# Patient Record
Sex: Male | Born: 1950 | Race: White | Hispanic: No | Smoking: Former smoker
Health system: Southern US, Community
[De-identification: ages and names within clinical notes are randomized; demographics above are authoritative.]

## PROBLEM LIST (undated history)

## (undated) DIAGNOSIS — E119 Type 2 diabetes mellitus without complications: Secondary | ICD-10-CM

## (undated) DIAGNOSIS — M109 Gout, unspecified: Secondary | ICD-10-CM

## (undated) DIAGNOSIS — M1990 Unspecified osteoarthritis, unspecified site: Secondary | ICD-10-CM

## (undated) DIAGNOSIS — K219 Gastro-esophageal reflux disease without esophagitis: Secondary | ICD-10-CM

## (undated) DIAGNOSIS — J45909 Unspecified asthma, uncomplicated: Secondary | ICD-10-CM

## (undated) DIAGNOSIS — I2510 Atherosclerotic heart disease of native coronary artery without angina pectoris: Secondary | ICD-10-CM

## (undated) DIAGNOSIS — M25511 Pain in right shoulder: Secondary | ICD-10-CM

## (undated) DIAGNOSIS — I219 Acute myocardial infarction, unspecified: Secondary | ICD-10-CM

## (undated) DIAGNOSIS — I10 Essential (primary) hypertension: Secondary | ICD-10-CM

## (undated) DIAGNOSIS — E785 Hyperlipidemia, unspecified: Secondary | ICD-10-CM

## (undated) DIAGNOSIS — I519 Heart disease, unspecified: Secondary | ICD-10-CM

## (undated) DIAGNOSIS — I255 Ischemic cardiomyopathy: Secondary | ICD-10-CM

## (undated) HISTORY — DX: Type 2 diabetes mellitus without complications: E11.9

## (undated) HISTORY — DX: Ischemic cardiomyopathy: I25.5

## (undated) HISTORY — PX: OTHER SURGICAL HISTORY: SHX169

## (undated) HISTORY — DX: Heart disease, unspecified: I51.9

## (undated) HISTORY — DX: Essential (primary) hypertension: I10

## (undated) HISTORY — DX: Pain in right shoulder: M25.511

## (undated) HISTORY — PX: CORONARY STENT PLACEMENT: SHX1402

---

## 1998-12-03 ENCOUNTER — Emergency Department (HOSPITAL_COMMUNITY): Admission: EM | Admit: 1998-12-03 | Discharge: 1998-12-03 | Payer: Self-pay | Admitting: Emergency Medicine

## 1999-07-02 ENCOUNTER — Encounter: Payer: Self-pay | Admitting: *Deleted

## 1999-07-03 ENCOUNTER — Inpatient Hospital Stay (HOSPITAL_COMMUNITY): Admission: EM | Admit: 1999-07-03 | Discharge: 1999-07-06 | Payer: Self-pay | Admitting: *Deleted

## 2000-04-07 ENCOUNTER — Emergency Department (HOSPITAL_COMMUNITY): Admission: EM | Admit: 2000-04-07 | Discharge: 2000-04-07 | Payer: Self-pay | Admitting: Emergency Medicine

## 2000-04-07 ENCOUNTER — Encounter: Payer: Self-pay | Admitting: Emergency Medicine

## 2000-05-23 ENCOUNTER — Inpatient Hospital Stay (HOSPITAL_COMMUNITY): Admission: EM | Admit: 2000-05-23 | Discharge: 2000-05-24 | Payer: Self-pay | Admitting: Emergency Medicine

## 2000-05-23 ENCOUNTER — Encounter: Payer: Self-pay | Admitting: Emergency Medicine

## 2000-05-24 ENCOUNTER — Encounter: Payer: Self-pay | Admitting: Cardiology

## 2001-03-16 ENCOUNTER — Emergency Department (HOSPITAL_COMMUNITY): Admission: EM | Admit: 2001-03-16 | Discharge: 2001-03-16 | Payer: Self-pay | Admitting: Emergency Medicine

## 2001-08-23 ENCOUNTER — Ambulatory Visit (HOSPITAL_COMMUNITY): Admission: RE | Admit: 2001-08-23 | Discharge: 2001-08-23 | Payer: Self-pay | Admitting: Cardiology

## 2001-08-23 ENCOUNTER — Encounter: Payer: Self-pay | Admitting: Cardiology

## 2003-09-11 ENCOUNTER — Emergency Department (HOSPITAL_COMMUNITY): Admission: EM | Admit: 2003-09-11 | Discharge: 2003-09-11 | Payer: Self-pay | Admitting: Emergency Medicine

## 2003-11-06 ENCOUNTER — Observation Stay (HOSPITAL_COMMUNITY): Admission: EM | Admit: 2003-11-06 | Discharge: 2003-11-07 | Payer: Self-pay | Admitting: Emergency Medicine

## 2003-12-31 ENCOUNTER — Ambulatory Visit (HOSPITAL_COMMUNITY): Admission: RE | Admit: 2003-12-31 | Discharge: 2003-12-31 | Payer: Self-pay | Admitting: Cardiology

## 2004-04-01 ENCOUNTER — Emergency Department (HOSPITAL_COMMUNITY): Admission: EM | Admit: 2004-04-01 | Discharge: 2004-04-02 | Payer: Self-pay | Admitting: Emergency Medicine

## 2006-06-24 ENCOUNTER — Emergency Department (HOSPITAL_COMMUNITY): Admission: EM | Admit: 2006-06-24 | Discharge: 2006-06-24 | Payer: Self-pay | Admitting: Emergency Medicine

## 2008-03-20 ENCOUNTER — Emergency Department (HOSPITAL_COMMUNITY): Admission: EM | Admit: 2008-03-20 | Discharge: 2008-03-20 | Payer: Self-pay | Admitting: Emergency Medicine

## 2008-03-28 ENCOUNTER — Ambulatory Visit (HOSPITAL_COMMUNITY): Admission: RE | Admit: 2008-03-28 | Discharge: 2008-03-28 | Payer: Self-pay | Admitting: Chiropractic Medicine

## 2008-07-21 ENCOUNTER — Ambulatory Visit (HOSPITAL_COMMUNITY): Admission: RE | Admit: 2008-07-21 | Discharge: 2008-07-21 | Payer: Self-pay | Admitting: Cardiology

## 2010-04-27 ENCOUNTER — Encounter (INDEPENDENT_AMBULATORY_CARE_PROVIDER_SITE_OTHER): Payer: Self-pay | Admitting: *Deleted

## 2010-05-26 ENCOUNTER — Encounter (INDEPENDENT_AMBULATORY_CARE_PROVIDER_SITE_OTHER): Payer: Self-pay | Admitting: *Deleted

## 2010-11-16 NOTE — Letter (Signed)
Summary: Previsit letter  [facility]  [address] [place], [place] [address]   Phone: [phone]  Fax: [phone]       04/27/2010 MRN: [id number]  [facility] [address] [place], [place]  [address]  Dear Mr. Peter Booker,  Welcome to the Gastroenterology Division at [facility].    You are scheduled to see a nurse for your pre-procedure visit on 05-26-10 at 3:30P.M. on the 3rd floor at [facility], [address]  We ask that you try to arrive at our office 15 minutes prior to your appointment time to allow for check-in.  Your nurse visit will consist of discussing your medical and surgical history, your immediate family medical history, and your medications.    Please bring a complete list of all your medications or, if you prefer, bring the medication bottles and we will list them.  We will need to be aware of both prescribed and over the counter drugs.  We will need to know exact dosage information as well.  If you are on blood thinners (Coumadin, Plavix, Aggrenox, Ticlid, etc.) please call our office today/prior to your appointment, as we need to consult with your physician about holding your medication.   Please be prepared to read and sign documents such as consent forms, a financial agreement, and acknowledgement forms.  If necessary, and with your consent, a friend or relative is welcome to sit-in on the nurse visit with you.  Please bring your insurance card so that we may make a copy of it.  If your insurance requires a referral to see a specialist, please bring your referral form from your primary care physician.  No co-pay is required for this nurse visit.     If you cannot keep your appointment, please call [phone] to cancel or reschedule prior to your appointment date.  This allows [place] the opportunity to schedule an appointment for another patient in need of care.    Thank you for choosing [facility] for your medical  needs.  We appreciate the opportunity to care for you.  Please visit [place] at our website  to learn more about our practice.                     Sincerely.                                                                                                                   The Gastroenterology Division

## 2010-11-16 NOTE — Letter (Signed)
Summary: Pre Visit No Show Letter  [facility]  [address] [place], [place] [address]   Phone: [phone]  Fax: [phone]        May 26, 2010 MRN: [id number]    [facility] [address] [place], [place]  [address]    Dear Mr. SHULER,   We have been unable to reach you by phone concerning the pre-procedure visit that you missed on 05/26/2010. For this reason,your procedure scheduled on 06/09/2010 has been cancelled. Our scheduling staff will gladly assist you with rescheduling your appointments at a more convenient time. Please call our office at [phone] between the hours of 8:00am and 5:00pm, press option #2 to reach an appointment scheduler. Please consider updating your contact numbers at this time so that we can reach you by phone in the future with schedule changes or results.    Thank you,    Ezra Sites RN [facility]

## 2010-12-29 ENCOUNTER — Emergency Department (HOSPITAL_BASED_OUTPATIENT_CLINIC_OR_DEPARTMENT_OTHER)
Admission: EM | Admit: 2010-12-29 | Discharge: 2010-12-30 | Disposition: A | Discharge status: Other Acute Inpatient Hospital | Payer: Medicaid Other | Attending: Emergency Medicine | Admitting: Emergency Medicine

## 2010-12-29 ENCOUNTER — Emergency Department (HOSPITAL_COMMUNITY)
Admission: EM | Admit: 2010-12-29 | Discharge: 2010-12-29 | Disposition: A | Discharge status: Home / Self Care | Payer: Medicaid Other | Attending: Emergency Medicine | Admitting: Emergency Medicine

## 2010-12-29 ENCOUNTER — Emergency Department (HOSPITAL_COMMUNITY): Payer: Medicaid Other

## 2010-12-29 ENCOUNTER — Emergency Department (INDEPENDENT_AMBULATORY_CARE_PROVIDER_SITE_OTHER): Payer: Medicaid Other

## 2010-12-29 DIAGNOSIS — R209 Unspecified disturbances of skin sensation: Secondary | ICD-10-CM | POA: Insufficient documentation

## 2010-12-29 DIAGNOSIS — R55 Syncope and collapse: Secondary | ICD-10-CM | POA: Insufficient documentation

## 2010-12-29 DIAGNOSIS — Z79899 Other long term (current) drug therapy: Secondary | ICD-10-CM | POA: Insufficient documentation

## 2010-12-29 DIAGNOSIS — M545 Low back pain, unspecified: Secondary | ICD-10-CM | POA: Insufficient documentation

## 2010-12-29 DIAGNOSIS — E7800 Pure hypercholesterolemia, unspecified: Secondary | ICD-10-CM | POA: Insufficient documentation

## 2010-12-29 DIAGNOSIS — F17200 Nicotine dependence, unspecified, uncomplicated: Secondary | ICD-10-CM

## 2010-12-29 DIAGNOSIS — Z9861 Coronary angioplasty status: Secondary | ICD-10-CM

## 2010-12-29 DIAGNOSIS — I2510 Atherosclerotic heart disease of native coronary artery without angina pectoris: Secondary | ICD-10-CM | POA: Insufficient documentation

## 2010-12-29 DIAGNOSIS — M542 Cervicalgia: Secondary | ICD-10-CM | POA: Insufficient documentation

## 2010-12-29 DIAGNOSIS — R112 Nausea with vomiting, unspecified: Secondary | ICD-10-CM | POA: Insufficient documentation

## 2010-12-29 LAB — CBC
Hemoglobin: 15 g/dL (ref 13.0–17.0)
MCH: 33.3 pg (ref 26.0–34.0)
MCHC: 36.5 g/dL — ABNORMAL HIGH (ref 30.0–36.0)
Platelets: 271 10*3/uL (ref 150–400)

## 2010-12-29 LAB — BASIC METABOLIC PANEL
CO2: 22 mEq/L (ref 19–32)
Calcium: 9.3 mg/dL (ref 8.4–10.5)
Creatinine, Ser: 0.8 mg/dL (ref 0.4–1.5)
GFR calc Af Amer: >60 mL/min (ref >60)

## 2010-12-29 LAB — PROTIME-INR: INR: 0.98 (ref 0.00–1.49)

## 2010-12-29 LAB — POCT CARDIAC MARKERS: Myoglobin, poc: 50.6 ng/mL (ref 12–200)

## 2010-12-30 ENCOUNTER — Inpatient Hospital Stay (HOSPITAL_COMMUNITY): Payer: Medicaid Other

## 2010-12-30 ENCOUNTER — Inpatient Hospital Stay (HOSPITAL_COMMUNITY)
Admission: AD | Admit: 2010-12-30 | Discharge: 2010-12-31 | DRG: 303 | Disposition: A | Discharge status: Home / Self Care | Payer: Medicaid Other | Source: Other Acute Inpatient Hospital | Attending: Cardiology | Admitting: Cardiology

## 2010-12-30 DIAGNOSIS — I2510 Atherosclerotic heart disease of native coronary artery without angina pectoris: Principal | ICD-10-CM | POA: Diagnosis present

## 2010-12-30 DIAGNOSIS — E7800 Pure hypercholesterolemia, unspecified: Secondary | ICD-10-CM | POA: Diagnosis present

## 2010-12-30 DIAGNOSIS — R7309 Other abnormal glucose: Secondary | ICD-10-CM | POA: Diagnosis present

## 2010-12-30 DIAGNOSIS — Z7982 Long term (current) use of aspirin: Secondary | ICD-10-CM

## 2010-12-30 DIAGNOSIS — Z982 Presence of cerebrospinal fluid drainage device: Secondary | ICD-10-CM

## 2010-12-30 DIAGNOSIS — Z8249 Family history of ischemic heart disease and other diseases of the circulatory system: Secondary | ICD-10-CM

## 2010-12-30 DIAGNOSIS — F17200 Nicotine dependence, unspecified, uncomplicated: Secondary | ICD-10-CM | POA: Diagnosis present

## 2010-12-30 LAB — CARDIAC PANEL(CRET KIN+CKTOT+MB+TROPI)
CK, MB: 2.9 ng/mL (ref 0.3–4.0)
Relative Index: INVALID (ref 0.0–2.5)
Relative Index: 2.9 — ABNORMAL HIGH (ref 0.0–2.5)
Total CK: 112 U/L (ref 7–232)
Troponin I: 0.02 ng/mL (ref 0.00–0.06)
Troponin I: 0.01 ng/mL (ref 0.00–0.06)

## 2010-12-30 LAB — APTT: aPTT: 50 seconds — ABNORMAL HIGH (ref 24–37)

## 2010-12-30 LAB — CBC
HCT: 41.8 % (ref 39.0–52.0)
Hemoglobin: 14.3 g/dL (ref 13.0–17.0)
MCHC: 34.2 g/dL (ref 30.0–36.0)
MCV: 96.1 fL (ref 78.0–100.0)
Platelets: 234 10*3/uL (ref 150–400)
RBC: 3.89 MIL/uL — ABNORMAL LOW (ref 4.22–5.81)
RBC: 4.33 MIL/uL (ref 4.22–5.81)
WBC: 10.1 10*3/uL (ref 4.0–10.5)
WBC: 10.1 10*3/uL (ref 4.0–10.5)

## 2010-12-30 LAB — GLUCOSE, CAPILLARY
Glucose-Capillary: 132 mg/dL — ABNORMAL HIGH (ref 70–99)
Glucose-Capillary: 117 mg/dL — ABNORMAL HIGH (ref 70–99)

## 2010-12-30 LAB — PROTIME-INR
INR: 1.09 (ref 0.00–1.49)
Prothrombin Time: 14.3 seconds (ref 11.6–15.2)

## 2010-12-30 LAB — COMPREHENSIVE METABOLIC PANEL
BUN: 15 mg/dL (ref 6–23)
CO2: 24 mEq/L (ref 19–32)
Calcium: 8.2 mg/dL — ABNORMAL LOW (ref 8.4–10.5)
Creatinine, Ser: 0.74 mg/dL (ref 0.4–1.5)
GFR calc non Af Amer: >60 mL/min (ref >60)
Glucose, Bld: 117 mg/dL — ABNORMAL HIGH (ref 70–99)
Total Bilirubin: 0.4 mg/dL (ref 0.3–1.2)

## 2010-12-30 LAB — LIPID PANEL
Cholesterol: 148 mg/dL (ref 0–200)
HDL: 48 mg/dL (ref >39)
Triglycerides: 115 mg/dL (ref <150)

## 2010-12-31 ENCOUNTER — Inpatient Hospital Stay (HOSPITAL_COMMUNITY): Payer: Medicaid Other

## 2010-12-31 LAB — GLUCOSE, CAPILLARY
Glucose-Capillary: 104 mg/dL — ABNORMAL HIGH (ref 70–99)
Glucose-Capillary: 122 mg/dL — ABNORMAL HIGH (ref 70–99)

## 2010-12-31 LAB — HEPARIN LEVEL (UNFRACTIONATED): Heparin Unfractionated: 0.46 IU/mL (ref 0.30–0.70)

## 2010-12-31 LAB — BASIC METABOLIC PANEL
Chloride: 109 mEq/L (ref 96–112)
Creatinine, Ser: 0.81 mg/dL (ref 0.4–1.5)
GFR calc Af Amer: >60 mL/min (ref >60)
GFR calc non Af Amer: >60 mL/min (ref >60)
Potassium: 3.7 mEq/L (ref 3.5–5.1)

## 2010-12-31 LAB — CBC
Hemoglobin: 14.1 g/dL (ref 13.0–17.0)
Platelets: 232 10*3/uL (ref 150–400)
RBC: 4.28 MIL/uL (ref 4.22–5.81)

## 2010-12-31 LAB — HEMOGLOBIN A1C: Mean Plasma Glucose: 128 mg/dL — ABNORMAL HIGH (ref <117)

## 2010-12-31 MED ORDER — TECHNETIUM TC 99M TETROFOSMIN IV KIT
10.0000 | PACK | Freq: Once | INTRAVENOUS | Status: AC | PRN
  Administered 2010-12-31: 10 via INTRAVENOUS

## 2010-12-31 MED ORDER — TECHNETIUM TC 99M TETROFOSMIN IV KIT
30.0000 | PACK | Freq: Once | INTRAVENOUS | Status: AC | PRN
  Administered 2010-12-31: 30 via INTRAVENOUS

## 2011-01-01 ENCOUNTER — Other Ambulatory Visit (HOSPITAL_COMMUNITY): Payer: Self-pay

## 2011-01-20 NOTE — Discharge Summary (Signed)
Peter Booker, Peter Booker            ACCOUNT NO.:  [id number]  MEDICAL RECORD NO.:  [id number]           PATIENT TYPE:  I  LOCATION:  4703                         FACILITY:  [facility]  PHYSICIAN:  Omari Mcmanaway N. Sharyn Booker, M.D. DATE OF BIRTH:  1950/12/24  DATE OF ADMISSION:  12/30/2010 DATE OF DISCHARGE:  12/31/2010                              DISCHARGE SUMMARY   ADMITTING DIAGNOSES: 1. Status post weakness. 2. Diaphoresis. 3. Arm pain. 4. Rule out coronary insufficiency. 5. Coronary artery disease. 6. History of percutaneous coronary intervention in the past. 7. Glucose intolerance. 8. Tobacco abuse. 9. Positive family history of coronary artery disease. 10.Hypercholesteremia.  DISCHARGE DIAGNOSES: 1. Coronary artery disease, stable, negative Persantine Myoview. 2. History of percutaneous coronary intervention to left anterior     descending and diagonal 1 in the past. 3. Glucose intolerance. 4. Tobacco abuse. 5. Positive family history of coronary artery disease. 6. Hypercholesteremia.  DISCHARGE HOME MEDICATIONS: 1. Enteric-coated aspirin 81 mg 1 tablet daily. 2. Toprol-XL 25 mg 1 tablet daily. 3. Crestor 20 mg 1 tablet daily. 4. Nitrostat 0.4 mg sublingual use as directed.  DIET:  Low salt, low cholesterol.  The patient has been advised to avoid sweets.  ACTIVITY:  As tolerated.  FOLLOWUP:  With me in 1 week.  CONDITION AT DISCHARGE:  Stable.  The patient has been advised to refrain from smoking.  BRIEF HISTORY AND HOSPITAL COURSE:  Peter Booker is a 60 year old white male with past medical history significant for coronary artery disease, history of PCI approximately 10 years ago to LAD and diagonal 1, hypercholesteremia, tobacco abuse, and strong family history of coronary artery disease who was transferred from [facility].  The patient states after washing car he felt very weak and broke into sweats associated with both arm tingling and near syncopal  episode.  Denies any chest pain, nausea, or vomiting.  Denies shortness of breath.  Denies palpitation prior to near syncopal episode.  Also complains of neck and lower back pain.  Denies any exertional chest pain.  The patient is noncompliant to meds and followup, not seen in my office in the last few years.  Presently, denies any complaints.  Denies any fever, chills, or cough.  PAST MEDICAL HISTORY:  As above.  PAST SURGICAL HISTORY:  He had shoulder surgery in the past.  ALLERGIES:  No known drug allergies.  MEDICATION:  He takes aspirin on a p.r.n. basis, Nexium and Xanax occasionally.  SOCIAL HISTORY:  He is single, 3 children.  Smoked 1 pack per day for 40+ years and now few cigarettes per day.  No history of alcohol abuse. Worked as a Music therapist in the past.  FAMILY HISTORY:  Positive for coronary artery disease.  PHYSICAL EXAMINATION:  GENERAL:  He is alert, awake, and oriented x3, in no acute distress. VITAL SIGNS:  Blood pressure was 117/70.  Pulse was 66 and regular. EYES:  Conjunctivae were pink. NECK:  Supple.  No JVD.  No bruit. LUNGS:  Clear to auscultation without rhonchi or rales. CEREBROVASCULAR:  S1 and S2 was normal.  There was no S3 or S4 gallop. ABDOMEN:  Soft.  Bowel sounds were present.  Nontender. EXTREMITIES:  There was no clubbing, cyanosis, or edema.  LABORATORY DATA:  Hemoglobin was 15, hematocrit 41.1, and white count of 16.3.  Potassium was 4.0, BUN 22, creatinine 0.8, and glucose was 105. Three sets of cardiac enzymes were normal.  Hemoglobin A1c was 6.1. Cholesterol was 148, LDL 77, HDL 48, and triglycerides 161.  EKG showed normal sinus rhythm with no acute ischemic changes.  BRIEF HOSPITAL COURSE:  The patient was admitted to Telemetry Unit.  MI was ruled out by serial enzymes and EKG.  The patient did not have any episodes of chest pain during the hospital stay.  The patient subsequently underwent Persantine Myoview which showed no  evidence of ischemia with EF of 53%.  The patient has been ambulating in the hallway without any problems and eager to go home and wanted to sign out AMA. The patient will be discharged home on above medications and will be followed up in my office in 1 week.     Peter Booker, M.D.     M[facility]/MEDQ  D:  12/31/2010  T:  01/01/2011  Job:  [id number]  Electronically Signed by Rinaldo Cloud M.D. on 01/20/2011 10:32:39 PM

## 2011-03-04 NOTE — Op Note (Signed)
Peter Booker, Peter Booker                        ACCOUNT NO.:  [id number]   MEDICAL RECORD NO.:  [id number]                   PATIENT TYPE:  INP   LOCATION:  6731                                 FACILITY:  [facility]   PHYSICIAN:  Dionne Ano. Everlene Other, M.D.         DATE OF BIRTH:  23-Nov-1950   DATE OF PROCEDURE:  11/06/2003  DATE OF DISCHARGE:                                 OPERATIVE REPORT   PREOPERATIVE DIAGNOSES:  1. Status post mutilating table saw injury to the left hand including open     fracture and tendon injury to the left index finger with large soft     tissue avulsion.  2. 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  3. Laceration to the tip of the ring finger with avulsed tissue and no bony     fracture.   POSTOPERATIVE DIAGNOSES:  1. Status post mutilating table saw injury to the left hand including open     fracture and tendon injury to the left index finger with large soft     tissue avulsion.  2. 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  3. Laceration to the tip of the ring finger with avulsed tissue and no bony     fracture.   OPERATION PERFORMED:  1. Irrigation and debridement skin and subcutaneous tissue left ring finger     secondary to table saw injury with repair.  2. Irrigation and debridement skin and subcutaneous tissue bone tendon     tissue secondary to open fracture left middle finger.  3. Revision amputation left middle finger with bilateral neurectomies.  4. Irrigation and debridement skin and subcutaneous tissue bone and tendon     left index finger.  5. Repair flexor digitorum profundus on left index finger.  6. Repair soft tissue left index finger complex in nature.  7. Open treatment distal phalanx fracture, left index finger.   SURGEON:  Dionne Ano. Amanda Pea, M.D.   ASSISTANT:  None.   ANESTHESIA:  General.   COMPLICATIONS:  None.   TOURNIQUET TIME:  Less than an hour.   INDICATIONS FOR PROCEDURE:  The patient is a 60 year old male who sustained  a table injury earlier today.  He was seen in the emergency room.  I was  asked to see him emergently as a hand activated trauma by the emergency  department staff.  Once seen and evaluated, he was prepped emergently for  surgery and was taken to the operative suite after thorough risks and  benefits were discussed.  He had a distal tip which was in fairly poor  repair.  We did bring this up for a look to see if the patient was a  candidate for any replantation.  It was in poor condition and he was not.  I  discussed the risks  and benefits of surgery including risks of infection,  bleeding, anesthesia, damage to normal structures and failure of surgery to  accomplish its intended goals of relieving symptoms and restoring function.  With all issues in mind, he desired to proceed.   DESCRIPTION OF PROCEDURE:  The patient seen by myself and anesthesia, taken  to the operative suite, underwent a smooth induction of general anesthesia.  Prior to this, he was given preoperative antibiotics.  Once in the operative  suite, we placed a tourniquet around the left upper extremity.  Following  this, the patient was prepped and draped in the usual sterile fashion about  the left upper extremity with the tourniquet up as there was heavy bleeding.  Once this was done, the patient underwent incision and drainage of skin and  subcutaneous tissue left ring finger secondary to table saw injury.  I left  part of the area open and I&D'd this copiously.  There was no bony fragments  or other problems.  Following this, I&D of skin, subcutaneous tissue and  bone was accomplished about the left middle finger where a mutilating injury  had occurred.  This was an amputated area.  The distal tip was inspected and  noted to be very poorly fit for any replantation.  Thus we  performed  excisional debridement of prenecrotic tissue and copious irrigation.  Following this, I performed I&D of skin and subcutaneous tissue, bone and  tendon of the left index finger. The patient had a FDP laceration.  He also  had a bony fracture which was not completely destabilizing about the distal  phalanx.  All of these areas were I&D'd without difficulty.  Following this,  we performed a revision amputation of the left middle finger.   The left middle finger underwent revision amputation including bilaterally  neurectomies.  The neurovascular bundles were identified.  Crushing and  cauterization technique was performed about the bilateral proper digital  nerves radially and ulnarly and these were allowed to retract proximally.  I  removed an nonviable tissue cauterized the ends of the vessels both radially  and ulnarly and following this, repaired the soft tissue without difficulty.  The soft tissue was repaired to my satisfaction without difficulty and once  this was done the patient then had closure of the wound.  I did scope this  and revise the amputation without difficulty performing primary closure with  4-0 Prolene and 5-0 chromic suture.  Once this was done, attention was  turned toward the index finger where FDP zone 1 repair was accomplished with  4-0 FiberWire and a 4.7 magnification.  Following this, open fracture  treatment was performed about the distal phalanx and complex soft tissue  repair was performed with a combination of chromic and Prolene suture.  I  did leave the area loose and checked the area for refill.  The tourniquet  was deflated for revision neurectomy and revision amputation as well as the  repairs of the tendon and bone and soft tissue to the index finger.  The  patient did have good refill about these areas and I was pleased with this. Following this, a sterile dressing was applied.  15 mL of Marcaine 0.25%  without epinephrine was placed in  the hand for postoperative analgesia and  sterile dressing was applied.  The patient tolerated the procedure well, was  then extubated and transferred to recovery room.  He will be elevated  monitored closely, will be placed on antibiotics and we will monitor  his  progress carefully.  I have discussed with he and his family all issues and  do's and don't's, etc.  All questions have  been encouraged and answered.                                               Dionne Ano. Everlene Other, M.D.    Nash Mantis  D:  11/06/2003  T:  11/07/2003  Job:  [id number]

## 2011-03-04 NOTE — Discharge Summary (Signed)
[facility]  Patient:    Peter Booker, Peter Booker                     MRN: [id number] Adm. Date:  [id number] Disc. Date: [id number] Attending:  Rinaldo Cloud N                           Discharge Summary  ADMISSION DIAGNOSES: 1. Recurrent chest pain.  Rule out myocardial infarction, rule out restenosis. 2. Coronary artery disease status post percutaneous transluminal coronary    angioplasty and stenting to left anterior descending artery in September    2000. 3. History of silent myocardial infarction in the past. 4. Hypercholesterolemia. 5. Tobacco abuse. 6. Positive family history of coronary artery disease.  FINAL DIAGNOSES: 1. Recurrent chest pain.  Myocardial infarction ruled out.  Ruled out    gastroesophageal reflux disease.  Negative stress Cardiolite. 2. Coronary artery disease status post percutaneous transluminal coronary    angioplasty and stenting to left anterior descending artery in September    2000. 3. History of silent myocardial infarction. 4. Hypercholesterolemia. 5. Tobacco abuse. 6. Positive family history of coronary artery disease.  DISCHARGE HOME MEDICATIONS: 1. Toprol XL 50 mg 1/2 tablet daily. 2. Enteric-coated aspirin 325 mg 1 tablet daily. 3. Zocor 40 mg 1 tablet daily at night. 4. Xanax 0.25 mg 1 tablet twice daily. 5. Nitrostat 0.4 mg sublingual, use as directed. 6. Protonix 40 mg 1 daily.  ACTIVITY:  As tolerated.  DIET:  Low salt, low cholesterol.  SPECIAL INSTRUCTIONS:  The patient has been advised to stop smoking.  FOLLOWUP: With me in 7 to 10 days.  CONDITION ON DISCHARGE:  Stable.  BRIEF HISTORY AND HOSPITAL COURSE:  The patient is a 60 year old white male with past medical history significant for coronary artery disease status post PTCA and stenting to LAD in September 2000, history of silent MI, hypercholesterolemia, tobacco abuse, strong family history of coronary artery disease.  He came to the ER  complaining of retrosternal and precordial throbbing chest pain rated at 7/10, radiating to right arm off and on.  It started yesterday afternoon.  He initially took 1 sublingual nitroglycerin with relief and then again had similar chest pain requiring 2 sublingual nitroglycerin with relief. Again last night, he had similar pain requiring 2 sublingual nitroglycerin with partial relief, so he decided to come to the ER.  EKG done in the ER showed no acute ischemic changes.  The patient denies any nausea, vomiting, diaphoresis, palpitations, lightheadedness, PND, orthopnea, leg swelling.  No relation of chest pain to breathing or movement.  No history of fever, chills, or cough.  PAST MEDICAL HISTORY:  As above.  PAST SURGICAL HISTORY:  He had bilateral shoulder surgery 20+ years ago.  ALLERGIES:  No known drug allergies.  MEDICATIONS: 1. Toprol XL 25 mg p.o. q.d. 2. Zocor 40 mg p.o. q.h.s. 3. Enteric-coated aspirin 325 mg p.o. q.d. 4. Xanax 0.25 mg p.o. b.i.d.  SOCIAL HISTORY:  He is single, works as Corporate investment banker, lives in [place].  He smoked one pack per day for 30+ years, now smokes two to three cigarettes per day and drinks socially.  No history of drug abuse.  FAMILY HISTORY:  His father died of MI at the age of 63.  His mother died at the age of 55 due to CVA, and she was hypertensive.  He has six brother.  Four brothers had PTCA and stenting  to coronary arteries.  Two brothers have had open heart surgery.  One sister died of lymphatic cancer.  One sister died of hepatic failure secondary to pseudocyst of liver.  PHYSICAL EXAMINATION:  GENERAL:  Alert, awake, oriented x 3, in no acute distress.  VITAL SIGNS:  Blood pressure 114/66, pulse 66.  HEENT:  Conjunctivae pink.  NECK:  Supple.  No JVD, no bruit.  LUNGS:  Clear to auscultation without rhonchi.  CARDIOVASCULAR:  S1, S2 normal.  No S3 gallop.  ABDOMEN:  Soft bowel sounds present,  nontender.  EXTREMITIES:  No clubbing, cyanosis, or edema.  LABORATORY DATA:  EKG showed normal sinus rhythm.  There were no acute ischemic changes.  Other labs showed hemoglobin 14.2, hematocrit 41.3, white count 3.6.  Two sets of CPKs were negative, and troponin I was negative.  BRIEF HOSPITAL COURSE:  The patient was admitted to telemetry unit.  MI was ruled out by serial enzymes and EKG.  The patient underwent Stress Cardiolite today, exercised for 9.40 minutes on Bruce protocol.  He complained of fatigue and shortness of breath.  No chest pain was reported.  Peak heart rate achieved was 137 which was 39% of maximum predicted heart rate.  Peak blood pressure was 160/90.  EKG showed normal sinus rhythm with minimal ST depression, less than 0.5 mm located in lateral leads.  No ______ were noted. Cardiolite scan showed no evidence of reversible ischemia, ejection fraction of 53%.  The patient did not have any episodes of chest pain during the hospital stay. The patient will be discharged home on the above medications, then will be followed up in my office within 7 to 10 days.  If the patient continues to have chest pain, we will get GI workup as outpatient. DD:  05/24/00 TD:  05/25/00 Job: [id number]

## 2011-07-04 ENCOUNTER — Emergency Department (HOSPITAL_COMMUNITY)
Admission: EM | Admit: 2011-07-04 | Discharge: 2011-07-04 | Disposition: A | Discharge status: Home / Self Care | Payer: Medicaid Other | Attending: Emergency Medicine | Admitting: Emergency Medicine

## 2011-07-04 ENCOUNTER — Emergency Department (HOSPITAL_COMMUNITY): Payer: Medicaid Other

## 2011-07-04 DIAGNOSIS — I10 Essential (primary) hypertension: Secondary | ICD-10-CM | POA: Insufficient documentation

## 2011-07-04 DIAGNOSIS — I2510 Atherosclerotic heart disease of native coronary artery without angina pectoris: Secondary | ICD-10-CM | POA: Insufficient documentation

## 2011-07-04 DIAGNOSIS — R42 Dizziness and giddiness: Secondary | ICD-10-CM | POA: Insufficient documentation

## 2011-07-04 DIAGNOSIS — R112 Nausea with vomiting, unspecified: Secondary | ICD-10-CM | POA: Insufficient documentation

## 2011-07-04 DIAGNOSIS — M79609 Pain in unspecified limb: Secondary | ICD-10-CM | POA: Insufficient documentation

## 2011-07-04 DIAGNOSIS — R0789 Other chest pain: Secondary | ICD-10-CM | POA: Insufficient documentation

## 2011-07-04 DIAGNOSIS — E7800 Pure hypercholesterolemia, unspecified: Secondary | ICD-10-CM | POA: Insufficient documentation

## 2011-07-04 LAB — DIFFERENTIAL
Eosinophils Absolute: 0.3 10*3/uL (ref 0.0–0.7)
Eosinophils Relative: 3 % (ref 0–5)
Lymphocytes Relative: 16 % (ref 12–46)
Lymphs Abs: 2 10*3/uL (ref 0.7–4.0)
Monocytes Absolute: 0.8 10*3/uL (ref 0.1–1.0)

## 2011-07-04 LAB — COMPREHENSIVE METABOLIC PANEL
BUN: 19 mg/dL (ref 6–23)
CO2: 24 mEq/L (ref 19–32)
Calcium: 9.2 mg/dL (ref 8.4–10.5)
Creatinine, Ser: 0.8 mg/dL (ref 0.50–1.35)
GFR calc Af Amer: >60 mL/min (ref >60)
GFR calc non Af Amer: >60 mL/min (ref >60)
Glucose, Bld: 124 mg/dL — ABNORMAL HIGH (ref 70–99)
Sodium: 139 mEq/L (ref 135–145)
Total Protein: 6.8 g/dL (ref 6.0–8.3)

## 2011-07-04 LAB — CBC
HCT: 46.8 % (ref 39.0–52.0)
MCHC: 35.3 g/dL (ref 30.0–36.0)
MCV: 96.1 fL (ref 78.0–100.0)
Platelets: 226 10*3/uL (ref 150–400)
RDW: 13.9 % (ref 11.5–15.5)
WBC: 12.5 10*3/uL — ABNORMAL HIGH (ref 4.0–10.5)

## 2011-07-04 LAB — CK TOTAL AND CKMB (NOT AT ARMC)
CK, MB: 3.6 ng/mL (ref 0.3–4.0)
Relative Index: 3.2 — ABNORMAL HIGH (ref 0.0–2.5)
Total CK: 111 U/L (ref 7–232)

## 2011-11-19 ENCOUNTER — Other Ambulatory Visit: Payer: Self-pay | Admitting: Cardiology

## 2012-10-15 ENCOUNTER — Emergency Department (HOSPITAL_COMMUNITY)
Admission: EM | Admit: 2012-10-15 | Discharge: 2012-10-15 | Disposition: A | Discharge status: Home / Self Care | Payer: Medicaid Other | Attending: Emergency Medicine | Admitting: Emergency Medicine

## 2012-10-15 ENCOUNTER — Encounter (HOSPITAL_COMMUNITY): Payer: Self-pay | Admitting: Emergency Medicine

## 2012-10-15 DIAGNOSIS — R209 Unspecified disturbances of skin sensation: Secondary | ICD-10-CM | POA: Insufficient documentation

## 2012-10-15 DIAGNOSIS — M47812 Spondylosis without myelopathy or radiculopathy, cervical region: Secondary | ICD-10-CM | POA: Insufficient documentation

## 2012-10-15 DIAGNOSIS — M25519 Pain in unspecified shoulder: Secondary | ICD-10-CM | POA: Insufficient documentation

## 2012-10-15 DIAGNOSIS — Z7982 Long term (current) use of aspirin: Secondary | ICD-10-CM | POA: Insufficient documentation

## 2012-10-15 DIAGNOSIS — K219 Gastro-esophageal reflux disease without esophagitis: Secondary | ICD-10-CM | POA: Insufficient documentation

## 2012-10-15 DIAGNOSIS — Z79899 Other long term (current) drug therapy: Secondary | ICD-10-CM | POA: Insufficient documentation

## 2012-10-15 DIAGNOSIS — F17200 Nicotine dependence, unspecified, uncomplicated: Secondary | ICD-10-CM | POA: Insufficient documentation

## 2012-10-15 DIAGNOSIS — I2510 Atherosclerotic heart disease of native coronary artery without angina pectoris: Secondary | ICD-10-CM | POA: Insufficient documentation

## 2012-10-15 HISTORY — DX: Gastro-esophageal reflux disease without esophagitis: K21.9

## 2012-10-15 HISTORY — DX: Atherosclerotic heart disease of native coronary artery without angina pectoris: I25.10

## 2012-10-15 MED ORDER — CYCLOBENZAPRINE HCL 10 MG PO TABS: 10.0000 mg | ORAL_TABLET | Freq: Two times a day (BID) | ORAL | Status: DC | PRN

## 2012-10-15 MED ORDER — IBUPROFEN 600 MG PO TABS: 600.0000 mg | ORAL_TABLET | Freq: Four times a day (QID) | ORAL | Status: DC | PRN

## 2012-10-15 MED ORDER — OXYCODONE-ACETAMINOPHEN 7.5-500 MG PO TABS: 1.0000 | ORAL_TABLET | ORAL | Status: DC | PRN

## 2012-10-15 NOTE — ED Provider Notes (Signed)
History   This chart was scribed for Nelia Shi, MD, by Frederik Pear, ER scribe. The patient was seen in room TR05C/TR05C and the patient's care was started at 0835.    CSN: [id number]  Arrival date & time 10/15/12  4098   None     No chief complaint on file.    HPI Peter MIERZEJEWSKI Sr. is a 61 y.o. male who presents to the Emergency Department complaining of constant, gradually, 4/10 bilateral neck and shoulder pain with associated parethesia down his back and down his arms to both hands that is aggravated by movement and improved by some positions and began last night. He states that he had a similar episode several months ago that resolved on its own. He states that he took 1 Tylenol prior to arrival with no relief.   Past Medical History  Diagnosis Date  . Coronary artery disease   . GERD (gastroesophageal reflux disease)     Past Surgical History  Procedure Date  . Coronary stent placement   . Bilateral shoulder arthroscopy     No family history on file.  History  Substance Use Topics  . Smoking status: Current Every Day Smoker  . Smokeless tobacco: Not on file  . Alcohol Use: No      Review of Systems A complete 10 system review of systems was obtained and all systems are negative except as noted in the HPI and PMH.  Allergies  Review of patient's allergies indicates no known allergies.  Home Medications   Current Outpatient Rx  Name  Route  Sig  Dispense  Refill  . ALPRAZOLAM 0.25 MG PO TABS   Oral   Take 0.25 mg by mouth at bedtime as needed. To help sleep         . ASPIRIN EC 325 MG PO TBEC   Oral   Take 325 mg by mouth daily.         Marland Kitchen ESOMEPRAZOLE MAGNESIUM 40 MG PO CPDR   Oral   Take 40 mg by mouth daily as needed. For GERD         . CYCLOBENZAPRINE HCL 10 MG PO TABS   Oral   Take 1 tablet (10 mg total) by mouth 2 (two) times daily as needed for muscle spasms.   20 tablet   0   . IBUPROFEN 600 MG PO TABS   Oral   Take 1  tablet (600 mg total) by mouth every 6 (six) hours as needed for pain.   30 tablet   0   . OXYCODONE-ACETAMINOPHEN 7.5-500 MG PO TABS   Oral   Take 1 tablet by mouth every 4 (four) hours as needed for pain.   30 tablet   0     BP 130/83  Pulse 73  Temp 97.9 F (36.6 C) (Oral)  Resp 20  SpO2 96%  Physical Exam  Nursing note and vitals reviewed. Constitutional: He is oriented to person, place, and time. He appears well-developed and well-nourished. No distress.  HENT:  Head: Normocephalic and atraumatic.  Eyes: Pupils are equal, round, and reactive to light.  Neck: Normal range of motion. Muscular tenderness present.         Pain to palpation and movement as indicated  Cardiovascular: Normal rate and intact distal pulses.   Pulmonary/Chest: No respiratory distress.  Abdominal: Normal appearance. He exhibits no distension.  Musculoskeletal: Normal range of motion.  Neurological: He is alert and oriented to person, place, and time. No  cranial nerve deficit.  Skin: Skin is warm and dry. No rash noted.  Psychiatric: He has a normal mood and affect. His behavior is normal.    ED Course  Procedures (including critical care time) Review of CT scan cervical spine showed degenerative changes are present several years prior. DIAGNOSTIC STUDIES:   COORDINATION OF CARE:  08:50- Discussed planned course of treatment with the patient, including pain medication and muscle relaxers, who is agreeable at this time.   Labs Reviewed - No data to display No results found.   1. Cervical spine arthritis       MDM  I personally performed the services described in this documentation, which was scribed in my presence. The recorded information has been reviewed and considered.      Nelia Shi, MD 10/15/12 [phone]

## 2012-10-15 NOTE — ED Notes (Signed)
C/O neck and bilateral shoulder and arm pain. Onset last pm. States has tingling in both hands. Had similar episode 2 months ago that resolved spontaneously. No known injury.

## 2012-11-21 ENCOUNTER — Other Ambulatory Visit (HOSPITAL_COMMUNITY): Payer: Self-pay | Admitting: Cardiology

## 2012-11-21 DIAGNOSIS — R079 Chest pain, unspecified: Secondary | ICD-10-CM

## 2012-11-28 ENCOUNTER — Encounter (HOSPITAL_COMMUNITY): Payer: Medicaid Other

## 2012-11-28 ENCOUNTER — Encounter (HOSPITAL_COMMUNITY)
Admission: RE | Admit: 2012-11-28 | Discharge: 2012-11-28 | Disposition: A | Discharge status: Home / Self Care | Payer: Medicaid Other | Source: Ambulatory Visit | Attending: Cardiology | Admitting: Cardiology

## 2012-11-28 DIAGNOSIS — R079 Chest pain, unspecified: Secondary | ICD-10-CM | POA: Insufficient documentation

## 2012-11-28 DIAGNOSIS — I2510 Atherosclerotic heart disease of native coronary artery without angina pectoris: Secondary | ICD-10-CM | POA: Insufficient documentation

## 2012-12-03 ENCOUNTER — Encounter (HOSPITAL_COMMUNITY)
Admission: RE | Admit: 2012-12-03 | Discharge: 2012-12-03 | Disposition: A | Discharge status: Home / Self Care | Payer: Medicaid Other | Source: Ambulatory Visit | Attending: Cardiology | Admitting: Cardiology

## 2012-12-03 ENCOUNTER — Other Ambulatory Visit: Payer: Self-pay

## 2012-12-03 VITALS — BP 149/90 | HR 62

## 2012-12-03 DIAGNOSIS — R079 Chest pain, unspecified: Secondary | ICD-10-CM

## 2012-12-03 MED ORDER — REGADENOSON 0.4 MG/5ML IV SOLN
0.4000 mg | Freq: Once | INTRAVENOUS | Status: AC
  Administered 2012-12-03: 0.4 mg via INTRAVENOUS

## 2012-12-03 MED ORDER — TECHNETIUM TC 99M SESTAMIBI GENERIC - CARDIOLITE
10.0000 | Freq: Once | INTRAVENOUS | Status: AC | PRN
  Administered 2012-12-03: 10 via INTRAVENOUS

## 2012-12-03 MED ORDER — REGADENOSON 0.4 MG/5ML IV SOLN
INTRAVENOUS | Status: AC
  Filled 2012-12-03: qty 5

## 2012-12-03 MED ORDER — TECHNETIUM TC 99M SESTAMIBI GENERIC - CARDIOLITE
30.0000 | Freq: Once | INTRAVENOUS | Status: AC | PRN
  Administered 2012-12-03: 30 via INTRAVENOUS

## 2013-01-09 ENCOUNTER — Encounter (HOSPITAL_COMMUNITY): Payer: Self-pay | Admitting: *Deleted

## 2013-01-09 ENCOUNTER — Emergency Department (HOSPITAL_COMMUNITY): Payer: Medicaid Other

## 2013-01-09 ENCOUNTER — Emergency Department (HOSPITAL_COMMUNITY)
Admission: EM | Admit: 2013-01-09 | Discharge: 2013-01-09 | Disposition: A | Discharge status: Home / Self Care | Payer: Medicaid Other | Attending: Emergency Medicine | Admitting: Emergency Medicine

## 2013-01-09 DIAGNOSIS — I2510 Atherosclerotic heart disease of native coronary artery without angina pectoris: Secondary | ICD-10-CM | POA: Insufficient documentation

## 2013-01-09 DIAGNOSIS — J3489 Other specified disorders of nose and nasal sinuses: Secondary | ICD-10-CM | POA: Insufficient documentation

## 2013-01-09 DIAGNOSIS — Z9861 Coronary angioplasty status: Secondary | ICD-10-CM | POA: Insufficient documentation

## 2013-01-09 DIAGNOSIS — Z7982 Long term (current) use of aspirin: Secondary | ICD-10-CM | POA: Insufficient documentation

## 2013-01-09 DIAGNOSIS — R059 Cough, unspecified: Secondary | ICD-10-CM | POA: Insufficient documentation

## 2013-01-09 DIAGNOSIS — R0789 Other chest pain: Secondary | ICD-10-CM | POA: Insufficient documentation

## 2013-01-09 DIAGNOSIS — J40 Bronchitis, not specified as acute or chronic: Secondary | ICD-10-CM

## 2013-01-09 DIAGNOSIS — R05 Cough: Secondary | ICD-10-CM | POA: Insufficient documentation

## 2013-01-09 DIAGNOSIS — IMO0001 Reserved for inherently not codable concepts without codable children: Secondary | ICD-10-CM | POA: Insufficient documentation

## 2013-01-09 DIAGNOSIS — R52 Pain, unspecified: Secondary | ICD-10-CM | POA: Insufficient documentation

## 2013-01-09 DIAGNOSIS — R5381 Other malaise: Secondary | ICD-10-CM | POA: Insufficient documentation

## 2013-01-09 DIAGNOSIS — F17200 Nicotine dependence, unspecified, uncomplicated: Secondary | ICD-10-CM | POA: Insufficient documentation

## 2013-01-09 DIAGNOSIS — J209 Acute bronchitis, unspecified: Secondary | ICD-10-CM | POA: Insufficient documentation

## 2013-01-09 DIAGNOSIS — K219 Gastro-esophageal reflux disease without esophagitis: Secondary | ICD-10-CM | POA: Insufficient documentation

## 2013-01-09 DIAGNOSIS — R0982 Postnasal drip: Secondary | ICD-10-CM | POA: Insufficient documentation

## 2013-01-09 DIAGNOSIS — Z79899 Other long term (current) drug therapy: Secondary | ICD-10-CM | POA: Insufficient documentation

## 2013-01-09 LAB — POCT I-STAT, CHEM 8
BUN: 13 mg/dL (ref 6–23)
Calcium, Ion: 1.15 mmol/L (ref 1.13–1.30)
Chloride: 109 mEq/L (ref 96–112)
Creatinine, Ser: 0.9 mg/dL (ref 0.50–1.35)
TCO2: 25 mmol/L (ref 0–100)

## 2013-01-09 MED ORDER — GUAIFENESIN ER 600 MG PO TB12: 1200.0000 mg | ORAL_TABLET | Freq: Two times a day (BID) | ORAL | Status: DC

## 2013-01-09 MED ORDER — IBUPROFEN 800 MG PO TABS
800.0000 mg | ORAL_TABLET | Freq: Once | ORAL | Status: AC
  Administered 2013-01-09: 800 mg via ORAL
  Filled 2013-01-09: qty 1

## 2013-01-09 MED ORDER — AZITHROMYCIN 250 MG PO TABS: ORAL_TABLET | ORAL | Status: DC

## 2013-01-09 MED ORDER — HYDROCOD POLST-CHLORPHEN POLST 10-8 MG/5ML PO LQCR: 5.0000 mL | Freq: Two times a day (BID) | ORAL | Status: DC

## 2013-01-09 NOTE — ED Provider Notes (Signed)
Medical screening examination/treatment/procedure(s) were performed by non-physician practitioner and as supervising physician I was immediately available for consultation/collaboration.  Juliet Rude. Rubin Payor, MD 01/09/13 2355

## 2013-01-09 NOTE — ED Provider Notes (Signed)
History     CSN: [id number]  Arrival date & time 01/09/13  Barry Brunner   First MD Initiated Contact with Patient 01/09/13 2123      Chief Complaint  Patient presents with  . Sore Throat  . Generalized Body Aches   HPI  History provided by the patient. Patient is a 62 year old male with history of hypertension, CAD status post stents and GERD who presents with complaints of persistent cough, sore throat, body aches and headache. Patient states symptoms really began about 5 days ago but have been significantly worsening over the past few days. Coughing is primarily dry with occasional white productive phlegm. Patient does feel slightly congested with some postnasal drip but denies significant rhinorrhea. Today symptoms were associated with body aches and headache from significant coughing. He denies any neck pain or stiffness. There is also some chest discomfort diffusely through the front and back. He denies having significant chest pain. No heart palpitations. He has had normal by mouth intake. No nausea, vomiting or diarrhea symptoms. He does report a family member who did have similar cough and cold symptoms. Patient has used some NyQuil which has helped him sleep but no other medications for treatment. There are no other aggravating or alleviating factors. No other associated symptoms.    Past Medical History  Diagnosis Date  . Coronary artery disease   . GERD (gastroesophageal reflux disease)     Past Surgical History  Procedure Laterality Date  . Coronary stent placement    . Bilateral shoulder arthroscopy      History reviewed. No pertinent family history.  History  Substance Use Topics  . Smoking status: Current Every Day Smoker  . Smokeless tobacco: Not on file  . Alcohol Use: No      Review of Systems  Constitutional: Positive for fatigue. Negative for fever and chills.  HENT: Positive for congestion, sore throat and postnasal drip.   Respiratory: Positive for cough.  Negative for shortness of breath.   Gastrointestinal: Negative for nausea, vomiting, abdominal pain and diarrhea.  Musculoskeletal: Positive for myalgias.  All other systems reviewed and are negative.    Allergies  Review of patient's allergies indicates no known allergies.  Home Medications   Current Outpatient Rx  Name  Route  Sig  Dispense  Refill  . ALPRAZolam (XANAX) 0.25 MG tablet   Oral   Take 0.25 mg by mouth at bedtime as needed. To help sleep         . aspirin EC 325 MG tablet   Oral   Take 325 mg by mouth daily.         Marland Kitchen atorvastatin (LIPITOR) 10 MG tablet   Oral   Take 10 mg by mouth daily.         . cyclobenzaprine (FLEXERIL) 10 MG tablet   Oral   Take 1 tablet (10 mg total) by mouth 2 (two) times daily as needed for muscle spasms.   20 tablet   0   . esomeprazole (NEXIUM) 40 MG capsule   Oral   Take 40 mg by mouth daily as needed. For GERD         . oxyCODONE-acetaminophen (PERCOCET) 7.5-500 MG per tablet   Oral   Take 1 tablet by mouth every 4 (four) hours as needed for pain.   30 tablet   0     BP 138/94  Pulse 96  Temp(Src) 98.7 F (37.1 C)  SpO2 97%  Physical Exam  Nursing note and vitals  reviewed. Constitutional: He is oriented to person, place, and time. He appears well-developed and well-nourished. No distress.  HENT:  Head: Normocephalic.  Right Ear: Tympanic membrane normal.  Left Ear: Tympanic membrane normal.  Mouth/Throat: Oropharynx is clear and moist.  Thanks is erythematous without significant edema or exudate. Uvula midline. Tonsils normal.  Eyes: Conjunctivae are normal.  Neck: Normal range of motion. Neck supple.  Cardiovascular: Normal rate and regular rhythm.   Pulmonary/Chest: Effort normal and breath sounds normal. No stridor. No respiratory distress. He has no wheezes. He has no rales.  Abdominal: Soft.  Musculoskeletal: Normal range of motion. He exhibits no edema and no tenderness.  Lymphadenopathy:     He has no cervical adenopathy.  Neurological: He is alert and oriented to person, place, and time.  Skin: Skin is warm.  Psychiatric: He has a normal mood and affect. His behavior is normal.    ED Course  Procedures   Results for orders placed during the hospital encounter of 01/09/13  RAPID STREP SCREEN      Result Value Range   Streptococcus, Group A Screen (Direct) NEGATIVE  NEGATIVE  POCT I-STAT, CHEM 8      Result Value Range   Sodium 141  135 - 145 mEq/L   Potassium 4.2  3.5 - 5.1 mEq/L   Chloride 109  96 - 112 mEq/L   BUN 13  6 - 23 mg/dL   Creatinine, Ser 1.61  0.50 - 1.35 mg/dL   Glucose, Bld 096 (*) 70 - 99 mg/dL   Calcium, Ion 0.45  4.09 - 1.30 mmol/L   TCO2 25  0 - 100 mmol/L   Hemoglobin 15.3  13.0 - 17.0 g/dL   HCT 81.1  91.4 - 78.2 %      Dg Chest 2 View  01/09/2013  *RADIOLOGY REPORT*  Clinical Data: Cough and shortness of breath.  CHEST - 2 VIEW  Comparison: 07/04/2011.  Findings: The cardiac silhouette, mediastinal and hilar contours are normal and stable.  The lungs are clear.  No pleural effusion. The bony thorax is intact.  There are surgical changes involving both shoulders and a remote healed right rib fracture.  IMPRESSION: No acute cardiopulmonary findings.   Original Report Authenticated By: Rudie Meyer, M.D.      1. Bronchitis       MDM  9:20PM patient seen and evaluated. Patient laying in bed appears comfortable in no acute distress. Normal respirations and O2 sats. Normal heart rate. Patient afebrile. He does not appear severely ill or toxic.  Lab tests, strep throat and chest x-ray unremarkable. Lungs are clear on exam. Symptoms seem consistent with bronchitis. Patient is a continued smoker with known CAD. Will give prescriptions for Tussionex for cough as well as Z-Pak.  Patient seen and discussed with attending physician and agrees with plan.        Angus Seller, PA-C 01/09/13 2151

## 2013-01-09 NOTE — ED Notes (Signed)
Pt c/o sore throat, body aches, cough, and HA x 3 days.

## 2013-01-09 NOTE — ED Notes (Signed)
Pt sucked out a glass of water. Pt offered more water, but states he is fine. Pt able to take 800mg  of Ibuprofen as well.

## 2013-05-27 ENCOUNTER — Emergency Department (HOSPITAL_COMMUNITY)
Admission: EM | Admit: 2013-05-27 | Discharge: 2013-05-27 | Disposition: A | Discharge status: Home / Self Care | Payer: Medicaid Other | Attending: Emergency Medicine | Admitting: Emergency Medicine

## 2013-05-27 ENCOUNTER — Encounter (HOSPITAL_COMMUNITY): Payer: Self-pay | Admitting: Emergency Medicine

## 2013-05-27 DIAGNOSIS — F17200 Nicotine dependence, unspecified, uncomplicated: Secondary | ICD-10-CM | POA: Insufficient documentation

## 2013-05-27 DIAGNOSIS — M62838 Other muscle spasm: Secondary | ICD-10-CM | POA: Insufficient documentation

## 2013-05-27 DIAGNOSIS — K219 Gastro-esophageal reflux disease without esophagitis: Secondary | ICD-10-CM | POA: Insufficient documentation

## 2013-05-27 DIAGNOSIS — I2510 Atherosclerotic heart disease of native coronary artery without angina pectoris: Secondary | ICD-10-CM | POA: Insufficient documentation

## 2013-05-27 DIAGNOSIS — Z7982 Long term (current) use of aspirin: Secondary | ICD-10-CM | POA: Insufficient documentation

## 2013-05-27 DIAGNOSIS — M542 Cervicalgia: Secondary | ICD-10-CM | POA: Insufficient documentation

## 2013-05-27 DIAGNOSIS — R209 Unspecified disturbances of skin sensation: Secondary | ICD-10-CM | POA: Insufficient documentation

## 2013-05-27 DIAGNOSIS — Z79899 Other long term (current) drug therapy: Secondary | ICD-10-CM | POA: Insufficient documentation

## 2013-05-27 DIAGNOSIS — M129 Arthropathy, unspecified: Secondary | ICD-10-CM | POA: Insufficient documentation

## 2013-05-27 MED ORDER — OXYCODONE-ACETAMINOPHEN 5-325 MG PO TABS: 1.0000 | ORAL_TABLET | ORAL | Status: DC | PRN

## 2013-05-27 MED ORDER — OXYCODONE-ACETAMINOPHEN 5-325 MG PO TABS
1.0000 | ORAL_TABLET | Freq: Once | ORAL | Status: AC
  Administered 2013-05-27: 1 via ORAL
  Filled 2013-05-27: qty 1

## 2013-05-27 MED ORDER — METHOCARBAMOL 500 MG PO TABS: 500.0000 mg | ORAL_TABLET | Freq: Two times a day (BID) | ORAL | Status: DC | PRN

## 2013-05-27 MED ORDER — METHOCARBAMOL 500 MG PO TABS
500.0000 mg | ORAL_TABLET | Freq: Once | ORAL | Status: AC
  Administered 2013-05-27: 500 mg via ORAL
  Filled 2013-05-27: qty 1

## 2013-05-27 NOTE — ED Provider Notes (Signed)
CSN: [id number]     Arrival date & time 05/27/13  1251 History     First MD Initiated Contact with Patient 05/27/13 1316     Chief Complaint  Patient presents with  . Neck Pain   (Consider location/radiation/quality/duration/timing/severity/associated sxs/prior Treatment) The history is provided by the patient and medical records.   Patient presents to the ED for neck pain. No recent injury, trauma, or falls.  Pt states pain started yesterday and was much worse today upon waking.  Has some paresthesias of left arm, not of new onset but worse today than normal.  Patient has history of cervical spine arthritis, states it flares up on him from time to time. Has been seen in the ED for this previously and given pain medicine with significant relief. Today he states "I have an appt with my doctor on Friday, i just need some pain medicine to get me through until then."  Pt can turn his neck in all directions but it is painful.  No fevers, sweats, or chills.  No headaches, tinnitus, or visual disturbance.  Med records reviewed-- pt given percocet and muscle relaxer at previous visits.  Prior imaging with spondylosis at C4, C5.    Past Medical History  Diagnosis Date  . Coronary artery disease   . GERD (gastroesophageal reflux disease)    Past Surgical History  Procedure Laterality Date  . Coronary stent placement    . Bilateral shoulder arthroscopy     No family history on file. History  Substance Use Topics  . Smoking status: Current Every Day Smoker  . Smokeless tobacco: Not on file  . Alcohol Use: No    Review of Systems  HENT: Positive for neck pain.   All other systems reviewed and are negative.    Allergies  Review of patient's allergies indicates no known allergies.  Home Medications   Current Outpatient Rx  Name  Route  Sig  Dispense  Refill  . ALPRAZolam (XANAX) 0.25 MG tablet   Oral   Take 0.25 mg by mouth at bedtime as needed. To help sleep         . aspirin  EC 325 MG tablet   Oral   Take 325 mg by mouth daily.         Marland Kitchen atorvastatin (LIPITOR) 10 MG tablet   Oral   Take 10 mg by mouth daily.         Marland Kitchen azithromycin (ZITHROMAX Z-PAK) 250 MG tablet      2 po day one, then 1 daily x 4 days   6 tablet   0   . chlorpheniramine-HYDROcodone (TUSSIONEX PENNKINETIC ER) 10-8 MG/5ML LQCR   Oral   Take 5 mLs by mouth every 12 (twelve) hours. Take 5 mLs by mouth every 12 (twelve) hours.   140 mL   0   . cyclobenzaprine (FLEXERIL) 10 MG tablet   Oral   Take 1 tablet (10 mg total) by mouth 2 (two) times daily as needed for muscle spasms.   20 tablet   0   . esomeprazole (NEXIUM) 40 MG capsule   Oral   Take 40 mg by mouth daily as needed. For GERD         . guaiFENesin (MUCINEX) 600 MG 12 hr tablet   Oral   Take 2 tablets (1,200 mg total) by mouth 2 (two) times daily.   30 tablet   0   . oxyCODONE-acetaminophen (PERCOCET) 7.5-500 MG per tablet   Oral  Take 1 tablet by mouth every 4 (four) hours as needed for pain.   30 tablet   0    BP 125/83  Pulse 79  Temp(Src) 97.6 F (36.4 C) (Oral)  SpO2 97%  Physical Exam  Nursing note and vitals reviewed. Constitutional: He is oriented to person, place, and time. He appears well-developed and well-nourished.  HENT:  Head: Normocephalic and atraumatic.  Mouth/Throat: Oropharynx is clear and moist.  Eyes: Conjunctivae and EOM are normal. Pupils are equal, round, and reactive to light.  Neck: Normal range of motion. Neck supple. No rigidity.  Normal chin to chest without pain; No meningeal signs  Cardiovascular: Normal rate, regular rhythm and normal heart sounds.   Pulmonary/Chest: Effort normal and breath sounds normal.  Musculoskeletal: Normal range of motion.       Cervical back: He exhibits tenderness, bony tenderness, pain and spasm. He exhibits normal range of motion, no swelling, no edema, no deformity, no laceration and normal pulse.       Back:  CS with midline and  paraspinal TTP; no step off, bruising, abrasion, laceration, or swelling; full ROM in all directions maintained with some pain; strong radial pulses and cap refill, sensation intact  Neurological: He is alert and oriented to person, place, and time.  Skin: Skin is warm and dry.  Psychiatric: He has a normal mood and affect.    ED Course   Procedures (including critical care time)  Labs Reviewed - No data to display No results found.  1. Neck pain     MDM   Atraumatic neck pain, acute on chronic-- i doubt cervical fx or subluxation, imaging deferred.  No nuchal rigidity, fevers, or headaches to suggest meningitis. Rx Percocet and Robaxin. Patient will followup with his PCP at his previously scheduled appointment for Friday, 05/31/2013. Discussed plan with patient, he agreed. Return precautions advised.  Garlon Hatchet, PA-C 05/27/13 [phone]

## 2013-05-27 NOTE — ED Provider Notes (Signed)
Medical screening examination/treatment/procedure(s) were performed by non-physician practitioner and as supervising physician I was immediately available for consultation/collaboration.  Geoffery Lyons, MD 05/27/13 1447

## 2013-05-27 NOTE — ED Notes (Signed)
Pt. Stated, I've had neck pain and they said its arthritis, Yesterday it started really hurting and I can hardly move.

## 2013-07-10 ENCOUNTER — Other Ambulatory Visit (HOSPITAL_COMMUNITY): Payer: Self-pay | Admitting: Cardiology

## 2013-07-10 DIAGNOSIS — R079 Chest pain, unspecified: Secondary | ICD-10-CM

## 2013-07-11 ENCOUNTER — Encounter (HOSPITAL_COMMUNITY): Payer: Self-pay | Admitting: Physical Medicine and Rehabilitation

## 2013-07-11 ENCOUNTER — Emergency Department (HOSPITAL_COMMUNITY)
Admission: EM | Admit: 2013-07-11 | Discharge: 2013-07-11 | Disposition: A | Discharge status: Home / Self Care | Payer: Medicaid Other | Attending: Emergency Medicine | Admitting: Emergency Medicine

## 2013-07-11 ENCOUNTER — Emergency Department (HOSPITAL_COMMUNITY): Payer: Medicaid Other

## 2013-07-11 DIAGNOSIS — R0789 Other chest pain: Secondary | ICD-10-CM | POA: Insufficient documentation

## 2013-07-11 DIAGNOSIS — R079 Chest pain, unspecified: Secondary | ICD-10-CM

## 2013-07-11 DIAGNOSIS — I2510 Atherosclerotic heart disease of native coronary artery without angina pectoris: Secondary | ICD-10-CM | POA: Insufficient documentation

## 2013-07-11 DIAGNOSIS — K219 Gastro-esophageal reflux disease without esophagitis: Secondary | ICD-10-CM | POA: Insufficient documentation

## 2013-07-11 DIAGNOSIS — M549 Dorsalgia, unspecified: Secondary | ICD-10-CM | POA: Insufficient documentation

## 2013-07-11 DIAGNOSIS — R0602 Shortness of breath: Secondary | ICD-10-CM

## 2013-07-11 DIAGNOSIS — F17200 Nicotine dependence, unspecified, uncomplicated: Secondary | ICD-10-CM | POA: Insufficient documentation

## 2013-07-11 DIAGNOSIS — Z9861 Coronary angioplasty status: Secondary | ICD-10-CM | POA: Insufficient documentation

## 2013-07-11 DIAGNOSIS — Z7982 Long term (current) use of aspirin: Secondary | ICD-10-CM | POA: Insufficient documentation

## 2013-07-11 DIAGNOSIS — G8929 Other chronic pain: Secondary | ICD-10-CM

## 2013-07-11 DIAGNOSIS — Z79899 Other long term (current) drug therapy: Secondary | ICD-10-CM | POA: Insufficient documentation

## 2013-07-11 LAB — CBC
HCT: 43.9 % (ref 39.0–52.0)
Hemoglobin: 15 g/dL (ref 13.0–17.0)
MCHC: 34.2 g/dL (ref 30.0–36.0)
MCV: 96.3 fL (ref 78.0–100.0)
RDW: 13.4 % (ref 11.5–15.5)

## 2013-07-11 LAB — POCT I-STAT, CHEM 8
BUN: 15 mg/dL (ref 6–23)
Calcium, Ion: 1.15 mmol/L (ref 1.13–1.30)
Chloride: 107 mEq/L (ref 96–112)
Glucose, Bld: 98 mg/dL (ref 70–99)
TCO2: 24 mmol/L (ref 0–100)

## 2013-07-11 LAB — POCT I-STAT TROPONIN I

## 2013-07-11 MED ORDER — MORPHINE SULFATE 4 MG/ML IJ SOLN: 4.0000 mg | Freq: Once | INTRAMUSCULAR | Status: DC

## 2013-07-11 MED ORDER — MORPHINE SULFATE 4 MG/ML IJ SOLN
4.0000 mg | Freq: Once | INTRAMUSCULAR | Status: AC
  Administered 2013-07-11: 4 mg via INTRAVENOUS
  Filled 2013-07-11: qty 1

## 2013-07-11 MED ORDER — GI COCKTAIL ~~LOC~~: 30.0000 mL | Freq: Once | ORAL | Status: DC

## 2013-07-11 MED ORDER — OXYCODONE-ACETAMINOPHEN 5-325 MG PO TABS
1.0000 | ORAL_TABLET | Freq: Once | ORAL | Status: AC
  Administered 2013-07-11: 1 via ORAL
  Filled 2013-07-11: qty 1

## 2013-07-11 MED ORDER — OXYCODONE-ACETAMINOPHEN 5-325 MG PO TABS: 1.0000 | ORAL_TABLET | ORAL | Status: DC | PRN

## 2013-07-11 MED ORDER — ONDANSETRON HCL 4 MG/2ML IJ SOLN
4.0000 mg | Freq: Once | INTRAMUSCULAR | Status: AC
  Administered 2013-07-11: 4 mg via INTRAVENOUS
  Filled 2013-07-11: qty 2

## 2013-07-11 NOTE — ED Provider Notes (Signed)
CSN: [id number]     Arrival date & time 07/11/13  4098 History   First MD Initiated Contact with Patient 07/11/13 0622     Chief Complaint  Patient presents with  . Chest Pain  . Shortness of Breath   (Consider location/radiation/quality/duration/timing/severity/associated sxs/prior Treatment) HPI  62 year old male with history of CAD status post coronary stent placement, GERD presents complaining of chest pain shortness of breath. History obtained through patient and medical record. Patient reports for the past 2 weeks he has had intermittent chest discomfort. Describe pain as a sharp and burning sensation to mid chest radiates to left and right arm. Episodes usually lasting for a few minutes follows with nausea, shortness of breath," feeling sick". Symptoms appears both with exertion with rest. Nothing seems to make it better or worse. Last night he could not lay down to sleep symptom was persistent. Sxs has been lasting throughout the night and present currently.  Pain as high as 8/10, now 6/10. Has hx of GERD in which he takes Nexium for,  but sts this is not GERD.  States he usually sleeps with one pillow. No reports of fever, chills, productive cough, hemoptysis, headache, abdominal pain, vomiting, diarrhea, dysuria, or rash. He was seen by his cardiologist yesterday for the same complaint and was scheduled for a cardiac stress test next Tuesday. Patient is here because "I don't think i can make it until then".  Patient did take 2 aspirin this morning, 1 sublingual nitroglycerin, and a nitroglycerin patch but states there has been no improvement. Patient denies DOE.  Patient is a smoker but currently trying to quit.  Past Medical History  Diagnosis Date  . Coronary artery disease   . GERD (gastroesophageal reflux disease)    Past Surgical History  Procedure Laterality Date  . Coronary stent placement    . Bilateral shoulder arthroscopy     History reviewed. No pertinent family  history. History  Substance Use Topics  . Smoking status: Current Every Day Smoker    Types: Cigarettes  . Smokeless tobacco: Not on file  . Alcohol Use: No    Review of Systems  Constitutional: Negative for fever.  Respiratory: Positive for shortness of breath.   Cardiovascular: Positive for chest pain.  Skin: Negative for rash.  All other systems reviewed and are negative.    Allergies  Review of patient's allergies indicates no known allergies.  Home Medications   Current Outpatient Rx  Name  Route  Sig  Dispense  Refill  . ALPRAZolam (XANAX) 0.25 MG tablet   Oral   Take 0.25 mg by mouth at bedtime as needed. To help sleep         . aspirin EC 325 MG tablet   Oral   Take 325 mg by mouth daily.         Marland Kitchen esomeprazole (NEXIUM) 40 MG capsule   Oral   Take 40 mg by mouth daily as needed. For GERD         . lisinopril (PRINIVIL,ZESTRIL) 10 MG tablet   Oral   Take 10 mg by mouth daily.         . methocarbamol (ROBAXIN) 500 MG tablet   Oral   Take 1 tablet (500 mg total) by mouth 2 (two) times daily as needed.   14 tablet   0   . oxyCODONE-acetaminophen (PERCOCET/ROXICET) 5-325 MG per tablet   Oral   Take 1 tablet by mouth every 4 (four) hours as needed for pain.  15 tablet   0    BP 155/90  Pulse 82  Temp(Src) 98.6 F (37 C) (Oral)  Resp 18  Wt 198 lb (89.812 kg)  SpO2 97% Physical Exam  Nursing note and vitals reviewed. Constitutional: He appears well-developed and well-nourished. No distress.  Awake, alert, nontoxic appearance  HENT:  Head: Atraumatic.  Eyes: Conjunctivae are normal. Right eye exhibits no discharge. Left eye exhibits no discharge.  Neck: Normal range of motion. Neck supple. No JVD present.  Cardiovascular: Normal rate, regular rhythm and intact distal pulses.  Exam reveals no gallop and no friction rub.   No murmur heard. Pulmonary/Chest: Effort normal. No respiratory distress. He exhibits no tenderness.  Abdominal:  Soft. There is no tenderness. There is no rebound.  Musculoskeletal: He exhibits tenderness (mild paralumbar tenderness without significant midline tenderness, crepitus, stepoff). He exhibits no edema.  Neurological: He is alert.  Skin: Skin is warm and dry. No rash noted.  Psychiatric: He has a normal mood and affect.    ED Course  Procedures (including critical care time)   Date: 07/11/2013  Rate: 77  Rhythm: normal sinus rhythm  QRS Axis: normal  Intervals: normal  ST/T Wave abnormalities: normal  Conduction Disutrbances:none  Narrative Interpretation:   Old EKG Reviewed: unchanged  6:37 AM Patient with history of cardiac disease, here with worsening chest pain. Initial EKG shows no acute ischemic changes. He is afebrile with stable normal vital sign. No immediate risk factors concerning for PE. Plan to consult with cardiology once labs are resulted. Morphine and Zofran given. Last cardiac stress test in Feb of this year, normal finding without inducible ischemia.  8:10 AM I have consulted with Dr. Sharyn Lull, who recommend delta trop.  If neg, pt can f/u for stress test on Tuesday.Care discussed with attending.  10:58 AM Pt report pain is better.  Negative delta troponin.  Pt does request to have pain medication refilled for his chronic back pain.  Sts he's schedule to be seen by pain management clinic last week, unable to attend due to lack of transportation, and now pain is much worse without pain meds.  Will give short course of pain pills only.  Recommend pt to have his pain manage by pain specialist.  Pt recommend return if sxs worse or if he has any other concerns.  Labs Review Labs Reviewed  PRO B NATRIURETIC PEPTIDE  CBC  POCT I-STAT, CHEM 8  POCT I-STAT TROPONIN I  POCT I-STAT TROPONIN I   Imaging Review Dg Chest 2 View  07/11/2013   CLINICAL DATA:  Chest pain, shortness of breath  EXAM: CHEST  2 VIEW  COMPARISON:  January 09, 2013  FINDINGS: There is no focal  infiltrate, pulmonary edema, or pleural effusion. The mediastinal contour and cardiac silhouette are stable. The soft tissues and osseous structures are stable. There is probable chronic deformity of the left 10th rib and right 2nd rib.  IMPRESSION: No active cardiopulmonary disease.   Electronically Signed   By: Sherian Rein   On: 07/11/2013 07:17    MDM   1. Chest pain at rest   2. Shortness of breath   3. Chronic back pain    BP 130/79  Pulse 70  Temp(Src) 98.6 F (37 C) (Oral)  Resp 20  Wt 198 lb (89.812 kg)  SpO2 96%  I have reviewed nursing notes and vital signs. I personally reviewed the imaging tests through PACS system  I reviewed available ER/hospitalization records thought the EMR  Fayrene Helper, PA-C 07/11/13 1100

## 2013-07-11 NOTE — ED Notes (Signed)
Patient is resting comfortably. 

## 2013-07-11 NOTE — ED Notes (Signed)
PA advised of Blood Pressure being 180/85 and he said he advised he was okay to go home.  The patient has chronic back pain issues and has had multiple stress tests.

## 2013-07-11 NOTE — ED Notes (Signed)
Pt presents to department for evaluation of L sided chest pain and SOB. Ongoing x2 weeks. 8/10 pain at the time, increases with deep breathing and movement. Was seen by cardiologist yesterday, scheduled for stress test Tuesday. States "I don't think I can make it until then." respirations unlabored. Skin warm and dry. Pt is alert and oriented x4.

## 2013-07-12 NOTE — ED Provider Notes (Signed)
Medical screening examination/treatment/procedure(s) were performed by non-physician practitioner and as supervising physician I was immediately available for consultation/collaboration.    Shawnelle Spoerl R Lea Walbert, MD 07/12/13 0339 

## 2013-07-16 ENCOUNTER — Other Ambulatory Visit: Payer: Self-pay

## 2013-07-16 ENCOUNTER — Encounter (HOSPITAL_COMMUNITY)
Admission: RE | Admit: 2013-07-16 | Discharge: 2013-07-16 | Disposition: A | Discharge status: Home / Self Care | Payer: Medicaid Other | Source: Ambulatory Visit | Attending: Cardiology | Admitting: Cardiology

## 2013-07-16 DIAGNOSIS — R079 Chest pain, unspecified: Secondary | ICD-10-CM | POA: Insufficient documentation

## 2013-07-16 MED ORDER — REGADENOSON 0.4 MG/5ML IV SOLN
0.4000 mg | Freq: Once | INTRAVENOUS | Status: AC
  Administered 2013-07-16: 0.4 mg via INTRAVENOUS

## 2013-07-16 MED ORDER — REGADENOSON 0.4 MG/5ML IV SOLN
INTRAVENOUS | Status: AC
  Filled 2013-07-16: qty 5

## 2013-07-16 MED ORDER — TECHNETIUM TC 99M SESTAMIBI GENERIC - CARDIOLITE
10.0000 | Freq: Once | INTRAVENOUS | Status: AC | PRN
  Administered 2013-07-16: 10 via INTRAVENOUS

## 2013-07-16 MED ORDER — TECHNETIUM TC 99M SESTAMIBI GENERIC - CARDIOLITE
30.0000 | Freq: Once | INTRAVENOUS | Status: AC | PRN
  Administered 2013-07-16: 30 via INTRAVENOUS

## 2013-07-17 DIAGNOSIS — I219 Acute myocardial infarction, unspecified: Secondary | ICD-10-CM

## 2013-07-17 HISTORY — DX: Acute myocardial infarction, unspecified: I21.9

## 2013-07-29 ENCOUNTER — Encounter (HOSPITAL_COMMUNITY)
Admission: EM | Disposition: A | Discharge status: Home / Self Care | Payer: Self-pay | Source: Home / Self Care | Attending: Cardiology

## 2013-07-29 ENCOUNTER — Emergency Department (HOSPITAL_COMMUNITY): Payer: Medicaid Other

## 2013-07-29 ENCOUNTER — Inpatient Hospital Stay (HOSPITAL_COMMUNITY)
Admission: EM | Admit: 2013-07-29 | Discharge: 2013-08-02 | DRG: 247 | Disposition: A | Discharge status: Home / Self Care | Payer: Medicaid Other | Attending: Cardiology | Admitting: Cardiology

## 2013-07-29 ENCOUNTER — Encounter (HOSPITAL_COMMUNITY): Payer: Self-pay | Admitting: Emergency Medicine

## 2013-07-29 DIAGNOSIS — M545 Low back pain, unspecified: Secondary | ICD-10-CM | POA: Diagnosis present

## 2013-07-29 DIAGNOSIS — I2582 Chronic total occlusion of coronary artery: Secondary | ICD-10-CM | POA: Diagnosis present

## 2013-07-29 DIAGNOSIS — R7309 Other abnormal glucose: Secondary | ICD-10-CM | POA: Diagnosis present

## 2013-07-29 DIAGNOSIS — Z955 Presence of coronary angioplasty implant and graft: Secondary | ICD-10-CM

## 2013-07-29 DIAGNOSIS — G8929 Other chronic pain: Secondary | ICD-10-CM | POA: Diagnosis present

## 2013-07-29 DIAGNOSIS — I213 ST elevation (STEMI) myocardial infarction of unspecified site: Secondary | ICD-10-CM

## 2013-07-29 DIAGNOSIS — Z8249 Family history of ischemic heart disease and other diseases of the circulatory system: Secondary | ICD-10-CM

## 2013-07-29 DIAGNOSIS — I252 Old myocardial infarction: Secondary | ICD-10-CM

## 2013-07-29 DIAGNOSIS — Z79899 Other long term (current) drug therapy: Secondary | ICD-10-CM

## 2013-07-29 DIAGNOSIS — F17200 Nicotine dependence, unspecified, uncomplicated: Secondary | ICD-10-CM | POA: Diagnosis present

## 2013-07-29 DIAGNOSIS — F411 Generalized anxiety disorder: Secondary | ICD-10-CM | POA: Diagnosis present

## 2013-07-29 DIAGNOSIS — Z9861 Coronary angioplasty status: Secondary | ICD-10-CM

## 2013-07-29 DIAGNOSIS — I2109 ST elevation (STEMI) myocardial infarction involving other coronary artery of anterior wall: Principal | ICD-10-CM | POA: Diagnosis present

## 2013-07-29 DIAGNOSIS — I959 Hypotension, unspecified: Secondary | ICD-10-CM | POA: Diagnosis not present

## 2013-07-29 DIAGNOSIS — Z7982 Long term (current) use of aspirin: Secondary | ICD-10-CM

## 2013-07-29 DIAGNOSIS — I10 Essential (primary) hypertension: Secondary | ICD-10-CM | POA: Diagnosis present

## 2013-07-29 DIAGNOSIS — M1990 Unspecified osteoarthritis, unspecified site: Secondary | ICD-10-CM | POA: Diagnosis present

## 2013-07-29 DIAGNOSIS — Z7902 Long term (current) use of antithrombotics/antiplatelets: Secondary | ICD-10-CM

## 2013-07-29 DIAGNOSIS — R0789 Other chest pain: Secondary | ICD-10-CM

## 2013-07-29 DIAGNOSIS — I2510 Atherosclerotic heart disease of native coronary artery without angina pectoris: Secondary | ICD-10-CM | POA: Diagnosis present

## 2013-07-29 DIAGNOSIS — E7800 Pure hypercholesterolemia, unspecified: Secondary | ICD-10-CM | POA: Diagnosis present

## 2013-07-29 DIAGNOSIS — K219 Gastro-esophageal reflux disease without esophagitis: Secondary | ICD-10-CM | POA: Diagnosis present

## 2013-07-29 HISTORY — PX: LEFT HEART CATHETERIZATION WITH CORONARY ANGIOGRAM: SHX5451

## 2013-07-29 HISTORY — PX: PERCUTANEOUS STENT INTERVENTION: SHX5500

## 2013-07-29 LAB — CBC WITH DIFFERENTIAL/PLATELET
Basophils Absolute: 0 10*3/uL (ref 0.0–0.1)
Basophils Relative: 0 % (ref 0–1)
Basophils Relative: 0 % (ref 0–1)
Eosinophils Absolute: 0.2 10*3/uL (ref 0.0–0.7)
Eosinophils Absolute: 0.1 10*3/uL (ref 0.0–0.7)
Eosinophils Relative: 2 % (ref 0–5)
HCT: 41.1 % (ref 39.0–52.0)
HCT: 42 % (ref 39.0–52.0)
Hemoglobin: 15 g/dL (ref 13.0–17.0)
Hemoglobin: 15.2 g/dL (ref 13.0–17.0)
Lymphocytes Relative: 23 % (ref 12–46)
Lymphocytes Relative: 12 % (ref 12–46)
Lymphs Abs: 2.5 10*3/uL (ref 0.7–4.0)
MCH: 35.1 pg — ABNORMAL HIGH (ref 26.0–34.0)
MCH: 34.9 pg — ABNORMAL HIGH (ref 26.0–34.0)
MCHC: 36.5 g/dL — ABNORMAL HIGH (ref 30.0–36.0)
MCHC: 36.2 g/dL — ABNORMAL HIGH (ref 30.0–36.0)
MCV: 96.3 fL (ref 78.0–100.0)
MCV: 96.3 fL (ref 78.0–100.0)
Monocytes Absolute: 0.8 10*3/uL (ref 0.1–1.0)
Monocytes Absolute: 0.6 10*3/uL (ref 0.1–1.0)
Monocytes Relative: 7 % (ref 3–12)
Monocytes Relative: 6 % (ref 3–12)
Neutro Abs: 8.5 10*3/uL — ABNORMAL HIGH (ref 1.7–7.7)
Neutrophils Relative %: 82 % — ABNORMAL HIGH (ref 43–77)
Platelets: 191 10*3/uL (ref 150–400)
Platelets: 197 10*3/uL (ref 150–400)
RBC: 4.36 MIL/uL (ref 4.22–5.81)
RDW: 13.5 % (ref 11.5–15.5)
WBC: 10.7 10*3/uL — ABNORMAL HIGH (ref 4.0–10.5)

## 2013-07-29 LAB — COMPREHENSIVE METABOLIC PANEL
ALT: 95 U/L — ABNORMAL HIGH (ref 0–53)
AST: 366 U/L — ABNORMAL HIGH (ref 0–37)
Albumin: 3.7 g/dL (ref 3.5–5.2)
Alkaline Phosphatase: 68 U/L (ref 39–117)
Chloride: 103 mEq/L (ref 96–112)
Potassium: 3.5 mEq/L (ref 3.5–5.1)
Sodium: 138 mEq/L (ref 135–145)
Total Bilirubin: 0.4 mg/dL (ref 0.3–1.2)
Total Protein: 7 g/dL (ref 6.0–8.3)

## 2013-07-29 LAB — APTT: aPTT: 52 seconds — ABNORMAL HIGH (ref 24–37)

## 2013-07-29 LAB — BASIC METABOLIC PANEL
CO2: 20 mEq/L (ref 19–32)
Chloride: 103 mEq/L (ref 96–112)
Glucose, Bld: 113 mg/dL — ABNORMAL HIGH (ref 70–99)
Sodium: 137 mEq/L (ref 135–145)

## 2013-07-29 LAB — POCT I-STAT, CHEM 8
BUN: 18 mg/dL (ref 6–23)
Calcium, Ion: 1.1 mmol/L — ABNORMAL LOW (ref 1.13–1.30)
Chloride: 109 mEq/L (ref 96–112)
HCT: 45 % (ref 39.0–52.0)
Hemoglobin: 15.3 g/dL (ref 13.0–17.0)
Potassium: 3.6 mEq/L (ref 3.5–5.1)
Sodium: 143 mEq/L (ref 135–145)
TCO2: 19 mmol/L (ref 0–100)

## 2013-07-29 LAB — TROPONIN I: Troponin I: >20 ng/mL (ref <0.30)

## 2013-07-29 LAB — PROTIME-INR
INR: 0.99 (ref 0.00–1.49)
INR: 1.36 (ref 0.00–1.49)
Prothrombin Time: 16.4 seconds — ABNORMAL HIGH (ref 11.6–15.2)

## 2013-07-29 LAB — PRO B NATRIURETIC PEPTIDE: Pro B Natriuretic peptide (BNP): 61.6 pg/mL (ref 0–125)

## 2013-07-29 LAB — POCT I-STAT TROPONIN I: Troponin i, poc: 0 ng/mL (ref 0.00–0.08)

## 2013-07-29 LAB — MAGNESIUM: Magnesium: 1.5 mg/dL (ref 1.5–2.5)

## 2013-07-29 SURGERY — Surgical Case
Anesthesia: *Unknown

## 2013-07-29 SURGERY — LEFT HEART CATHETERIZATION WITH CORONARY ANGIOGRAM
Anesthesia: LOCAL

## 2013-07-29 SURGERY — LEFT HEART CATH
Anesthesia: LOCAL

## 2013-07-29 MED ORDER — HEPARIN SODIUM (PORCINE) 5000 UNIT/ML IJ SOLN: 60.0000 [IU]/kg | Freq: Once | INTRAMUSCULAR | Status: DC

## 2013-07-29 MED ORDER — ONDANSETRON HCL 4 MG/2ML IJ SOLN
INTRAMUSCULAR | Status: AC
  Administered 2013-07-29: 4 mg
  Filled 2013-07-29: qty 2

## 2013-07-29 MED ORDER — HEPARIN (PORCINE) IN NACL 100-0.45 UNIT/ML-% IJ SOLN
1100.0000 [IU]/h | INTRAMUSCULAR | Status: DC
  Administered 2013-07-29: 1100 [IU]/h via INTRAVENOUS
  Filled 2013-07-29: qty 250

## 2013-07-29 MED ORDER — TICAGRELOR 90 MG PO TABS
90.0000 mg | ORAL_TABLET | Freq: Two times a day (BID) | ORAL | Status: DC
  Administered 2013-07-29 – 2013-08-02 (×8): 90 mg via ORAL
  Filled 2013-07-29 (×10): qty 1

## 2013-07-29 MED ORDER — ACETAMINOPHEN 325 MG PO TABS
650.0000 mg | ORAL_TABLET | ORAL | Status: DC | PRN
  Administered 2013-07-30 – 2013-08-02 (×6): 650 mg via ORAL
  Filled 2013-07-29 (×6): qty 2

## 2013-07-29 MED ORDER — SODIUM CHLORIDE 0.9 % IV SOLN
Freq: Once | INTRAVENOUS | Status: AC
  Administered 2013-07-29: 14:00:00 via INTRAVENOUS

## 2013-07-29 MED ORDER — MORPHINE SULFATE 2 MG/ML IJ SOLN
2.0000 mg | Freq: Once | INTRAMUSCULAR | Status: AC
  Administered 2013-07-29: 2 mg via INTRAVENOUS
  Filled 2013-07-29: qty 1

## 2013-07-29 MED ORDER — SODIUM CHLORIDE 0.9 % IV SOLN
INTRAVENOUS | Status: DC
  Administered 2013-07-29 – 2013-07-30 (×2): via INTRAVENOUS

## 2013-07-29 MED ORDER — NITROGLYCERIN 0.4 MG SL SUBL: 0.4000 mg | SUBLINGUAL_TABLET | SUBLINGUAL | Status: DC | PRN

## 2013-07-29 MED ORDER — ASPIRIN EC 81 MG PO TBEC: 81.0000 mg | DELAYED_RELEASE_TABLET | Freq: Every day | ORAL | Status: DC

## 2013-07-29 MED ORDER — ATROPINE SULFATE 0.1 MG/ML IJ SOLN
INTRAMUSCULAR | Status: AC
  Filled 2013-07-29: qty 10

## 2013-07-29 MED ORDER — ALPRAZOLAM 0.25 MG PO TABS: 0.2500 mg | ORAL_TABLET | Freq: Two times a day (BID) | ORAL | Status: DC | PRN

## 2013-07-29 MED ORDER — NITROGLYCERIN 0.4 MG SL SUBL
0.4000 mg | SUBLINGUAL_TABLET | SUBLINGUAL | Status: DC | PRN
  Administered 2013-07-29: 0.4 mg via SUBLINGUAL
  Filled 2013-07-29: qty 25

## 2013-07-29 MED ORDER — BIVALIRUDIN 250 MG IV SOLR
INTRAVENOUS | Status: AC
  Filled 2013-07-29: qty 250

## 2013-07-29 MED ORDER — ONDANSETRON HCL 4 MG/2ML IJ SOLN
4.0000 mg | Freq: Four times a day (QID) | INTRAMUSCULAR | Status: DC | PRN
  Administered 2013-07-29 – 2013-07-30 (×2): 4 mg via INTRAVENOUS
  Filled 2013-07-29 (×2): qty 2

## 2013-07-29 MED ORDER — ASPIRIN EC 81 MG PO TBEC
81.0000 mg | DELAYED_RELEASE_TABLET | Freq: Every day | ORAL | Status: DC
  Administered 2013-07-30 – 2013-08-02 (×4): 81 mg via ORAL
  Filled 2013-07-29 (×4): qty 1

## 2013-07-29 MED ORDER — DOPAMINE-DEXTROSE 3.2-5 MG/ML-% IV SOLN: 5.0000 ug/kg/min | INTRAVENOUS | Status: DC

## 2013-07-29 MED ORDER — MORPHINE SULFATE 4 MG/ML IJ SOLN: 4.0000 mg | Freq: Once | INTRAMUSCULAR | Status: DC

## 2013-07-29 MED ORDER — ATORVASTATIN CALCIUM 80 MG PO TABS
80.0000 mg | ORAL_TABLET | Freq: Every day | ORAL | Status: DC
  Filled 2013-07-29 (×3): qty 1

## 2013-07-29 MED ORDER — NITROGLYCERIN IN D5W 200-5 MCG/ML-% IV SOLN
10.0000 ug/min | Freq: Once | INTRAVENOUS | Status: AC
  Administered 2013-07-29: 10 ug/min via INTRAVENOUS
  Filled 2013-07-29: qty 250

## 2013-07-29 MED ORDER — ONDANSETRON HCL 4 MG/2ML IJ SOLN: 4.0000 mg | Freq: Four times a day (QID) | INTRAMUSCULAR | Status: DC | PRN

## 2013-07-29 MED ORDER — NITROGLYCERIN 0.2 MG/ML ON CALL CATH LAB
INTRAVENOUS | Status: AC
  Filled 2013-07-29: qty 1

## 2013-07-29 MED ORDER — NITROGLYCERIN IN D5W 200-5 MCG/ML-% IV SOLN: 5.0000 ug/min | INTRAVENOUS | Status: DC

## 2013-07-29 MED ORDER — FUROSEMIDE 10 MG/ML IJ SOLN
INTRAMUSCULAR | Status: AC
  Filled 2013-07-29: qty 4

## 2013-07-29 MED ORDER — ALPRAZOLAM 0.5 MG PO TABS
0.5000 mg | ORAL_TABLET | Freq: Two times a day (BID) | ORAL | Status: DC | PRN
  Administered 2013-07-29 – 2013-08-02 (×7): 0.5 mg via ORAL
  Filled 2013-07-29: qty 2
  Filled 2013-07-29 (×6): qty 1

## 2013-07-29 MED ORDER — HEPARIN (PORCINE) IN NACL 2-0.9 UNIT/ML-% IJ SOLN
INTRAMUSCULAR | Status: AC
  Filled 2013-07-29: qty 1000

## 2013-07-29 MED ORDER — METOPROLOL SUCCINATE ER 25 MG PO TB24
25.0000 mg | ORAL_TABLET | Freq: Every day | ORAL | Status: DC
  Administered 2013-07-29: 25 mg via ORAL
  Filled 2013-07-29 (×2): qty 1

## 2013-07-29 MED ORDER — ACETAMINOPHEN 325 MG PO TABS: 650.0000 mg | ORAL_TABLET | ORAL | Status: DC | PRN

## 2013-07-29 MED ORDER — HEPARIN (PORCINE) IN NACL 100-0.45 UNIT/ML-% IJ SOLN: 12.0000 [IU]/kg/h | INTRAMUSCULAR | Status: DC

## 2013-07-29 MED ORDER — SODIUM CHLORIDE 0.9 % IV SOLN
0.2500 mg/kg/h | INTRAVENOUS | Status: AC
  Filled 2013-07-29: qty 250

## 2013-07-29 MED ORDER — ASPIRIN 81 MG PO CHEW
324.0000 mg | CHEWABLE_TABLET | Freq: Once | ORAL | Status: AC
  Administered 2013-07-29: 324 mg via ORAL
  Filled 2013-07-29: qty 4

## 2013-07-29 MED ORDER — ASPIRIN 300 MG RE SUPP: 300.0000 mg | RECTAL | Status: DC

## 2013-07-29 MED ORDER — IOHEXOL 350 MG/ML SOLN
100.0000 mL | Freq: Once | INTRAVENOUS | Status: AC | PRN
  Administered 2013-07-29: 100 mL via INTRAVENOUS

## 2013-07-29 MED ORDER — OXYCODONE-ACETAMINOPHEN 5-325 MG PO TABS
1.0000 | ORAL_TABLET | Freq: Three times a day (TID) | ORAL | Status: DC | PRN
  Administered 2013-07-29: 1 via ORAL
  Filled 2013-07-29: qty 1

## 2013-07-29 MED ORDER — ASPIRIN 81 MG PO CHEW: 324.0000 mg | CHEWABLE_TABLET | ORAL | Status: DC

## 2013-07-29 MED ORDER — DOPAMINE-DEXTROSE 3.2-5 MG/ML-% IV SOLN
INTRAVENOUS | Status: AC
  Filled 2013-07-29: qty 250

## 2013-07-29 MED ORDER — HEPARIN BOLUS VIA INFUSION
4000.0000 [IU] | Freq: Once | INTRAVENOUS | Status: AC
  Administered 2013-07-29: 4000 [IU] via INTRAVENOUS
  Filled 2013-07-29: qty 4000

## 2013-07-29 MED ORDER — ONDANSETRON HCL 4 MG/2ML IJ SOLN
INTRAMUSCULAR | Status: AC
  Filled 2013-07-29: qty 2

## 2013-07-29 MED ORDER — ACETAMINOPHEN 325 MG PO TABS
650.0000 mg | ORAL_TABLET | Freq: Once | ORAL | Status: AC
  Administered 2013-07-29: 650 mg via ORAL
  Filled 2013-07-29: qty 2

## 2013-07-29 MED ORDER — LIDOCAINE HCL (PF) 1 % IJ SOLN
INTRAMUSCULAR | Status: AC
  Filled 2013-07-29: qty 30

## 2013-07-29 MED ORDER — FENTANYL CITRATE 0.05 MG/ML IJ SOLN
INTRAMUSCULAR | Status: AC
  Filled 2013-07-29: qty 2

## 2013-07-29 MED ORDER — TICAGRELOR 90 MG PO TABS
180.0000 mg | ORAL_TABLET | ORAL | Status: AC
  Administered 2013-07-29: 180 mg via ORAL
  Filled 2013-07-29: qty 2

## 2013-07-29 MED ORDER — MIDAZOLAM HCL 2 MG/2ML IJ SOLN
INTRAMUSCULAR | Status: AC
  Filled 2013-07-29: qty 2

## 2013-07-29 NOTE — Progress Notes (Signed)
ANTICOAGULATION CONSULT NOTE - Initial Consult  Pharmacy Consult for heparin Indication: chest pain/ACS  No Known Allergies  Patient Measurements: weight 92 kg, height 67 inches (per patient)   Heparin Dosing Weight: 85 kg  Vital Signs: Temp: 97.5 F (36.4 C) (10/13 1259) Temp src: Oral (10/13 1259) BP: 124/88 mmHg (10/13 1454) Pulse Rate: 69 (10/13 1454)  Labs:  Recent Labs  07/29/13 1308 07/29/13 1332  HGB 15.0 15.3  HCT 41.1 45.0  PLT 191  --   LABPROT 12.9  --   INR 0.99  --   CREATININE 0.85 1.10    CrCl is unknown because there is no height on file for the current visit.   Medical History: Past Medical History  Diagnosis Date  . Coronary artery disease   . GERD (gastroesophageal reflux disease)     Medications:  See med rec  Assessment: Patient's a 62 y.o M presented to the ED with CP and now with elevated troponin.  To start heparin for r/o ACS.  Goal of Therapy:  Heparin level 0.3-0.7 units/ml Monitor platelets by anticoagulation protocol: Yes   Plan:  1) heparin 4000 units Iv x1 bolus, then heparin drip at 1100 units/hr 2) check 6 hour heparin level  Peter Booker P 07/29/2013,5:03 PM

## 2013-07-29 NOTE — Progress Notes (Signed)
Chaplain responded to code STEMI for pt in Trauma C. Three family members were present, including pt's son, daughter, and daughter-in-law. Visited briefly with family but primarily with pt. Pt states he had a stent put in by Dr. Sharyn Lull 14 years ago and seems confident that all will go well. When pt went to Cath Lab, family and I followed and I led them to waiting area. I reviewed cath lab procedure for family. Pt's son has been a pt here himself; heart problems run in the family. I checked in with family again after about an hour. They hadn't heard from doctor yet so I went to check. Procedure had just been completed. A technician accompanied me back to speak with the family.He told them a stent had been successfully inserted. Pt will be going to room 2H11. I then took family to [facility] waiting area. Dr. Sharyn Lull was to come speak with them there.

## 2013-07-29 NOTE — Progress Notes (Signed)
CRITICAL VALUE ALERT  Critical value received:  Trop >20  Date of notification:  07/29/2013   Time of notification:  10:36 PM  Critical value read back:yes  Nurse who received alert:  Marinus Maw  Post PCI procedure value expected

## 2013-07-29 NOTE — ED Notes (Signed)
CT called and stated to wait until I-stat chem 8 results.

## 2013-07-29 NOTE — Interval H&P Note (Signed)
Cath Lab Visit (complete for each Cath Lab visit)  Clinical Evaluation Leading to the Procedure:   ACS: yes  Non-ACS:    Anginal Classification: CCS IV  Anti-ischemic medical therapy: Maximal Therapy (2 or more classes of medications)  Non-Invasive Test Results: No non-invasive testing performed  Prior CABG: No previous CABG      History and Physical Interval Note:  07/29/2013 7:54 PM  Peter Gulling Sr.  has presented today for surgery, with the diagnosis of stemi  The various methods of treatment have been discussed with the patient and family. After consideration of risks, benefits and other options for treatment, the patient has consented to  Procedure(s): LEFT HEART CATHETERIZATION WITH CORONARY ANGIOGRAM (N/A) PERCUTANEOUS STENT INTERVENTION as a surgical intervention .  The patient's history has been reviewed, patient examined, no change in status, stable for surgery.  I have reviewed the patient's chart and labs.  Questions were answered to the patient's satisfaction.     Robynn Pane

## 2013-07-29 NOTE — H&P (Signed)
Peter Gulling Sr. is an 63 y.o. male.   Chief Complaint: Recurrent chest pain HPI: Patient is 62 year old male with past medical history significant for coronary artery disease status post PTCA stenting to proximal LAD more than 10 years ago, hypertension, tobacco abuse, anxiety disorder, GERD, strong family history of coronary artery disease all his brothers had MI in their 81s, came to the ER complaining of recurrent retrosternal chest pain described as burning grade 10 over 10 radiating to the neck and back without associated symptoms initial EKG done in the ER showed normal sinus rhythm with minor ST-T wave changes in inferolateral leads patient repeat EKG done on the common sinus rhythm with Q waves in lead V1 to V4 with ST elevation which were new as compared to prior EKG"; scald her. Patient has been having recurrent vague left-sided chest pain had nuclear stress test on 07/10/2013 approximately 3 weeks ago which was negative for ischemia with EF of 54%.  Past Medical History  Diagnosis Date  . Coronary artery disease   . GERD (gastroesophageal reflux disease)     Past Surgical History  Procedure Laterality Date  . Coronary stent placement    . Bilateral shoulder arthroscopy      History reviewed. No pertinent family history. Social History:  reports that he has been smoking Cigarettes.  He has been smoking about 0.00 packs per day. He does not have any smokeless tobacco history on file. He reports that he does not drink alcohol or use illicit drugs.  Allergies: No Known Allergies  Medications Prior to Admission  Medication Sig Dispense Refill  . ALPRAZolam (XANAX) 0.5 MG tablet Take 0.5 mg by mouth 2 (two) times daily.       Marland Kitchen aspirin EC 81 MG tablet Take 81 mg by mouth daily.      . clopidogrel (PLAVIX) 75 MG tablet Take 75 mg by mouth daily.      Marland Kitchen esomeprazole (NEXIUM) 40 MG capsule Take 40 mg by mouth daily. For GERD      . lisinopril (PRINIVIL,ZESTRIL) 10 MG tablet Take  10 mg by mouth daily.      . metoprolol succinate (TOPROL-XL) 25 MG 24 hr tablet Take 25 mg by mouth daily.      . nitroGLYCERIN (NITROSTAT) 0.4 MG SL tablet Place 0.4 mg under the tongue every 5 (five) minutes as needed for chest pain.        Results for orders placed during the hospital encounter of 07/29/13 (from the past 48 hour(s))  BASIC METABOLIC PANEL     Status: Abnormal   Collection Time    07/29/13  1:08 PM      Result Value Range   Sodium 137  135 - 145 mEq/L   Potassium 3.6  3.5 - 5.1 mEq/L   Chloride 103  96 - 112 mEq/L   CO2 20  19 - 32 mEq/L   Glucose, Bld 113 (*) 70 - 99 mg/dL   BUN 18  6 - 23 mg/dL   Creatinine, Ser 1.61  0.50 - 1.35 mg/dL   Calcium 8.6  8.4 - 09.6 mg/dL   GFR calc non Af Amer >90  >90 mL/min   GFR calc Af Amer >90  >90 mL/min   Comment: (NOTE)     The eGFR has been calculated using the CKD EPI equation.     This calculation has not been validated in all clinical situations.     eGFR's persistently <90 mL/min signify possible Chronic Kidney  Disease.  PRO B NATRIURETIC PEPTIDE     Status: None   Collection Time    07/29/13  1:08 PM      Result Value Range   Pro B Natriuretic peptide (BNP) 61.6  0 - 125 pg/mL  PROTIME-INR     Status: None   Collection Time    07/29/13  1:08 PM      Result Value Range   Prothrombin Time 12.9  11.6 - 15.2 seconds   INR 0.99  0.00 - 1.49  CBC WITH DIFFERENTIAL     Status: Abnormal   Collection Time    07/29/13  1:08 PM      Result Value Range   WBC 10.7 (*) 4.0 - 10.5 K/uL   RBC 4.27  4.22 - 5.81 MIL/uL   Hemoglobin 15.0  13.0 - 17.0 g/dL   HCT 16.1  09.6 - 04.5 %   MCV 96.3  78.0 - 100.0 fL   MCH 35.1 (*) 26.0 - 34.0 pg   MCHC 36.5 (*) 30.0 - 36.0 g/dL   RDW 40.9  81.1 - 91.4 %   Platelets 191  150 - 400 K/uL   Neutrophils Relative % 68  43 - 77 %   Neutro Abs 7.2  1.7 - 7.7 K/uL   Lymphocytes Relative 23  12 - 46 %   Lymphs Abs 2.5  0.7 - 4.0 K/uL   Monocytes Relative 7  3 - 12 %   Monocytes  Absolute 0.8  0.1 - 1.0 K/uL   Eosinophils Relative 2  0 - 5 %   Eosinophils Absolute 0.2  0.0 - 0.7 K/uL   Basophils Relative 0  0 - 1 %   Basophils Absolute 0.0  0.0 - 0.1 K/uL  POCT I-STAT TROPONIN I     Status: None   Collection Time    07/29/13  1:30 PM      Result Value Range   Troponin i, poc 0.00  0.00 - 0.08 ng/mL   Comment 3            Comment: Due to the release kinetics of cTnI,     a negative result within the first hours     of the onset of symptoms does not rule out     myocardial infarction with certainty.     If myocardial infarction is still suspected,     repeat the test at appropriate intervals.  POCT I-STAT, CHEM 8     Status: Abnormal   Collection Time    07/29/13  1:32 PM      Result Value Range   Sodium 143  135 - 145 mEq/L   Potassium 3.6  3.5 - 5.1 mEq/L   Chloride 109  96 - 112 mEq/L   BUN 18  6 - 23 mg/dL   Creatinine, Ser 7.82  0.50 - 1.35 mg/dL   Glucose, Bld 956 (*) 70 - 99 mg/dL   Calcium, Ion 2.13 (*) 1.13 - 1.30 mmol/L   TCO2 19  0 - 100 mmol/L   Hemoglobin 15.3  13.0 - 17.0 g/dL   HCT 08.6  57.8 - 46.9 %  POCT I-STAT TROPONIN I     Status: Abnormal   Collection Time    07/29/13  4:01 PM      Result Value Range   Troponin i, poc 0.11 (*) 0.00 - 0.08 ng/mL   Comment NOTIFIED PHYSICIAN     Comment 3  Comment: Due to the release kinetics of cTnI,     a negative result within the first hours     of the onset of symptoms does not rule out     myocardial infarction with certainty.     If myocardial infarction is still suspected,     repeat the test at appropriate intervals.  OCCULT BLOOD, POC DEVICE     Status: None   Collection Time    07/29/13  5:08 PM      Result Value Range   Fecal Occult Bld NEGATIVE  NEGATIVE   Ct Angio Chest W/cm &/or Wo Cm  07/29/2013   CLINICAL DATA:  Chest pain, tachypneic, diaphoresis  EXAM: CT ANGIOGRAPHY CHEST WITH CONTRAST  TECHNIQUE: Multidetector CT imaging of the chest was performed using the  standard protocol during bolus administration of intravenous contrast. Multiplanar CT image reconstructions including MIPs were obtained to evaluate the vascular anatomy.  CONTRAST:  OMNIPAQUE IOHEXOL 350 MG/ML SOLN  COMPARISON:  Chest radiograph dated 07/29/2013  FINDINGS: No evidence of pulmonary embolism.  Mild dependent atelectasis in the bilateral lower lobes. No pleural effusion or pneumothorax.  Visualized thyroid is unremarkable.  The heart is normal in size. No pericardial effusion. Coronary atherosclerosis. Mild atherosclerotic calcifications of the aortic arch.  Small mediastinal/right perihilar nodes which do not meet pathologic CT size criteria.  Visualized upper abdomen is notable for a mildly nodular hepatic contour.  Degenerative changes of the visualized thoracolumbar spine.  Review of the MIP images confirms the above findings.  IMPRESSION: No evidence of pulmonary embolism.  No evidence of acute cardiopulmonary disease.   Electronically Signed   By: Charline Bills M.D.   On: 07/29/2013 14:12   Dg Chest Port 1 View  07/29/2013   CLINICAL DATA:  Chest pain.  EXAM: PORTABLE CHEST - 1 VIEW  COMPARISON:  07/11/2013  FINDINGS: The heart size and mediastinal contours are within normal limits. Both lungs are clear. The visualized skeletal structures are unremarkable.  IMPRESSION: No active disease.   Electronically Signed   By: Geanie Cooley M.D.   On: 07/29/2013 13:36    Review of Systems  Constitutional: Negative for fever, chills and weight loss.  Eyes: Negative for blurred vision.  Respiratory: Negative for cough, hemoptysis and sputum production.   Cardiovascular: Positive for chest pain. Negative for palpitations, orthopnea and claudication.  Gastrointestinal: Negative for nausea, vomiting, abdominal pain and diarrhea.  Genitourinary: Negative for dysuria.  Neurological: Negative for dizziness and headaches.    Blood pressure 116/69, pulse 76, temperature 97.5 F (36.4 C),  temperature source Oral, resp. rate 15, SpO2 94.00%. Physical Exam  Constitutional: He is oriented to person, place, and time. He appears well-developed and well-nourished.  HENT:  Head: Normocephalic.  Eyes: Conjunctivae and EOM are normal. Pupils are equal, round, and reactive to light. No scleral icterus.  Neck: Normal range of motion. Neck supple. No JVD present. No tracheal deviation present. No thyromegaly present.  Cardiovascular: Normal rate and regular rhythm.  Exam reveals no friction rub.   Murmur (Soft systolic murmur and S4 gallop noted) heard. Respiratory: Effort normal and breath sounds normal. No respiratory distress. He has no wheezes.  GI: Soft. Bowel sounds are normal. He exhibits no distension. There is no tenderness. There is no rebound.  Musculoskeletal: He exhibits no edema and no tenderness.  Neurological: He is alert and oriented to person, place, and time.     Assessment/Plan Acute anterolateral wall myocardial infarction Coronary artery disease status  post PCI to proximal LAD in remote past Hypertension Tobacco abuse Strong family history of coronary artery disease GERD Anxiety disorder Plan Discussed with patient regarding emergency left cath possible PTCA stenting its risk and benefits i.e. death MI stroke need for emergency CABG local vascular complications etc. and consented for PCI [facility] 07/29/2013, 7:46 PM

## 2013-07-29 NOTE — ED Notes (Signed)
Pharmacy called RE heparin.

## 2013-07-29 NOTE — ED Provider Notes (Signed)
CSN: [id number]     Arrival date & time 07/29/13  1249 History   First MD Initiated Contact with Patient 07/29/13 1311     Chief Complaint  Patient presents with  . Chest Pain   (Consider location/radiation/quality/duration/timing/severity/associated sxs/prior Treatment) HPI Comments: 62 yo male presents with cc of CP.  O: 1 hr ago P: improved with ntg Q: ache R: substernal and L neck and chest S: 10/10 T: steady pain  Pt was seen on 07/01/13 for similar complaint.  Discharged home.  Case was discussed with pt's pcm Dr. Levie Heritage.    Recent MPS Nuc Med study - 07/16/13 - LVEF 54%, No inducible ischemia  Pt is out of percocet.  Risk factors are h/o acs, increased lipids, HTN, anxiety, and chronic LBP.    Patient is a 62 y.o. male presenting with chest pain. The history is provided by the patient.  Chest Pain Pain location:  Substernal area Pain quality: aching   Pain radiates to:  L jaw Pain radiates to the back: yes   Pain severity:  Severe Onset quality:  Gradual Duration:  1 hour Timing:  Constant Progression:  Worsening Chronicity:  Recurrent Context: at rest   Context: not breathing, not eating, no intercourse, not lifting, not raising an arm and no trauma   Relieved by:  Aspirin and nitroglycerin Worsened by:  Nothing tried Associated symptoms: anxiety, back pain, fatigue, lower extremity edema and nausea   Associated symptoms: no abdominal pain, no AICD problem, no altered mental status, no anorexia, no diaphoresis, no dizziness, no fever, no headache, no heartburn, no numbness, no palpitations, not vomiting and no weakness   Associated symptoms comment:  Chronic Back pain   Past Medical History  Diagnosis Date  . Coronary artery disease   . GERD (gastroesophageal reflux disease)    Past Surgical History  Procedure Laterality Date  . Coronary stent placement    . Bilateral shoulder arthroscopy     History reviewed. No pertinent family history. History   Substance Use Topics  . Smoking status: Current Every Day Smoker    Types: Cigarettes  . Smokeless tobacco: Not on file  . Alcohol Use: No    Review of Systems  Constitutional: Positive for activity change and fatigue. Negative for fever, chills, diaphoresis, appetite change and unexpected weight change.  HENT: Negative.        Complains of jaw pain  Eyes: Negative.   Respiratory: Negative.   Cardiovascular: Positive for chest pain. Negative for palpitations and leg swelling.  Gastrointestinal: Positive for nausea. Negative for heartburn, vomiting, abdominal pain, constipation, blood in stool, rectal pain and anorexia.  Endocrine: Negative.   Genitourinary: Negative.   Musculoskeletal: Positive for back pain.  Neurological: Negative.  Negative for dizziness, tremors, seizures, syncope, facial asymmetry, speech difficulty, weakness, light-headedness, numbness and headaches.  Hematological: Negative.     Allergies  Review of patient's allergies indicates no known allergies.  Home Medications   Current Outpatient Rx  Name  Route  Sig  Dispense  Refill  . ALPRAZolam (XANAX) 0.5 MG tablet   Oral   Take 0.5 mg by mouth 2 (two) times daily.          Marland Kitchen aspirin EC 81 MG tablet   Oral   Take 81 mg by mouth daily.         . clopidogrel (PLAVIX) 75 MG tablet   Oral   Take 75 mg by mouth daily.         Marland Kitchen  esomeprazole (NEXIUM) 40 MG capsule   Oral   Take 40 mg by mouth daily. For GERD         . lisinopril (PRINIVIL,ZESTRIL) 10 MG tablet   Oral   Take 10 mg by mouth daily.         . metoprolol succinate (TOPROL-XL) 25 MG 24 hr tablet   Oral   Take 25 mg by mouth daily.         . nitroGLYCERIN (NITROSTAT) 0.4 MG SL tablet   Sublingual   Place 0.4 mg under the tongue every 5 (five) minutes as needed for chest pain.          BP 124/88  Pulse 69  Temp(Src) 97.5 F (36.4 C) (Oral)  Resp 16  SpO2 98% Physical Exam  Constitutional: He is oriented to person,  place, and time. He appears well-developed and well-nourished. He appears distressed.  HENT:  Head: Normocephalic and atraumatic.  Eyes: Conjunctivae are normal. Right eye exhibits no discharge. Left eye exhibits no discharge. No scleral icterus.  Neck: Normal range of motion. Neck supple. No tracheal deviation present. No thyromegaly present.  Cardiovascular: Normal rate, regular rhythm, intact distal pulses and normal pulses.  Exam reveals no gallop and no friction rub.   No murmur heard. Pulses symmetric in bilat UE  Pulmonary/Chest: Effort normal and breath sounds normal. No respiratory distress. He has no wheezes. He has no rales. He exhibits no tenderness.  Abdominal: Soft. Bowel sounds are normal. He exhibits no distension and no mass. There is no tenderness. There is no rebound and no guarding.  Musculoskeletal: Normal range of motion. He exhibits no edema and no tenderness.  Neurological: He is alert and oriented to person, place, and time. He has normal reflexes.  Skin: Skin is warm. He is not diaphoretic.    ED Course  Procedures (including critical care time) Labs Review Results for orders placed during the hospital encounter of 07/29/13  BASIC METABOLIC PANEL      Result Value Range   Sodium 137  135 - 145 mEq/L   Potassium 3.6  3.5 - 5.1 mEq/L   Chloride 103  96 - 112 mEq/L   CO2 20  19 - 32 mEq/L   Glucose, Bld 113 (*) 70 - 99 mg/dL   BUN 18  6 - 23 mg/dL   Creatinine, Ser 6.04  0.50 - 1.35 mg/dL   Calcium 8.6  8.4 - 54.0 mg/dL   GFR calc non Af Amer >90  >90 mL/min   GFR calc Af Amer >90  >90 mL/min  PRO B NATRIURETIC PEPTIDE      Result Value Range   Pro B Natriuretic peptide (BNP) 61.6  0 - 125 pg/mL  PROTIME-INR      Result Value Range   Prothrombin Time 12.9  11.6 - 15.2 seconds   INR 0.99  0.00 - 1.49  CBC WITH DIFFERENTIAL      Result Value Range   WBC 10.7 (*) 4.0 - 10.5 K/uL   RBC 4.27  4.22 - 5.81 MIL/uL   Hemoglobin 15.0  13.0 - 17.0 g/dL   HCT  98.1  19.1 - 47.8 %   MCV 96.3  78.0 - 100.0 fL   MCH 35.1 (*) 26.0 - 34.0 pg   MCHC 36.5 (*) 30.0 - 36.0 g/dL   RDW 29.5  62.1 - 30.8 %   Platelets 191  150 - 400 K/uL   Neutrophils Relative % 68  43 - 77 %  Neutro Abs 7.2  1.7 - 7.7 K/uL   Lymphocytes Relative 23  12 - 46 %   Lymphs Abs 2.5  0.7 - 4.0 K/uL   Monocytes Relative 7  3 - 12 %   Monocytes Absolute 0.8  0.1 - 1.0 K/uL   Eosinophils Relative 2  0 - 5 %   Eosinophils Absolute 0.2  0.0 - 0.7 K/uL   Basophils Relative 0  0 - 1 %   Basophils Absolute 0.0  0.0 - 0.1 K/uL  POCT I-STAT, CHEM 8      Result Value Range   Sodium 143  135 - 145 mEq/L   Potassium 3.6  3.5 - 5.1 mEq/L   Chloride 109  96 - 112 mEq/L   BUN 18  6 - 23 mg/dL   Creatinine, Ser 1.61  0.50 - 1.35 mg/dL   Glucose, Bld 096 (*) 70 - 99 mg/dL   Calcium, Ion 0.45 (*) 1.13 - 1.30 mmol/L   TCO2 19  0 - 100 mmol/L   Hemoglobin 15.3  13.0 - 17.0 g/dL   HCT 40.9  81.1 - 91.4 %  POCT I-STAT TROPONIN I      Result Value Range   Troponin i, poc 0.00  0.00 - 0.08 ng/mL   Comment 3             Second Troponin: 0.11  Imaging Review Ct Angio Chest W/cm &/or Wo Cm  07/29/2013   CLINICAL DATA:  Chest pain, tachypneic, diaphoresis  EXAM: CT ANGIOGRAPHY CHEST WITH CONTRAST  TECHNIQUE: Multidetector CT imaging of the chest was performed using the standard protocol during bolus administration of intravenous contrast. Multiplanar CT image reconstructions including MIPs were obtained to evaluate the vascular anatomy.  CONTRAST:  OMNIPAQUE IOHEXOL 350 MG/ML SOLN  COMPARISON:  Chest radiograph dated 07/29/2013  FINDINGS: No evidence of pulmonary embolism.  Mild dependent atelectasis in the bilateral lower lobes. No pleural effusion or pneumothorax.  Visualized thyroid is unremarkable.  The heart is normal in size. No pericardial effusion. Coronary atherosclerosis. Mild atherosclerotic calcifications of the aortic arch.  Small mediastinal/right perihilar nodes which do not  meet pathologic CT size criteria.  Visualized upper abdomen is notable for a mildly nodular hepatic contour.  Degenerative changes of the visualized thoracolumbar spine.  Review of the MIP images confirms the above findings.  IMPRESSION: No evidence of pulmonary embolism.  No evidence of acute cardiopulmonary disease.   Electronically Signed   By: Charline Bills M.D.   On: 07/29/2013 14:12   Dg Chest Port 1 View  07/29/2013   CLINICAL DATA:  Chest pain.  EXAM: PORTABLE CHEST - 1 VIEW  COMPARISON:  07/11/2013  FINDINGS: The heart size and mediastinal contours are within normal limits. Both lungs are clear. The visualized skeletal structures are unremarkable.  IMPRESSION: No active disease.   Electronically Signed   By: Geanie Cooley M.D.   On: 07/29/2013 13:36     Date: 07/29/2013  Rate: 76  Rhythm: normal sinus rhythm with PACs  QRS Axis: normal  Intervals: normal  ST/T Wave abnormalities: nonspecific ST changes  Conduction Disutrbances:none  Narrative Interpretation:   Old EKG Reviewed: changes noted   Date: 07/29/2013  Rate: 70  Rhythm: normal sinus rhythm  QRS Axis: normal  Intervals: normal  ST/T Wave abnormalities: nonspecific ST changes  Conduction Disutrbances:right bundle branch block  Narrative Interpretation: RBBB, STS changes lat leads  Old EKG Reviewed: changes noted   CRITICAL CARE Performed by: Redgie Grayer, DAVID  Total critical care time: 45  Critical care time was exclusive of separately billable procedures and treating other patients.  Critical care was necessary to treat or prevent imminent or life-threatening deterioration.  Critical care was time spent personally by me on the following activities: development of treatment plan with patient and/or surrogate as well as nursing, discussions with consultants, evaluation of patient's response to treatment, examination of patient, obtaining history from patient or surrogate, ordering and performing treatments and  interventions, ordering and review of laboratory studies, ordering and review of radiographic studies, pulse oximetry and re-evaluation of patient's condition.  MDM   1. Chest pain, atypical    62 year old white male presents emergency department with chief complaint of chest pain. Patient was in distress upon arrival secondary to the pain. He is followed by Dr. Levie Heritage who sees him regularly. Of note he had a negative nuclear stress test on 07/17/2003 clear.  3:43 PM Case discussed with Dr. Levie Heritage to include H & P, EKG findings, lab work and image results.  He recommends obtaining second set EKG and troponin and if negative discharging the patient home. Patient is to followup with him tomorrow afternoon.    Of note the patient is in no acute distress and his vital signs are stable at this time.  I will add a second set trop and EKG and H pylori study.  At this time I do not suspect acute myocardial infarction, aortic dissection, PE, intra-abdominal source, or other emergent etiology. Plan for second set troponin and EKG and discharge him with 5 Percocets.  4:41 PM VSS; Trop 0.11 (trending up) repeat EKG with STS changes Repaged cardiology (Dr. Levie Heritage)  @ 430 to discuss management and admission.  Awaiting call back.   Case d/w Dr. Levie Heritage who recommends admission and Heparin and NTG drips.  He will come to perform a bedside evaluation.    Pt t/o to Dr. Ethelda Chick pending admission and cards eval.  Repeat EKG if pain returns or worsens.     Darlys Gales, MD 07/29/13 [phone]

## 2013-07-29 NOTE — ED Notes (Addendum)
Pt stood up to urinate and began to feel a burning pain again in his sternum.  New EKG shown to Dr Sherri Sear and STEMI called.

## 2013-07-29 NOTE — ED Notes (Signed)
Dr. Harwani at bedside. 

## 2013-07-29 NOTE — Progress Notes (Signed)
Patient requesting his percoset ; refusing morphine.  Paged MD. Orders received.

## 2013-07-29 NOTE — ED Notes (Signed)
Pt arrives Via [facility] for Chest pain that started today as pt was cutting the grass. Upon EMS arrival, pt was pale and diaphoretic. Pt was complaining of tachypnea and was very anxious. Pt given 6 mg morphine en route and 2 nitro with no relief, pt unable to take Asprin due to no teeth reports EMS. Pt also reports low back pain yesterday but does not have any today. Pt has hx of stents placed but no hx of MI. EMS reports 12 lead, posterior, and right EKG unremarkable.

## 2013-07-29 NOTE — ED Provider Notes (Signed)
1736 shown EKG patient's pain slightly worse he rates as a 3 on a scale of 1-10 at present. Repeat EKG shows at 1730 6 PM shows an acute anterior wall STEMI. Code STEMI called  Doug Sou, MD 07/30/13 6962

## 2013-07-30 LAB — LIPID PANEL
HDL: 49 mg/dL (ref >39)
LDL Cholesterol: 107 mg/dL — ABNORMAL HIGH (ref 0–99)
Total CHOL/HDL Ratio: 3.5 RATIO
Triglycerides: 72 mg/dL (ref <150)
VLDL: 14 mg/dL (ref 0–40)

## 2013-07-30 LAB — CBC
HCT: 38.2 % — ABNORMAL LOW (ref 39.0–52.0)
Hemoglobin: 13.6 g/dL (ref 13.0–17.0)
MCH: 34.4 pg — ABNORMAL HIGH (ref 26.0–34.0)
MCHC: 35.6 g/dL (ref 30.0–36.0)
MCV: 96.7 fL (ref 78.0–100.0)
Platelets: 173 10*3/uL (ref 150–400)
RBC: 3.95 MIL/uL — ABNORMAL LOW (ref 4.22–5.81)
RDW: 13.8 % (ref 11.5–15.5)
WBC: 9.4 10*3/uL (ref 4.0–10.5)

## 2013-07-30 LAB — BASIC METABOLIC PANEL
CO2: 20 mEq/L (ref 19–32)
Calcium: 7.9 mg/dL — ABNORMAL LOW (ref 8.4–10.5)
Chloride: 107 mEq/L (ref 96–112)
Creatinine, Ser: 0.74 mg/dL (ref 0.50–1.35)
GFR calc Af Amer: >90 mL/min (ref >90)
GFR calc non Af Amer: >90 mL/min (ref >90)
Glucose, Bld: 122 mg/dL — ABNORMAL HIGH (ref 70–99)

## 2013-07-30 LAB — TROPONIN I: Troponin I: >20 ng/mL (ref <0.30)

## 2013-07-30 MED ORDER — LORAZEPAM 2 MG/ML IJ SOLN
1.0000 mg | Freq: Four times a day (QID) | INTRAMUSCULAR | Status: DC | PRN
  Administered 2013-07-30 – 2013-07-31 (×3): 1 mg via INTRAVENOUS
  Filled 2013-07-30 (×2): qty 1

## 2013-07-30 MED ORDER — OXYCODONE-ACETAMINOPHEN 5-325 MG PO TABS
1.0000 | ORAL_TABLET | Freq: Three times a day (TID) | ORAL | Status: DC | PRN
  Administered 2013-07-30 – 2013-08-02 (×6): 1 via ORAL
  Filled 2013-07-30 (×6): qty 1

## 2013-07-30 MED ORDER — ZOLPIDEM TARTRATE 5 MG PO TABS
5.0000 mg | ORAL_TABLET | Freq: Once | ORAL | Status: AC
  Administered 2013-07-30: 5 mg via ORAL
  Filled 2013-07-30: qty 1

## 2013-07-30 MED ORDER — ALUM & MAG HYDROXIDE-SIMETH 200-200-20 MG/5ML PO SUSP: 30.0000 mL | Freq: Four times a day (QID) | ORAL | Status: DC | PRN

## 2013-07-30 MED ORDER — PANTOPRAZOLE SODIUM 40 MG PO TBEC
40.0000 mg | DELAYED_RELEASE_TABLET | Freq: Every day | ORAL | Status: DC
  Administered 2013-07-30 – 2013-08-02 (×4): 40 mg via ORAL
  Filled 2013-07-30 (×4): qty 1

## 2013-07-30 MED ORDER — RAMIPRIL 1.25 MG PO CAPS
1.2500 mg | ORAL_CAPSULE | Freq: Every day | ORAL | Status: DC
  Administered 2013-07-30 – 2013-08-02 (×4): 1.25 mg via ORAL
  Filled 2013-07-30 (×4): qty 1

## 2013-07-30 MED ORDER — LORAZEPAM 2 MG/ML IJ SOLN
INTRAMUSCULAR | Status: AC
  Filled 2013-07-30: qty 1

## 2013-07-30 MED FILL — Sodium Chloride IV Soln 0.9%: INTRAVENOUS | Qty: 50 | Status: AC

## 2013-07-30 NOTE — Progress Notes (Signed)
C/P chest burning, same as pain before stent, ntg gtts restarted with pain relief, Dr Sharyn Lull notified and orders received.

## 2013-07-30 NOTE — Progress Notes (Signed)
Femoral Sheath Removal Note  Femoral sheath in Rt groin post PCI procedure. Site level 0, all surrounding tissue soft to palpation, pedal pulses 2+.  Sheath removed and manual pressure held x31mins. VSS, pt tolerated well. Post removal pedal pulses 2+, site Level 0. Post instructions given. Family at bedside, will continue to monitor closely.

## 2013-07-30 NOTE — Progress Notes (Signed)
Cardiac Rehab 1515 Started MI education with pt and daughter. Discussed MI, stent and [place]. Pt voices understanding. Pt upset and is frightened that he has heart damage, gave pt emotional support. Beatrix Fetters 3:23 PM 07/30/2013

## 2013-07-30 NOTE — Cardiovascular Report (Signed)
Peter Booker, Peter Booker            ACCOUNT NO.:  [id number]  MEDICAL RECORD NO.:  [id number]  LOCATION:  2H11C                        FACILITY:  [facility]  PHYSICIAN:  Velicia Dejager N. Sharyn Lull, M.D. DATE OF BIRTH:  03-03-1951  DATE OF PROCEDURE:  07/29/2013 DATE OF DISCHARGE:                           CARDIAC CATHETERIZATION   PROCEDURES: 1. Left cardiac cath with selective left and right coronary     angiography, measurement of LVEDP via right groin using Judkins     technique. 2. Successful PTCA to proximal LAD using initially 2.5 x 12-mm long     Sprinter balloon and then 2.5 x 20 mm long Trek balloon. 3. Successful deployment of 3.0 x 33 mm long Xience Xpedition drug-     eluting stent in proximal LAD. 4. Successful postdilatation of this stent using 3.25 x 15 mm long [place]     Quantum Apex balloon.  INDICATION FOR THE PROCEDURE:  Mr. Bail is a 62 year old male with past medical history significant for coronary artery disease status post PTCA stenting to proximal LAD more than 10 years ago, hypertension, tobacco abuse, anxiety disorder, GERD, strong family history of coronary artery disease, all his brothers had MI's in their 24s, he came to the ER complaining of recurrent retrosternal chest pain, described as burning, grade 10/10, radiating to the neck and back without associated symptoms.  Initially, EKG done in the ER showed normal sinus rhythm and minor ST-T wave changes in inferolateral leads.  The patient subsequently had a CT angio of the chest which was negative for PE or dissection.  Repeat EKG done due to recurrent chest pain showed normal sinus rhythm with Q-waves in lead V1 to V4 with marked ST elevation, which were new as compared to prior  EKG.  Code STEMI was called.  The patient states has been having recurrent vague chest pain for a few weeks.  The patient had nuclear stress test on July 10, 2013, approximately 3 weeks ago, which was negative for ischemia with EF  of 54%.  Discussed with the patient and family briefly regarding emergency left cath, possible PTCA stenting, its risks and benefits, i.e., death, MI, stroke, need for emergency CABG, local vascular complications, etc. and consented for PCI.  DESCRIPTION OF PROCEDURE:  After obtaining the informed consent, patient was brought to the cath lab and was placed on fluoroscopy table.  Right groin was prepped and draped in usual fashion.  Xylocaine 1% was used for local anesthesia in the right groin.  With the help of thin wall needle, 6-French arterial sheath was placed.  The sheath was aspirated and flushed.  A 6-French left Judkins catheter was advanced over the wire under fluoroscopic guidance up to the ascending aorta.  Wire was pulled out. The catheter was aspirated and connected to the Manifold.  Catheter was further advanced and engaged into left coronary ostium.  A single view of the left system was taken.  Catheter was disengaged and was pulled out over the wire and was replaced with 6-French right Judkins catheter, which was advanced over the wire under fluoroscopic guidance up to the ascending aorta.  Wire was pulled out, the catheter was aspirated, and connected to the Manifold.  Catheter  was further advanced and engaged into right coronary ostium.  A single view of right coronary artery was obtained.  The catheter was disengaged and was pulled out over the wire and was replaced with 6-French pigtail catheter at the end of the procedure, which was advanced over the wire under fluoroscopic guidance up to the ascending aorta.  Wire was pulled out.  The catheter was aspirated and connected to the Manifold.  Catheter was further advanced and across the aortic valve into the LV.  LV pressures were recorded.  LVEDP was 35 mmHg.  The pigtail catheter was pulled out into the aorta.  Aortic pressures were recorded.  There was no gradient across aortic valve.  Pigtail catheter was  pulled out over the wire.  Sheaths were aspirated and flushed.  FINDINGS:  Left main showed 40%-50% ostial and 50-60% distal left main stenosis.  LAD was 100% occluded beyond the proximal portion.  Left circumflex has 20-25% ostial and proximal stenosis and then tapers down in the AV groove.  OM1 is moderate sized, which has 25% to 30% proximal and mid stenosis.  OM2 has 50%-60% mid stenosis.  RCA was 100% occluded at the ostium, which appears to be chronically occluded.  INTERVENTIONAL PROCEDURE:  Successful PTCA to proximal 100% occluded LAD was done using initially 2.5 x 12-mm long Sprinter balloon and then 2.5 x 20-mm long Trek balloon for predilatation and then 3.0 x 33 mm long Xience Xpedition drug-eluting stent was deployed.  At 11 atmospheric pressure, the stent was post dilated using 3.25 x 15 mm long [place] Quantum Apex balloon going up to 18 atmospheric pressure.  Lesion dilated from 100% to 0% residual with excellent TIMI grade 3 distal flow and also visualization of collaterals to the RCA.  The patient received weight based Angiomax 180 mg of Brilinta prior to the procedure.  The patient also was started on dopamine during the procedure, as the patient had episode of hypotension with blood pressure in the 70s.  Postprocedure blood pressure is above 120s systolic, dopamine is being weaned off. The patient tolerated the procedure well.  There were no complications. The patient was transferred to CCU in stable condition.     Eduardo Osier. Sharyn Lull, M.D.     [facility]  D:  07/29/2013  T:  07/30/2013  Job:  [id number]

## 2013-07-30 NOTE — Care Management Note (Addendum)
    Page 1 of 1   08/02/2013     4:22:25 PM   CARE MANAGEMENT NOTE 08/02/2013  Patient:  Peter Booker, Peter Booker   Account Number:  [id number]  Date Initiated:  07/30/2013  Documentation initiated by:  Junius Creamer  Subjective/Objective Assessment:   adm w ch pain, mi     Action/Plan:   lives w fam, pcp dr Sharyn Lull   Anticipated DC Date:  08/02/2013   Anticipated DC Plan:  HOME/SELF CARE      DC Planning Services  CM consult  Medication Assistance      Choice offered to / List presented to:             Status of service:  Completed, signed off Medicare Important Message given?   (If response is "NO", the following Medicare IM given date fields will be blank) Date Medicare IM given:   Date Additional Medicare IM given:    Discharge Disposition:  HOME/SELF CARE  Per UR Regulation:  Reviewed for med. necessity/level of care/duration of stay  If discussed at Long Length of Stay Meetings, dates discussed:    Comments:  08/02/13-  1200- Donn Pierini RN, BSN [phone] Pt discharged home on Brilinta- needs Medicaid pre-auth- called in pre-auth to Medicaid per Dr. Annitta Jersey request form that he filled out- auth. received from 08/02/13-07/28/14 ref. #[id number]- called [facility]. to inform and per pharmacist pt had already picked up drug without difficulty with $3 copay  10/14 1330 debbie dowell rn,bsn gave pt brilinta 30day free and copay assist card. pt has mediciaid. brilinta on non preferred list so needs prior auth. prior auth form for medicaid placed on shadow chart for md to fill out.

## 2013-07-30 NOTE — Progress Notes (Signed)
Subjective:  Patient denies any chest pains or shortness of breath. BP remained low beta blockers and ACE inhibitors are being held. Patient is off dopamine  Objective:  Vital Signs in the last 24 hours: Temp:  [97 F (36.1 C)-97.5 F (36.4 C)] 97.5 F (36.4 C) (10/14 0400) Pulse Rate:  [66-155] 68 (10/14 0700) Resp:  [12-32] 23 (10/14 0600) BP: (82-145)/(45-102) 83/56 mmHg (10/14 0700) SpO2:  [93 %-100 %] 94 % (10/14 0700) FiO2 (%):  [28 %] 28 % (10/13 2025) Weight:  [86.5 kg (190 lb 11.2 oz)] 86.5 kg (190 lb 11.2 oz) (10/13 2000)  Intake/Output from previous day: 10/13 0701 - 10/14 0700 In: 1994.2 [P.O.:480; I.V.:1514.2] Out: 4350 [Urine:4200; Emesis/NG output:150] Intake/Output from this shift:    Physical Exam: Neck: no adenopathy, no carotid bruit, no JVD and supple, symmetrical, trachea midline Lungs: clear to auscultation bilaterally Heart: regular rate and rhythm, S1, S2 normal and Soft systolic murmur and S3 gallop noted Abdomen: soft, non-tender; bowel sounds normal; no masses,  no organomegaly Extremities: extremities normal, atraumatic, no cyanosis or edema and Right groin stable  Lab Results:  Recent Labs  07/29/13 2055 07/30/13 0225  WBC 10.4 9.4  HGB 15.2 13.6  PLT 197 173    Recent Labs  07/29/13 2055 07/30/13 0225  NA 138 141  K 3.5 3.8  CL 103 107  CO2 18* 20  GLUCOSE 136* 122*  BUN 13 13  CREATININE 0.71 0.74    Recent Labs  07/30/13 0210 07/30/13 0829  TROPONINI >20.00* >20.00*   Hepatic Function Panel  Recent Labs  07/29/13 2055  PROT 7.0  ALBUMIN 3.7  AST 366*  ALT 95*  ALKPHOS 68  BILITOT 0.4    Recent Labs  07/30/13 0225  CHOL 170   No results found for this basename: PROTIME,  in the last 72 hours  Imaging: Imaging results have been reviewed and Ct Angio Chest W/cm &/or Wo Cm  07/29/2013   CLINICAL DATA:  Chest pain, tachypneic, diaphoresis  EXAM: CT ANGIOGRAPHY CHEST WITH CONTRAST  TECHNIQUE: Multidetector CT  imaging of the chest was performed using the standard protocol during bolus administration of intravenous contrast. Multiplanar CT image reconstructions including MIPs were obtained to evaluate the vascular anatomy.  CONTRAST:  OMNIPAQUE IOHEXOL 350 MG/ML SOLN  COMPARISON:  Chest radiograph dated 07/29/2013  FINDINGS: No evidence of pulmonary embolism.  Mild dependent atelectasis in the bilateral lower lobes. No pleural effusion or pneumothorax.  Visualized thyroid is unremarkable.  The heart is normal in size. No pericardial effusion. Coronary atherosclerosis. Mild atherosclerotic calcifications of the aortic arch.  Small mediastinal/right perihilar nodes which do not meet pathologic CT size criteria.  Visualized upper abdomen is notable for a mildly nodular hepatic contour.  Degenerative changes of the visualized thoracolumbar spine.  Review of the MIP images confirms the above findings.  IMPRESSION: No evidence of pulmonary embolism.  No evidence of acute cardiopulmonary disease.   Electronically Signed   By: Charline Bills M.D.   On: 07/29/2013 14:12   Dg Chest Port 1 View  07/29/2013   CLINICAL DATA:  Chest pain.  EXAM: PORTABLE CHEST - 1 VIEW  COMPARISON:  07/11/2013  FINDINGS: The heart size and mediastinal contours are within normal limits. Both lungs are clear. The visualized skeletal structures are unremarkable.  IMPRESSION: No active disease.   Electronically Signed   By: Geanie Cooley M.D.   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   LOS: 1 day    Keiton Cosma N 07/30/2013, 9:48 AM

## 2013-07-30 NOTE — Progress Notes (Signed)
  Echocardiogram 2D Echocardiogram has been performed.  Jory Tanguma FRANCES 07/30/2013, 11:14 AM

## 2013-07-31 ENCOUNTER — Encounter (HOSPITAL_COMMUNITY): Payer: Self-pay | Admitting: *Deleted

## 2013-07-31 LAB — BASIC METABOLIC PANEL
BUN: 14 mg/dL (ref 6–23)
CO2: 26 mEq/L (ref 19–32)
Chloride: 104 mEq/L (ref 96–112)
Creatinine, Ser: 0.96 mg/dL (ref 0.50–1.35)
Potassium: 4.3 mEq/L (ref 3.5–5.1)

## 2013-07-31 LAB — CBC
HCT: 40.2 % (ref 39.0–52.0)
Hemoglobin: 13.9 g/dL (ref 13.0–17.0)
MCH: 33.9 pg (ref 26.0–34.0)
MCV: 98 fL (ref 78.0–100.0)
RBC: 4.1 MIL/uL — ABNORMAL LOW (ref 4.22–5.81)
RDW: 14 % (ref 11.5–15.5)
WBC: 10.6 10*3/uL — ABNORMAL HIGH (ref 4.0–10.5)

## 2013-07-31 MED ORDER — WHITE PETROLATUM GEL
Status: AC
  Administered 2013-07-31: 16:00:00
  Filled 2013-07-31: qty 5

## 2013-07-31 MED ORDER — CARVEDILOL 3.125 MG PO TABS
3.1250 mg | ORAL_TABLET | Freq: Two times a day (BID) | ORAL | Status: DC
  Administered 2013-07-31 – 2013-08-02 (×4): 3.125 mg via ORAL
  Filled 2013-07-31 (×7): qty 1

## 2013-07-31 NOTE — Progress Notes (Signed)
Patient instructed and reminded multiple times to call nurse if he wants/needs to get up.  Patient seen up walking around in room and hallway without calling nurse.

## 2013-07-31 NOTE — Progress Notes (Signed)
[id number] Cardiac Rehab Completed MI, stent and CHF education with pt. He voices understanding. We discussed smoking cessation with pt. I gave him tips for quitting and coaching contact number. He states that he will never put a cigarette in him mouth," I am done" Gave pt CHF packet and discussed zones. Pt states that he eats out almost every meal, stating that I do not use salt. I doubt compliance with diet, but encouraged it.We did discuss Outpt. CRP, he agrees to referral to [place]. Beatrix Fetters 1:43 PM 07/31/2013

## 2013-07-31 NOTE — Progress Notes (Signed)
Subjective:  Patient denies any chest pain or shortness of breath states feels much better and ready to go home  Objective:  Vital Signs in the last 24 hours: Temp:  [97.8 F (36.6 C)-98.2 F (36.8 C)] 97.8 F (36.6 C) (10/15 0800) Pulse Rate:  [69-87] 80 (10/15 0800) Resp:  [14-25] 18 (10/15 1000) BP: (87-175)/(41-88) 102/76 mmHg (10/15 1000) SpO2:  [92 %-98 %] 95 % (10/15 1000)  Intake/Output from previous day: 10/14 0701 - 10/15 0700 In: 1833 [P.O.:480; I.V.:1353] Out: 500 [Urine:500] Intake/Output from this shift: Total I/O In: 240 [P.O.:240] Out: 250 [Urine:250]  Physical Exam: Neck: no adenopathy, no carotid bruit, no JVD and supple, symmetrical, trachea midline Lungs: clear to auscultation bilaterally Heart: regular rate and rhythm, S1, S2 normal and Soft systolic murmur noted Abdomen: soft, non-tender; bowel sounds normal; no masses,  no organomegaly Extremities: extremities normal, atraumatic, no cyanosis or edema and Right groin stable  Lab Results:  Recent Labs  07/30/13 0225 07/31/13 0420  WBC 9.4 10.6*  HGB 13.6 13.9  PLT 173 176    Recent Labs  07/30/13 0225 07/31/13 0420  NA 141 138  K 3.8 4.3  CL 107 104  CO2 20 26  GLUCOSE 122* 105*  BUN 13 14  CREATININE 0.74 0.96    Recent Labs  07/30/13 0829 07/31/13 0420  TROPONINI >20.00* >20.00*   Hepatic Function Panel  Recent Labs  07/29/13 2055  PROT 7.0  ALBUMIN 3.7  AST 366*  ALT 95*  ALKPHOS 68  BILITOT 0.4    Recent Labs  07/30/13 0225  CHOL 170   No results found for this basename: PROTIME,  in the last 72 hours  Imaging: Imaging results have been reviewed and Ct Angio Chest W/cm &/or Wo Cm  07/29/2013   CLINICAL DATA:  Chest pain, tachypneic, diaphoresis  EXAM: CT ANGIOGRAPHY CHEST WITH CONTRAST  TECHNIQUE: Multidetector CT imaging of the chest was performed using the standard protocol during bolus administration of intravenous contrast. Multiplanar CT image  reconstructions including MIPs were obtained to evaluate the vascular anatomy.  CONTRAST:  OMNIPAQUE IOHEXOL 350 MG/ML SOLN  COMPARISON:  Chest radiograph dated 07/29/2013  FINDINGS: No evidence of pulmonary embolism.  Mild dependent atelectasis in the bilateral lower lobes. No pleural effusion or pneumothorax.  Visualized thyroid is unremarkable.  The heart is normal in size. No pericardial effusion. Coronary atherosclerosis. Mild atherosclerotic calcifications of the aortic arch.  Small mediastinal/right perihilar nodes which do not meet pathologic CT size criteria.  Visualized upper abdomen is notable for a mildly nodular hepatic contour.  Degenerative changes of the visualized thoracolumbar spine.  Review of the MIP images confirms the above findings.  IMPRESSION: No evidence of pulmonary embolism.  No evidence of acute cardiopulmonary disease.   Electronically Signed   By: Charline Bills M.D.   On: 07/29/2013 14:12   Dg Chest Port 1 View  07/29/2013   CLINICAL DATA:  Chest pain.  EXAM: PORTABLE CHEST - 1 VIEW  COMPARISON:  07/11/2013  FINDINGS: The heart size and mediastinal contours are within normal limits. Both lungs are clear. The visualized skeletal structures are unremarkable.  IMPRESSION: No active disease.   Electronically Signed   By: Geanie Cooley M.D.   On: 07/29/2013 13:36    Cardiac Studies:  Assessment/Plan:  Status post large anterolateral wall myocardial infarction status post PCI to 100% occluded LAD with excellent results  History of silent inferior wall MI  Moderate left main stenosis  Hypertension  Hypercholesteremia  Tobacco abuse  Strong family history of coronary artery disease  Anxiety disorder  GERD  Degenerative joint disease Plan Start carvedilol as per orders Continue ACE inhibitors Check labs in a.m. Discussed with patient regarding lifestyle modification diet exercise and compliance with medication and smoking cessation  LOS: 2 days     Peter Booker N 07/31/2013, 12:02 PM

## 2013-07-31 NOTE — Progress Notes (Addendum)
CARDIAC REHAB PHASE I   PRE:  Rate/Rhythm: 71 SR  BP:  Supine: 102/76  Sitting:   Standing:    SaO2: 95 RA  MODE:  Ambulation: 350 ft   POST:  Rate/Rhythm: 95 SR  BP:  Supine:   Sitting: 129/95  Standing:    SaO2: 98 RA 1010-1035 On arrival pt in bed dozing. Assisted X 1 to ambulate. Gait steady. Pt is DOE on any exertion with movement in bed and with walking. Room air sat after walk 98%.He denies any chest pain or burning. Pt to recliner after walk with call light in reach. Visitor present after walk. We wil continue to follow pt.  Melina Copa RN 07/31/2013 10:34 AM

## 2013-08-01 LAB — BASIC METABOLIC PANEL
CO2: 25 mEq/L (ref 19–32)
Calcium: 8.9 mg/dL (ref 8.4–10.5)
Chloride: 103 mEq/L (ref 96–112)
GFR calc non Af Amer: 90 mL/min — ABNORMAL LOW (ref >90)
Glucose, Bld: 110 mg/dL — ABNORMAL HIGH (ref 70–99)
Potassium: 3.7 mEq/L (ref 3.5–5.1)
Sodium: 138 mEq/L (ref 135–145)

## 2013-08-01 LAB — CBC
Hemoglobin: 13.5 g/dL (ref 13.0–17.0)
MCH: 33.6 pg (ref 26.0–34.0)
MCV: 98.5 fL (ref 78.0–100.0)
Platelets: 188 10*3/uL (ref 150–400)
RBC: 4.02 MIL/uL — ABNORMAL LOW (ref 4.22–5.81)
WBC: 9.7 10*3/uL (ref 4.0–10.5)

## 2013-08-01 LAB — TROPONIN I: Troponin I: >20 ng/mL (ref <0.30)

## 2013-08-01 MED ORDER — SPIRONOLACTONE 12.5 MG HALF TABLET
12.5000 mg | ORAL_TABLET | Freq: Every day | ORAL | Status: DC
  Administered 2013-08-01 – 2013-08-02 (×2): 12.5 mg via ORAL
  Filled 2013-08-01 (×2): qty 1

## 2013-08-01 MED ORDER — ROSUVASTATIN CALCIUM 40 MG PO TABS
40.0000 mg | ORAL_TABLET | Freq: Every day | ORAL | Status: DC
  Administered 2013-08-01: 40 mg via ORAL
  Filled 2013-08-01 (×3): qty 1

## 2013-08-01 MED ORDER — ONDANSETRON HCL 4 MG/2ML IJ SOLN: 4.0000 mg | Freq: Four times a day (QID) | INTRAMUSCULAR | Status: DC

## 2013-08-01 MED ORDER — ONDANSETRON HCL 4 MG/2ML IJ SOLN
INTRAMUSCULAR | Status: AC
  Filled 2013-08-01: qty 2

## 2013-08-01 MED ORDER — ONDANSETRON HCL 4 MG/2ML IJ SOLN
4.0000 mg | Freq: Four times a day (QID) | INTRAMUSCULAR | Status: DC | PRN
  Administered 2013-08-01 – 2013-08-02 (×2): 4 mg via INTRAVENOUS
  Filled 2013-08-01: qty 2

## 2013-08-01 NOTE — Progress Notes (Signed)
CARDIAC REHAB PHASE I   PRE:  Rate/Rhythm: 73 SR  BP:  Supine: 113/78  Sitting:   Standing:    SaO2: 96 RA  MODE:  Ambulation: 700 ft   POST:  Rate/Rhythm: 96 SR  BP:  Supine:   Sitting: 135/75  Standing:    SaO2: 93 RA 1155-1230 Assisted X 1 to ambulate. Pt c/o of pain in right ankle,he states that it woke him up hurting and hurts with weight bearing. Pt limped on ankle but was able to walk 700 feet. He denies any chest burning, he is DOE RA sat after walk 93%. Pt back to bed after walk with call light in reach. Completed MI education with pt. He voices understanding.  Melina Copa RN 08/01/2013 12:26 PM

## 2013-08-01 NOTE — Progress Notes (Signed)
Subjective:  Patient denies any chest pain complains of shortness of breath. Blood pressure has improved states overall feels better and is eager to go home. Troponin I is still remains elevated about 20  Objective:  Vital Signs in the last 24 hours: Temp:  [98 F (36.7 C)-98.7 F (37.1 C)] 98.4 F (36.9 C) (10/16 0829) Pulse Rate:  [76-83] 83 (10/16 0829) Resp:  [18-20] 20 (10/15 1349) BP: (101-149)/(64-93) 121/70 mmHg (10/16 0829) SpO2:  [94 %-99 %] 97 % (10/16 0829)  Intake/Output from previous day: 10/15 0701 - 10/16 0700 In: 600 [P.O.:600] Out: 250 [Urine:250] Intake/Output from this shift:    Physical Exam: Neck: no adenopathy, no carotid bruit, no JVD and supple, symmetrical, trachea midline Lungs: Decreased breath sound at bases Heart: regular rate and rhythm, S1, S2 normal and Soft systolic murmur noted Abdomen: soft, non-tender; bowel sounds normal; no masses,  no organomegaly Extremities: extremities normal, atraumatic, no cyanosis or edema and Right groin stable  Lab Results:  Recent Labs  07/31/13 0420 08/01/13 0522  WBC 10.6* 9.7  HGB 13.9 13.5  PLT 176 188    Recent Labs  07/31/13 0420 08/01/13 0522  NA 138 138  K 4.3 3.7  CL 104 103  CO2 26 25  GLUCOSE 105* 110*  BUN 14 12  CREATININE 0.96 0.92    Recent Labs  07/31/13 0420 08/01/13 0523  TROPONINI >20.00* >20.00*   Hepatic Function Panel  Recent Labs  07/29/13 2055  PROT 7.0  ALBUMIN 3.7  AST 366*  ALT 95*  ALKPHOS 68  BILITOT 0.4    Recent Labs  07/30/13 0225  CHOL 170   No results found for this basename: PROTIME,  in the last 72 hours  Imaging: Imaging results have been reviewed and No results found.  Cardiac Studies:  Assessment/Plan:  Status post large anterolateral wall myocardial infarction status post PCI to 100% occluded LAD with excellent results  History of silent inferior wall MI  Moderate left main stenosis  Hypertension  Hypercholesteremia  Tobacco  abuse  Strong family history of coronary artery disease  Anxiety disorder  GERD  Degenerative joint disease  Plan Add low-dose Aldactone. Check labs in a.m. Possible discharge tomorrow Okay to transfer to telemetry  LOS: 3 days    Erhardt Dada N 08/01/2013, 9:40 AM

## 2013-08-01 NOTE — Progress Notes (Signed)
Patient transferred to 1O10.  RN received pt in room.

## 2013-08-02 LAB — CBC
HCT: 39.2 % (ref 39.0–52.0)
Hemoglobin: 13.4 g/dL (ref 13.0–17.0)
MCV: 97.5 fL (ref 78.0–100.0)
Platelets: 173 10*3/uL (ref 150–400)
RDW: 13.8 % (ref 11.5–15.5)
WBC: 8 10*3/uL (ref 4.0–10.5)

## 2013-08-02 LAB — BASIC METABOLIC PANEL
BUN: 15 mg/dL (ref 6–23)
CO2: 22 mEq/L (ref 19–32)
Chloride: 106 mEq/L (ref 96–112)
Creatinine, Ser: 0.86 mg/dL (ref 0.50–1.35)
GFR calc Af Amer: >90 mL/min (ref >90)
Glucose, Bld: 137 mg/dL — ABNORMAL HIGH (ref 70–99)
Potassium: 3.4 mEq/L — ABNORMAL LOW (ref 3.5–5.1)

## 2013-08-02 LAB — CK TOTAL AND CKMB (NOT AT ARMC): Relative Index: 2.4 (ref 0.0–2.5)

## 2013-08-02 LAB — TROPONIN I: Troponin I: 14.58 ng/mL (ref <0.30)

## 2013-08-02 MED ORDER — CARVEDILOL 3.125 MG PO TABS: 3.1250 mg | ORAL_TABLET | Freq: Two times a day (BID) | ORAL | Status: DC

## 2013-08-02 MED ORDER — TICAGRELOR 90 MG PO TABS: 90.0000 mg | ORAL_TABLET | Freq: Two times a day (BID) | ORAL | Status: DC

## 2013-08-02 MED ORDER — ZOLPIDEM TARTRATE ER 6.25 MG PO TBCR: 6.2500 mg | EXTENDED_RELEASE_TABLET | Freq: Every evening | ORAL | Status: DC | PRN

## 2013-08-02 MED ORDER — SPIRONOLACTONE 12.5 MG HALF TABLET: 12.5000 mg | ORAL_TABLET | Freq: Every day | ORAL | Status: DC

## 2013-08-02 MED ORDER — ROSUVASTATIN CALCIUM 20 MG PO TABS: 20.0000 mg | ORAL_TABLET | Freq: Every day | ORAL | Status: DC

## 2013-08-02 MED ORDER — RAMIPRIL 1.25 MG PO CAPS: 1.2500 mg | ORAL_CAPSULE | Freq: Every day | ORAL | Status: DC

## 2013-08-02 NOTE — Discharge Summary (Signed)
Peter Booker, Peter Booker            ACCOUNT NO.:  [id number]  MEDICAL RECORD NO.:  [id number]  LOCATION:  3W21C                        FACILITY:  [facility]  PHYSICIAN:  Peter Booker, M.D. DATE OF BIRTH:  12/18/1950  DATE OF ADMISSION:  07/29/2013 DATE OF DISCHARGE:                              DISCHARGE SUMMARY   ADMITTING DIAGNOSES: 1. Acute anterolateral wall myocardial infarction. 2. Coronary artery disease, status post percutaneous coronary     intervention to proximal left anterior descending in remote past. 3. Hypertension. 4. Tobacco abuse. 5. Strong family history of coronary artery disease. 6. Gastroesophageal reflux disease. 7. Anxiety disorder.  FINAL DIAGNOSES: 1. Status post large anterolateral wall myocardial infarction status     post percutaneous transluminal coronary angioplasty stenting to     proximal left anterior descending. 2. History of silent inferior wall myocardial infarction. 3. Coronary artery disease, status post percutaneous coronary     intervention to proximal left anterior descending in remote past. 4. Hypertension. 5. Tobacco abuse. 6. Alcohol abuse. 7. Positive family history of coronary artery disease. 8. Gastroesophageal reflux disease. 9. Anxiety disorder. 10.Hypercholesteremia. 11.Glucose intolerance, rule out diabetes mellitus.  DISCHARGE HOME MEDICATIONS: 1. Carvedilol 3.125 mg 1 tablet twice daily. 2. Ramipril 1.25 mg 1 capsule daily. 3. Crestor 20 mg 1 tablet daily. 4. Spironolactone 12.5 mg daily. 5. Brilinta 90 mg twice daily. 6. Ambien 6.25 mg 1 tablet daily at night as needed. 7. Xanax 0.5 mg twice daily. 8. Aspirin 81 mg 1 tablet daily. 9. Nexium 40 mg 1 tablet daily. 10.Nitrostat sublingual use as directed. 11.The patient has been advised to stop clopidogrel, lisinopril,     metoprolol succinate, oxycodone, and simvastatin.  DIET:  Low salt, low cholesterol.  ACTIVITY:  Increase activity slowly as tolerated.   Post-PTCA stent instructions have been given.  Follow up with me in 1 week.  The patient is scheduled for phase 2 cardiac rehab as outpatient.  The patient has been advised to refrain from smoking and alcohol.  CONDITION AT DISCHARGE:  Stable.  BRIEF HISTORY AND HOSPITAL COURSE:  Peter Booker is 62 year old male with past medical history significant for coronary artery disease, status post PTCA stenting to proximal LAD more than 10 years ago, hypertension, tobacco abuse, anxiety disorder, GERD, strong family history of coronary artery disease.  All his brothers had MI in their 75s.  He came to the ER complaining of recurrent retrosternal chest pain described as burning, grade 10/10 radiating to neck and back without associated symptoms.  Initial EKG done in the ER showed normal sinus rhythm with minor ST-T wave changes in inferolateral leads.  Repeat EKG done showed sinus rhythm with Q-waves in lead V1 to V4 with ST elevation, which were new as compared to prior EKG.  Code STEMI was called.  The patient has been having recurrent, vague chest pain off and on for the last few weeks.  The patient had nuclear stress test approximately 3 weeks ago, which was negative for ischemia with EF of 64%.  PAST MEDICAL HISTORY:  As above.  PAST SURGICAL HISTORY:  He had bilateral shoulder surgery in the past. Had PTCA stenting to proximal LAD in the past.  PHYSICAL EXAMINATION:  GENERAL:  He was alert, awake, oriented x3, in no acute distress. VITAL SIGNS:  Blood pressure was 115/69, pulse was 76.  She was afebrile. HEENT:  Conjunctivae was pink. NECK:  Supple.  No JVD.  No bruit. LUNGS:  Clear to auscultation without rhonchi or rales. CARDIOVASCULAR:  S1, S2 was normal.  There was soft systolic murmur and S4 gallop. ABDOMEN:  Soft.  Bowel sounds were present.  Nontender. EXTREMITIES:  No clubbing, cyanosis, or edema.  LABORATORY DATA:  His sodium was 143, potassium 3.6, BUN 18,  creatinine 1.10.  His blood sugar was 111.  His first set of troponin-I point of care 0.00, repeat was 0.11.  Hemoglobin was 15, hematocrit 41.1, white count of 10.7.  His repeat troponin I was about 20, post PCI that has been 20 x3.  Today, troponin I is trending down to 14.58.  CK is 302 and MB is 7.2.  His hemoglobin today is 13.4, hematocrit 39.2, white count of 8.0.  Today, sodium 138, potassium 3.4, BUN 15, creatinine 0.86. Glucose is mildly elevated 137.  BRIEF HOSPITAL COURSE:  The patient was directly taken to the cardiac cath lab and underwent emergency left cardiac cath with selective left and right coronary angiography and PTCA stenting to 100% occluded LAD. The patient tolerated the procedure well.  The patient did not had any further episodes of anginal chest pain during the hospital stay.  His groin is stable with no evidence of hematoma or bruit.  Phase 1 cardiac rehab was called.  The patient has been ambulating in hallway without any problems.  The patient will be discharged home on above medications and will be followed up in my office in 1 week.  We will repeat his 2-D echo as outpatient in few months.  If his EF remains below 35%, we will refer him for possible evaluation for ICD.  In the mean time, we will increase his beta-blockers and ACE inhibitors as blood pressure tolerates.  The patient has strongly been encouraged to refrain from drinking alcohol and smoking cessation to which he agrees.  The patient will be discharged home on above medications and will be followed up in my office in 1 week.     Peter Booker. Sharyn Booker, M.D.     [facility]  D:  08/02/2013  T:  08/02/2013  Job:  [id number]

## 2013-08-02 NOTE — Discharge Summary (Signed)
  Discharge summary dictated on 08/02/2013 dictation number is [phone]

## 2013-08-22 ENCOUNTER — Encounter (HOSPITAL_COMMUNITY)
Admission: RE | Admit: 2013-08-22 | Discharge: 2013-08-22 | Disposition: A | Discharge status: Home / Self Care | Payer: Medicaid Other | Source: Ambulatory Visit | Attending: Cardiology | Admitting: Cardiology

## 2013-08-22 DIAGNOSIS — Z9861 Coronary angioplasty status: Secondary | ICD-10-CM | POA: Insufficient documentation

## 2013-08-22 DIAGNOSIS — Z5189 Encounter for other specified aftercare: Secondary | ICD-10-CM | POA: Insufficient documentation

## 2013-08-22 DIAGNOSIS — I2109 ST elevation (STEMI) myocardial infarction involving other coronary artery of anterior wall: Secondary | ICD-10-CM | POA: Insufficient documentation

## 2013-08-22 NOTE — Progress Notes (Signed)
Patient informed the pharmacist he is out of some of his medications. Mr Dykman did not bring his bottles with him. The patient is going to go home and look at his bottles and call me back to review his medications over the phone. I reinforced to the patient that he needs to take his Brilinta twice a day as prescribed.   Patient given a card to receive a 30 day supply of brilinta.

## 2013-08-22 NOTE — Progress Notes (Signed)
Cardiac Rehab Medication Review by a Pharmacist  Does the patient  feel that his/her medications are working for him/her?  no  Has the patient been experiencing any side effects to the medications prescribed?  Patient is not feeling well, but unsure if this is from medications or the heart attack  Does the patient measure his/her own blood pressure or blood glucose at home?  no   Does the patient have any problems obtaining medications due to transportation or finances?   yes  Understanding of regimen: poor Understanding of indications: poor Potential of compliance: poor    Pharmacist comments: Patient is very depressed about his situation. He has poor understanding of his medications and states he has been out of a few of his medications for several days to weeks even though he claims to have picked them up on the Oct 17th.  He states he has difficulty obtaining the medications because [facility] will not transfer the prescriptions and he is out of work and unable to afford them.  He also states that there is no point in the medications because they can not undo his damage.  I informed the nurse that he was out of his medications and I was concerned for him.  She worked with him to get a free 30-day supply card for the Brilinta and will have the patient call once home to assess his supply of medications.  Patient will likely need a lot of follow-up and education.  He will need encouragement to take his medications.   Thank you, Piedad Climes, PharmD Clinical Pharmacist - Resident Pager: [phone] Pharmacy: [phone] 08/22/2013 9:47 AM

## 2013-08-26 ENCOUNTER — Telehealth (HOSPITAL_COMMUNITY): Payer: Self-pay | Admitting: Cardiac Rehabilitation

## 2013-08-26 ENCOUNTER — Encounter (HOSPITAL_COMMUNITY): Payer: Medicaid Other

## 2013-08-26 NOTE — Telephone Encounter (Addendum)
pc to pt to assess reason for absence from cardiac rehab today.  Pt states he was unsure if he was to attend today.  Pt plans to come on Wednesday 08/28/13 @800  am

## 2013-08-28 ENCOUNTER — Encounter (HOSPITAL_COMMUNITY)
Admission: RE | Admit: 2013-08-28 | Discharge: 2013-08-28 | Disposition: A | Discharge status: Home / Self Care | Payer: Medicaid Other | Source: Ambulatory Visit | Attending: Cardiology | Admitting: Cardiology

## 2013-08-28 ENCOUNTER — Encounter (HOSPITAL_COMMUNITY): Payer: Medicaid Other

## 2013-08-28 ENCOUNTER — Encounter (HOSPITAL_COMMUNITY): Payer: Self-pay

## 2013-08-28 NOTE — Progress Notes (Signed)
Pt started cardiac rehab today.  Pt tolerated light exercise without difficulty.  VSS, telemetry-NSR, mild dyspnea on exertion.  Medication list reconciled.  Pt states he was previously out of spironolactone however he contacted Dr. Sharyn Lull for refill and has been taking daily.  In addition, pt states he is out of [place] but will not be able to refill until 09/02/13 due to medicaid restrictions. Pt denies missed doses or difficulty obtaining other medications.  PHQ-6, pt denies depression, however exhibits situational stress and anxiety of health related nature, with anger associated.  Pt unhappy with current care providers and would like to change cardiologist and primary care MD.   Pt requested signature for physical therapy verification for the Pain [facility].  Pt advised we do not offer physical therapy services. I made phone call to pain clinic and verified pt was instructed to schedule physical therapy evaluation as part of his Pain clinic evaluation. Will facilitate scheduling the appt for pt.  Pt oriented to exercise equipment and routine.  Understanding verbalized. Marland Kitchen

## 2013-08-30 ENCOUNTER — Encounter (HOSPITAL_COMMUNITY): Payer: Medicaid Other

## 2013-09-02 ENCOUNTER — Encounter (HOSPITAL_COMMUNITY): Payer: Medicaid Other

## 2013-09-04 ENCOUNTER — Encounter (HOSPITAL_COMMUNITY): Payer: Medicaid Other

## 2013-09-06 ENCOUNTER — Encounter (HOSPITAL_COMMUNITY): Payer: Medicaid Other

## 2013-09-09 ENCOUNTER — Encounter (HOSPITAL_COMMUNITY): Payer: Medicaid Other

## 2013-09-09 ENCOUNTER — Telehealth (HOSPITAL_COMMUNITY): Payer: Self-pay | Admitting: Cardiac Rehabilitation

## 2013-09-09 NOTE — Telephone Encounter (Signed)
pc received from [facility].  She received order for patient, however when she contacted him to schedule he declined.  PC to pt to assess reason for continued absence from cardiac rehab and also assess reason for declining outpatient rehab. Left message on home answering machine

## 2013-09-11 ENCOUNTER — Telehealth (HOSPITAL_COMMUNITY): Payer: Self-pay | Admitting: Cardiac Rehabilitation

## 2013-09-11 ENCOUNTER — Encounter (HOSPITAL_COMMUNITY): Payer: Medicaid Other

## 2013-09-11 NOTE — Telephone Encounter (Signed)
pc to pt to assess reason for continued absence from cardiac rehab.  Lm for pt to return call

## 2013-09-16 ENCOUNTER — Encounter (HOSPITAL_COMMUNITY): Payer: Medicaid Other

## 2013-09-16 ENCOUNTER — Telehealth (HOSPITAL_COMMUNITY): Payer: Self-pay | Admitting: Cardiac Rehabilitation

## 2013-09-16 NOTE — Telephone Encounter (Signed)
pc to pt to assess reason for continued absence from cardiac rehab.  Person that answered the phone hung up on me.  Unable to leave message.  Will mail pt letter

## 2013-09-17 ENCOUNTER — Encounter (HOSPITAL_COMMUNITY): Payer: Self-pay | Admitting: Cardiac Rehabilitation

## 2013-09-18 ENCOUNTER — Encounter (HOSPITAL_COMMUNITY): Payer: Medicaid Other

## 2013-09-20 ENCOUNTER — Encounter (HOSPITAL_COMMUNITY): Payer: Medicaid Other

## 2013-09-23 ENCOUNTER — Encounter (HOSPITAL_COMMUNITY): Payer: Medicaid Other

## 2013-09-25 ENCOUNTER — Encounter (HOSPITAL_COMMUNITY): Payer: Medicaid Other

## 2013-09-27 ENCOUNTER — Encounter (HOSPITAL_COMMUNITY): Payer: Medicaid Other

## 2013-09-30 ENCOUNTER — Encounter (HOSPITAL_COMMUNITY): Payer: Medicaid Other

## 2013-10-02 ENCOUNTER — Encounter (HOSPITAL_COMMUNITY): Payer: Medicaid Other

## 2013-10-04 ENCOUNTER — Encounter (HOSPITAL_COMMUNITY): Payer: Medicaid Other

## 2013-10-07 ENCOUNTER — Encounter (HOSPITAL_COMMUNITY): Payer: Medicaid Other

## 2013-10-09 ENCOUNTER — Encounter (HOSPITAL_COMMUNITY): Payer: Medicaid Other

## 2013-10-14 ENCOUNTER — Encounter (HOSPITAL_COMMUNITY): Payer: Medicaid Other

## 2013-10-16 ENCOUNTER — Encounter (HOSPITAL_COMMUNITY): Payer: Medicaid Other

## 2013-10-18 ENCOUNTER — Encounter (HOSPITAL_COMMUNITY): Payer: Medicaid Other

## 2013-10-21 ENCOUNTER — Encounter (HOSPITAL_COMMUNITY): Payer: Medicaid Other

## 2013-10-23 ENCOUNTER — Encounter (HOSPITAL_COMMUNITY): Payer: Medicaid Other

## 2013-10-24 ENCOUNTER — Encounter (HOSPITAL_COMMUNITY): Payer: Self-pay | Admitting: *Deleted

## 2013-10-25 ENCOUNTER — Ambulatory Visit: Payer: Medicaid Other

## 2013-10-25 ENCOUNTER — Encounter (HOSPITAL_COMMUNITY): Payer: Medicaid Other

## 2013-10-28 ENCOUNTER — Encounter (HOSPITAL_COMMUNITY): Payer: Medicaid Other

## 2013-10-30 ENCOUNTER — Encounter (HOSPITAL_COMMUNITY): Payer: Medicaid Other

## 2013-11-01 ENCOUNTER — Encounter (HOSPITAL_COMMUNITY): Payer: Medicaid Other

## 2013-11-04 ENCOUNTER — Encounter (HOSPITAL_COMMUNITY): Payer: Medicaid Other

## 2013-11-06 ENCOUNTER — Encounter (HOSPITAL_COMMUNITY): Payer: Medicaid Other

## 2013-11-08 ENCOUNTER — Encounter (HOSPITAL_COMMUNITY): Payer: Medicaid Other

## 2013-11-10 ENCOUNTER — Emergency Department (HOSPITAL_COMMUNITY): Payer: No Typology Code available for payment source

## 2013-11-10 ENCOUNTER — Emergency Department (HOSPITAL_COMMUNITY)
Admission: EM | Admit: 2013-11-10 | Discharge: 2013-11-10 | Disposition: A | Discharge status: Home / Self Care | Payer: No Typology Code available for payment source | Attending: Emergency Medicine | Admitting: Emergency Medicine

## 2013-11-10 ENCOUNTER — Encounter (HOSPITAL_COMMUNITY): Payer: Self-pay | Admitting: Emergency Medicine

## 2013-11-10 DIAGNOSIS — S39012A Strain of muscle, fascia and tendon of lower back, initial encounter: Secondary | ICD-10-CM

## 2013-11-10 DIAGNOSIS — S161XXA Strain of muscle, fascia and tendon at neck level, initial encounter: Secondary | ICD-10-CM

## 2013-11-10 DIAGNOSIS — K219 Gastro-esophageal reflux disease without esophagitis: Secondary | ICD-10-CM | POA: Insufficient documentation

## 2013-11-10 DIAGNOSIS — I2510 Atherosclerotic heart disease of native coronary artery without angina pectoris: Secondary | ICD-10-CM | POA: Insufficient documentation

## 2013-11-10 DIAGNOSIS — Y9389 Activity, other specified: Secondary | ICD-10-CM | POA: Insufficient documentation

## 2013-11-10 DIAGNOSIS — M436 Torticollis: Secondary | ICD-10-CM | POA: Insufficient documentation

## 2013-11-10 DIAGNOSIS — Z79899 Other long term (current) drug therapy: Secondary | ICD-10-CM | POA: Insufficient documentation

## 2013-11-10 DIAGNOSIS — Z87891 Personal history of nicotine dependence: Secondary | ICD-10-CM | POA: Insufficient documentation

## 2013-11-10 DIAGNOSIS — Z7982 Long term (current) use of aspirin: Secondary | ICD-10-CM | POA: Insufficient documentation

## 2013-11-10 DIAGNOSIS — Y92410 Unspecified street and highway as the place of occurrence of the external cause: Secondary | ICD-10-CM | POA: Insufficient documentation

## 2013-11-10 DIAGNOSIS — S0990XA Unspecified injury of head, initial encounter: Secondary | ICD-10-CM | POA: Insufficient documentation

## 2013-11-10 DIAGNOSIS — IMO0002 Reserved for concepts with insufficient information to code with codable children: Secondary | ICD-10-CM | POA: Insufficient documentation

## 2013-11-10 DIAGNOSIS — I252 Old myocardial infarction: Secondary | ICD-10-CM | POA: Insufficient documentation

## 2013-11-10 DIAGNOSIS — S335XXA Sprain of ligaments of lumbar spine, initial encounter: Secondary | ICD-10-CM | POA: Insufficient documentation

## 2013-11-10 DIAGNOSIS — R079 Chest pain, unspecified: Secondary | ICD-10-CM | POA: Insufficient documentation

## 2013-11-10 DIAGNOSIS — S139XXA Sprain of joints and ligaments of unspecified parts of neck, initial encounter: Secondary | ICD-10-CM | POA: Insufficient documentation

## 2013-11-10 DIAGNOSIS — Z9861 Coronary angioplasty status: Secondary | ICD-10-CM | POA: Insufficient documentation

## 2013-11-10 MED ORDER — CYCLOBENZAPRINE HCL 10 MG PO TABS: 10.0000 mg | ORAL_TABLET | Freq: Two times a day (BID) | ORAL | Status: DC | PRN

## 2013-11-10 MED ORDER — ONDANSETRON HCL 4 MG/2ML IJ SOLN
4.0000 mg | Freq: Once | INTRAMUSCULAR | Status: AC
  Administered 2013-11-10: 4 mg via INTRAVENOUS
  Filled 2013-11-10: qty 2

## 2013-11-10 MED ORDER — OXYCODONE-ACETAMINOPHEN 5-325 MG PO TABS: 1.0000 | ORAL_TABLET | Freq: Four times a day (QID) | ORAL | Status: DC | PRN

## 2013-11-10 MED ORDER — MORPHINE SULFATE 4 MG/ML IJ SOLN
4.0000 mg | Freq: Once | INTRAMUSCULAR | Status: AC
  Administered 2013-11-10: 4 mg via INTRAVENOUS
  Filled 2013-11-10: qty 1
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
## 2013-11-10 NOTE — ED Notes (Signed)
Pt transported to CT and x-ray  

## 2013-11-10 NOTE — Discharge Instructions (Signed)
Cervical Sprain A cervical sprain is an injury in the neck in which the strong, fibrous tissues (ligaments) that connect your neck bones stretch or tear. Cervical sprains can range from mild to severe. Severe cervical sprains can cause the neck vertebrae to be unstable. This can lead to damage of the spinal cord and can result in serious nervous system problems. The amount of time it takes for a cervical sprain to get better depends on the cause and extent of the injury. Most cervical sprains heal in 1 to 3 weeks. CAUSES  Severe cervical sprains may be caused by:   Contact sport injuries (such as from football, rugby, wrestling, hockey, auto racing, gymnastics, diving, martial arts, or boxing).   Motor vehicle collisions.   Whiplash injuries. This is an injury from a sudden forward-and backward whipping movement of the head and neck.  Falls.  Mild cervical sprains may be caused by:   Being in an awkward position, such as while cradling a telephone between your ear and shoulder.   Sitting in a chair that does not offer proper support.   Working at a poorly designed computer station.   Looking up or down for long periods of time.  SYMPTOMS   Pain, soreness, stiffness, or a burning sensation in the front, back, or sides of the neck. This discomfort may develop immediately after the injury or slowly, 24 hours or more after the injury.   Pain or tenderness directly in the middle of the back of the neck.   Shoulder or upper back pain.   Limited ability to move the neck.   Headache.   Dizziness.   Weakness, numbness, or tingling in the hands or arms.   Muscle spasms.   Difficulty swallowing or chewing.   Tenderness and swelling of the neck.  DIAGNOSIS  Most of the time your health care provider can diagnose a cervical sprain by taking your history and doing a physical exam. Your health care provider will ask about previous neck injuries and any known neck  problems, such as arthritis in the neck. X-rays may be taken to find out if there are any other problems, such as with the bones of the neck. Other tests, such as a CT scan or MRI, may also be needed.  TREATMENT  Treatment depends on the severity of the cervical sprain. Mild sprains can be treated with rest, keeping the neck in place (immobilization), and pain medicines. Severe cervical sprains are immediately immobilized. Further treatment is done to help with pain, muscle spasms, and other symptoms and may include:  Medicines, such as pain relievers, numbing medicines, or muscle relaxants.   Physical therapy. This may involve stretching exercises, strengthening exercises, and posture training. Exercises and improved posture can help stabilize the neck, strengthen muscles, and help stop symptoms from returning.  HOME CARE INSTRUCTIONS   Put ice on the injured area.   Put ice in a plastic bag.   Place a towel between your skin and the bag.   Leave the ice on for 15 20 minutes, 3 4 times a day.   If your injury was severe, you may have been given a cervical collar to wear. A cervical collar is a two-piece collar designed to keep your neck from moving while it heals.  Do not remove the collar unless instructed by your health care provider.  If you have long hair, keep it outside of the collar.  Ask your health care provider before making any adjustments to your collar.   Minor adjustments may be required over time to improve comfort and reduce pressure on your chin or on the back of your head.  Ifyou are allowed to remove the collar for cleaning or bathing, follow your health care provider's instructions on how to do so safely.  Keep your collar clean by wiping it with mild soap and water and drying it completely. If the collar you have been given includes removable pads, remove them every 1 2 days and hand wash them with soap and water. Allow them to air dry. They should be completely  dry before you wear them in the collar.  If you are allowed to remove the collar for cleaning and bathing, wash and dry the skin of your neck. Check your skin for irritation or sores. If you see any, tell your health care provider.  Do not drive while wearing the collar.   Only take over-the-counter or prescription medicines for pain, discomfort, or fever as directed by your health care provider.   Keep all follow-up appointments as directed by your health care provider.   Keep all physical therapy appointments as directed by your health care provider.   Make any needed adjustments to your workstation to promote good posture.   Avoid positions and activities that make your symptoms worse.   Warm up and stretch before being active to help prevent problems.  SEEK MEDICAL CARE IF:   Your pain is not controlled with medicine.   You are unable to decrease your pain medicine over time as planned.   Your activity level is not improving as expected.  SEEK IMMEDIATE MEDICAL CARE IF:   You develop any bleeding.  You develop stomach upset.  You have signs of an allergic reaction to your medicine.   Your symptoms get worse.   You develop new, unexplained symptoms.   You have numbness, tingling, weakness, or paralysis in any part of your body.  MAKE SURE YOU:   Understand these instructions.  Will watch your condition.  Will get help right away if you are not doing well or get worse. Document Released: 07/31/2007 Document Revised: 07/24/2013 Document Reviewed: 04/10/2013 [employer] Patient Information 2014 [employer].  
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
## 2013-11-11 ENCOUNTER — Encounter (HOSPITAL_COMMUNITY): Payer: Medicaid Other

## 2013-11-13 ENCOUNTER — Encounter (HOSPITAL_COMMUNITY): Payer: Medicaid Other

## 2013-11-15 ENCOUNTER — Encounter (HOSPITAL_COMMUNITY): Payer: Medicaid Other

## 2013-11-16 ENCOUNTER — Encounter (HOSPITAL_COMMUNITY): Payer: Self-pay | Admitting: Emergency Medicine

## 2013-11-16 ENCOUNTER — Emergency Department (INDEPENDENT_AMBULATORY_CARE_PROVIDER_SITE_OTHER)
Admission: EM | Admit: 2013-11-16 | Discharge: 2013-11-16 | Disposition: A | Discharge status: Home / Self Care | Payer: Medicaid Other | Source: Home / Self Care | Attending: Family Medicine | Admitting: Family Medicine

## 2013-11-16 DIAGNOSIS — S161XXA Strain of muscle, fascia and tendon at neck level, initial encounter: Secondary | ICD-10-CM

## 2013-11-16 DIAGNOSIS — S39012A Strain of muscle, fascia and tendon of lower back, initial encounter: Secondary | ICD-10-CM

## 2013-11-16 DIAGNOSIS — S335XXA Sprain of ligaments of lumbar spine, initial encounter: Secondary | ICD-10-CM

## 2013-11-16 DIAGNOSIS — S139XXA Sprain of joints and ligaments of unspecified parts of neck, initial encounter: Secondary | ICD-10-CM

## 2013-11-16 MED ORDER — IBUPROFEN 800 MG PO TABS
800.0000 mg | ORAL_TABLET | Freq: Once | ORAL | Status: AC
  Administered 2013-11-16: 800 mg via ORAL

## 2013-11-16 MED ORDER — IBUPROFEN 800 MG PO TABS
ORAL_TABLET | ORAL | Status: AC
  Filled 2013-11-16: qty 1

## 2013-11-16 MED ORDER — IBUPROFEN 800 MG PO TABS: 800.0000 mg | ORAL_TABLET | Freq: Three times a day (TID) | ORAL | Status: DC | PRN

## 2013-11-16 MED ORDER — TRAMADOL HCL 50 MG PO TABS: 50.0000 mg | ORAL_TABLET | Freq: Four times a day (QID) | ORAL | Status: DC | PRN

## 2013-11-16 NOTE — ED Notes (Signed)
Plan of  Care  Discussed  With  Pt     Who verbalizes  knowledge
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
## 2013-11-18 NOTE — ED Provider Notes (Signed)
Medical screening examination/treatment/procedure(s) were performed by a resident physician or non-physician practitioner and as the supervising physician I was immediately available for consultation/collaboration.  Clementeen Graham, MD    Rodolph Bong, MD 11/18/13 [phone]

## 2013-11-20 ENCOUNTER — Emergency Department (HOSPITAL_COMMUNITY): Payer: No Typology Code available for payment source

## 2013-11-20 ENCOUNTER — Encounter (HOSPITAL_COMMUNITY): Payer: Self-pay | Admitting: Emergency Medicine

## 2013-11-20 ENCOUNTER — Emergency Department (HOSPITAL_COMMUNITY)
Admission: EM | Admit: 2013-11-20 | Discharge: 2013-11-20 | Disposition: A | Discharge status: Home / Self Care | Payer: No Typology Code available for payment source | Attending: Emergency Medicine | Admitting: Emergency Medicine

## 2013-11-20 DIAGNOSIS — Z79899 Other long term (current) drug therapy: Secondary | ICD-10-CM | POA: Insufficient documentation

## 2013-11-20 DIAGNOSIS — R0602 Shortness of breath: Secondary | ICD-10-CM

## 2013-11-20 DIAGNOSIS — M549 Dorsalgia, unspecified: Secondary | ICD-10-CM | POA: Insufficient documentation

## 2013-11-20 DIAGNOSIS — M542 Cervicalgia: Secondary | ICD-10-CM | POA: Insufficient documentation

## 2013-11-20 DIAGNOSIS — R109 Unspecified abdominal pain: Secondary | ICD-10-CM

## 2013-11-20 DIAGNOSIS — Z9861 Coronary angioplasty status: Secondary | ICD-10-CM | POA: Insufficient documentation

## 2013-11-20 DIAGNOSIS — K219 Gastro-esophageal reflux disease without esophagitis: Secondary | ICD-10-CM | POA: Insufficient documentation

## 2013-11-20 DIAGNOSIS — R059 Cough, unspecified: Secondary | ICD-10-CM | POA: Insufficient documentation

## 2013-11-20 DIAGNOSIS — R0789 Other chest pain: Secondary | ICD-10-CM | POA: Insufficient documentation

## 2013-11-20 DIAGNOSIS — Z7982 Long term (current) use of aspirin: Secondary | ICD-10-CM | POA: Insufficient documentation

## 2013-11-20 DIAGNOSIS — R05 Cough: Secondary | ICD-10-CM | POA: Insufficient documentation

## 2013-11-20 DIAGNOSIS — Z87891 Personal history of nicotine dependence: Secondary | ICD-10-CM | POA: Insufficient documentation

## 2013-11-20 DIAGNOSIS — I059 Rheumatic mitral valve disease, unspecified: Secondary | ICD-10-CM | POA: Insufficient documentation

## 2013-11-20 DIAGNOSIS — R079 Chest pain, unspecified: Secondary | ICD-10-CM

## 2013-11-20 DIAGNOSIS — I2510 Atherosclerotic heart disease of native coronary artery without angina pectoris: Secondary | ICD-10-CM | POA: Insufficient documentation

## 2013-11-20 LAB — PRO B NATRIURETIC PEPTIDE: Pro B Natriuretic peptide (BNP): 1011 pg/mL — ABNORMAL HIGH (ref 0–125)

## 2013-11-20 LAB — COMPREHENSIVE METABOLIC PANEL
ALBUMIN: 3.7 g/dL (ref 3.5–5.2)
ALT: 24 U/L (ref 0–53)
AST: 27 U/L (ref 0–37)
Alkaline Phosphatase: 68 U/L (ref 39–117)
BUN: 16 mg/dL (ref 6–23)
CO2: 20 mEq/L (ref 19–32)
CREATININE: 0.87 mg/dL (ref 0.50–1.35)
Calcium: 9.2 mg/dL (ref 8.4–10.5)
Chloride: 101 mEq/L (ref 96–112)
GFR calc Af Amer: >90 mL/min (ref >90)
GFR calc non Af Amer: >90 mL/min (ref >90)
Glucose, Bld: 132 mg/dL — ABNORMAL HIGH (ref 70–99)
Potassium: 4 mEq/L (ref 3.7–5.3)
SODIUM: 136 meq/L — AB (ref 137–147)
Total Bilirubin: 0.3 mg/dL (ref 0.3–1.2)
Total Protein: 7.6 g/dL (ref 6.0–8.3)

## 2013-11-20 LAB — CBC
HCT: 41.9 % (ref 39.0–52.0)
Hemoglobin: 14.8 g/dL (ref 13.0–17.0)
MCH: 32.6 pg (ref 26.0–34.0)
MCHC: 35.3 g/dL (ref 30.0–36.0)
MCV: 92.3 fL (ref 78.0–100.0)
Platelets: 164 10*3/uL (ref 150–400)
RBC: 4.54 MIL/uL (ref 4.22–5.81)
RDW: 13.9 % (ref 11.5–15.5)
WBC: 6.5 10*3/uL (ref 4.0–10.5)

## 2013-11-20 LAB — POCT I-STAT TROPONIN I: Troponin i, poc: 0.02 ng/mL (ref 0.00–0.08)

## 2013-11-20 MED ORDER — IOHEXOL 300 MG/ML  SOLN
25.0000 mL | INTRAMUSCULAR | Status: AC
  Administered 2013-11-20: 25 mL via ORAL

## 2013-11-20 MED ORDER — ALBUTEROL SULFATE HFA 108 (90 BASE) MCG/ACT IN AERS: 1.0000 | INHALATION_SPRAY | Freq: Four times a day (QID) | RESPIRATORY_TRACT | Status: DC | PRN

## 2013-11-20 MED ORDER — IBUPROFEN 200 MG PO TABS
400.0000 mg | ORAL_TABLET | Freq: Once | ORAL | Status: AC
  Administered 2013-11-20: 400 mg via ORAL
  Filled 2013-11-20: qty 2

## 2013-11-20 MED ORDER — SODIUM CHLORIDE 0.9 % IV BOLUS (SEPSIS)
250.0000 mL | Freq: Once | INTRAVENOUS | Status: AC
  Administered 2013-11-20: 250 mL via INTRAVENOUS

## 2013-11-20 MED ORDER — IOHEXOL 350 MG/ML SOLN
100.0000 mL | Freq: Once | INTRAVENOUS | Status: AC | PRN
  Administered 2013-11-20: 100 mL via INTRAVENOUS

## 2013-11-20 MED ORDER — HYDROCODONE-ACETAMINOPHEN 5-325 MG PO TABS: 1.0000 | ORAL_TABLET | Freq: Four times a day (QID) | ORAL | Status: DC | PRN

## 2013-11-20 MED ORDER — SODIUM CHLORIDE 0.9 % IV SOLN
INTRAVENOUS | Status: DC
Start: 2013-11-20 — End: 2013-11-20
  Administered 2013-11-20: 14:00:00 via INTRAVENOUS

## 2013-11-20 MED ORDER — ONDANSETRON HCL 4 MG/2ML IJ SOLN
4.0000 mg | Freq: Once | INTRAMUSCULAR | Status: AC
  Administered 2013-11-20: 4 mg via INTRAVENOUS
  Filled 2013-11-20: qty 2
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
## 2013-11-20 NOTE — ED Notes (Signed)
Pt.place back on moitor.

## 2013-11-20 NOTE — ED Notes (Signed)
Dr. Zackowski at bedside  
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
## 2013-11-20 NOTE — ED Notes (Signed)
Pt reports left sided sharp intermittent chest pain since last Friday. Reports shortness of breath with exertion. Also reports rectal bleeding last night. States was in [facility] on Sunday, checked out then.

## 2013-11-20 NOTE — Discharge Instructions (Signed)
Suspect based on your work up being negative its most likely chest wall pain is your main complaint. Use albuterol inhaler 2 puffs every 6 hours for the next week or 2 at least. Take pain medicine as needed. Make an appointment to followup with your regular Dr.

## 2013-11-20 NOTE — ED Notes (Addendum)
Dr. Zackowski at bedside  

## 2013-11-22 ENCOUNTER — Telehealth: Payer: Self-pay | Admitting: Cardiovascular Disease

## 2013-11-22 NOTE — Telephone Encounter (Signed)
Son calls stating pt has been having chest pain, sob.  Son states pt thinks one of his stents is closing up.    States his father is not available at this time as he went to get his medicine. States he does not know where he is or if he has taken his NTG   Pt has a  new pt for Dr. Clifton James as appt was already scheduled for next week. I have called pt mobile number as given to me by son & received voicemail x2  Have told son that if pt symptoms persist he should go to the ED for further evaluation.  Mylo Red RN

## 2013-11-22 NOTE — Telephone Encounter (Signed)
New problem   Pt is having chest pain, sob and pain going down arm.

## 2013-11-22 NOTE — Telephone Encounter (Signed)
lmtcb Debbie Mete Purdum RN  

## 2013-11-29 ENCOUNTER — Encounter: Payer: Self-pay | Admitting: Cardiovascular Disease

## 2013-11-29 ENCOUNTER — Ambulatory Visit (INDEPENDENT_AMBULATORY_CARE_PROVIDER_SITE_OTHER): Payer: Medicaid Other | Admitting: Cardiovascular Disease

## 2013-11-29 VITALS — BP 132/87 | HR 77 | Ht 67.0 in | Wt 196.4 lb

## 2013-11-29 DIAGNOSIS — Z87891 Personal history of nicotine dependence: Secondary | ICD-10-CM

## 2013-11-29 DIAGNOSIS — K219 Gastro-esophageal reflux disease without esophagitis: Secondary | ICD-10-CM

## 2013-11-29 DIAGNOSIS — I2510 Atherosclerotic heart disease of native coronary artery without angina pectoris: Secondary | ICD-10-CM | POA: Insufficient documentation

## 2013-11-29 DIAGNOSIS — I2589 Other forms of chronic ischemic heart disease: Secondary | ICD-10-CM

## 2013-11-29 DIAGNOSIS — F17201 Nicotine dependence, unspecified, in remission: Secondary | ICD-10-CM | POA: Insufficient documentation

## 2013-11-29 DIAGNOSIS — I10 Essential (primary) hypertension: Secondary | ICD-10-CM

## 2013-11-29 DIAGNOSIS — I255 Ischemic cardiomyopathy: Secondary | ICD-10-CM | POA: Insufficient documentation

## 2013-11-29 MED ORDER — ROSUVASTATIN CALCIUM 20 MG PO TABS: 20.0000 mg | ORAL_TABLET | Freq: Every day | ORAL | Status: DC

## 2013-11-29 MED ORDER — CLOPIDOGREL BISULFATE 75 MG PO TABS: 75.0000 mg | ORAL_TABLET | Freq: Every day | ORAL | Status: DC

## 2013-11-29 MED ORDER — PANTOPRAZOLE SODIUM 40 MG PO TBEC: 40.0000 mg | DELAYED_RELEASE_TABLET | Freq: Every day | ORAL | Status: DC

## 2013-11-29 MED ORDER — NITROGLYCERIN 0.4 MG SL SUBL: 0.4000 mg | SUBLINGUAL_TABLET | SUBLINGUAL | Status: DC | PRN

## 2013-11-29 MED ORDER — CARVEDILOL 3.125 MG PO TABS
3.1250 mg | ORAL_TABLET | Freq: Two times a day (BID) | ORAL | Status: DC
Start: 2013-11-29 — End: 2014-04-23

## 2013-11-29 MED ORDER — SPIRONOLACTONE 25 MG PO TABS: 25.0000 mg | ORAL_TABLET | Freq: Every day | ORAL | Status: DC

## 2013-11-29 MED ORDER — RAMIPRIL 1.25 MG PO CAPS: 1.2500 mg | ORAL_CAPSULE | Freq: Every day | ORAL | Status: DC

## 2013-11-29 NOTE — Telephone Encounter (Signed)
Pt saw Dr. McAlhany today 

## 2013-11-29 NOTE — Patient Instructions (Signed)
Your physician recommends that you schedule a follow-up appointment in:  About 6 weeks.   Your physician has requested that you have a lexiscan myoview. For further information please visit [email or web address] Please follow instruction sheet, as given.   Your physician has recommended you make the following change in your medication:  Stop Brilinta.  Start Clopidogrel 75 mg by mouth daily. Stop Nexium.  Start Protonix 40 mg by mouth daily.  Continue all other medications as listed on your medication list.
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
## 2013-12-13 ENCOUNTER — Ambulatory Visit: Payer: Medicaid Other | Attending: Internal Medicine | Admitting: Internal Medicine

## 2013-12-13 VITALS — BP 113/78 | HR 64 | Temp 98.1°F | Resp 16 | Ht 67.0 in | Wt 198.0 lb

## 2013-12-13 DIAGNOSIS — I10 Essential (primary) hypertension: Secondary | ICD-10-CM | POA: Insufficient documentation

## 2013-12-13 DIAGNOSIS — I2510 Atherosclerotic heart disease of native coronary artery without angina pectoris: Secondary | ICD-10-CM | POA: Insufficient documentation

## 2013-12-13 DIAGNOSIS — F411 Generalized anxiety disorder: Secondary | ICD-10-CM | POA: Insufficient documentation

## 2013-12-13 DIAGNOSIS — I252 Old myocardial infarction: Secondary | ICD-10-CM | POA: Insufficient documentation

## 2013-12-13 DIAGNOSIS — Z0000 Encounter for general adult medical examination without abnormal findings: Secondary | ICD-10-CM

## 2013-12-13 DIAGNOSIS — Z8249 Family history of ischemic heart disease and other diseases of the circulatory system: Secondary | ICD-10-CM | POA: Insufficient documentation

## 2013-12-13 DIAGNOSIS — R059 Cough, unspecified: Secondary | ICD-10-CM | POA: Insufficient documentation

## 2013-12-13 DIAGNOSIS — Z7902 Long term (current) use of antithrombotics/antiplatelets: Secondary | ICD-10-CM | POA: Insufficient documentation

## 2013-12-13 DIAGNOSIS — K117 Disturbances of salivary secretion: Secondary | ICD-10-CM

## 2013-12-13 DIAGNOSIS — R05 Cough: Secondary | ICD-10-CM | POA: Insufficient documentation

## 2013-12-13 DIAGNOSIS — I428 Other cardiomyopathies: Secondary | ICD-10-CM | POA: Insufficient documentation

## 2013-12-13 DIAGNOSIS — Z9861 Coronary angioplasty status: Secondary | ICD-10-CM | POA: Insufficient documentation

## 2013-12-13 DIAGNOSIS — K219 Gastro-esophageal reflux disease without esophagitis: Secondary | ICD-10-CM | POA: Insufficient documentation

## 2013-12-13 DIAGNOSIS — Z7982 Long term (current) use of aspirin: Secondary | ICD-10-CM | POA: Insufficient documentation

## 2013-12-13 DIAGNOSIS — Z87891 Personal history of nicotine dependence: Secondary | ICD-10-CM | POA: Insufficient documentation

## 2013-12-13 DIAGNOSIS — R682 Dry mouth, unspecified: Secondary | ICD-10-CM

## 2013-12-13 LAB — COMPLETE METABOLIC PANEL WITH GFR
ALT: 16 U/L (ref 0–53)
AST: 19 U/L (ref 0–37)
Albumin: 3.9 g/dL (ref 3.5–5.2)
Alkaline Phosphatase: 58 U/L (ref 39–117)
BILIRUBIN TOTAL: 0.4 mg/dL (ref 0.2–1.2)
BUN: 15 mg/dL (ref 6–23)
CO2: 25 meq/L (ref 19–32)
CREATININE: 1 mg/dL (ref 0.50–1.35)
Calcium: 9 mg/dL (ref 8.4–10.5)
Chloride: 103 mEq/L (ref 96–112)
GFR, EST NON AFRICAN AMERICAN: 81 mL/min
GLUCOSE: 99 mg/dL (ref 70–99)
Potassium: 4.3 mEq/L (ref 3.5–5.3)
Sodium: 136 mEq/L (ref 135–145)
Total Protein: 6.9 g/dL (ref 6.0–8.3)

## 2013-12-13 MED ORDER — HYDROCOD POLST-CHLORPHEN POLST 10-8 MG/5ML PO LQCR: 5.0000 mL | Freq: Two times a day (BID) | ORAL | Status: DC | PRN

## 2013-12-13 MED ORDER — BIOTENE ORALBALANCE DRY MOUTH MT LIQD: 5.0000 mL | Freq: Four times a day (QID) | OROMUCOSAL | Status: DC | PRN

## 2013-12-13 MED ORDER — AZITHROMYCIN 250 MG PO TABS: ORAL_TABLET | ORAL | Status: DC

## 2013-12-13 NOTE — Progress Notes (Signed)
Pt is here to establish care. ED referral Heart attack.
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
## 2013-12-17 ENCOUNTER — Ambulatory Visit (HOSPITAL_COMMUNITY): Payer: Medicaid Other | Attending: Cardiovascular Disease | Admitting: Radiology

## 2013-12-17 VITALS — BP 129/77 | HR 60 | Ht 67.0 in | Wt 197.0 lb

## 2013-12-17 DIAGNOSIS — I252 Old myocardial infarction: Secondary | ICD-10-CM | POA: Insufficient documentation

## 2013-12-17 DIAGNOSIS — R079 Chest pain, unspecified: Secondary | ICD-10-CM | POA: Insufficient documentation

## 2013-12-17 DIAGNOSIS — I10 Essential (primary) hypertension: Secondary | ICD-10-CM | POA: Insufficient documentation

## 2013-12-17 DIAGNOSIS — Z8249 Family history of ischemic heart disease and other diseases of the circulatory system: Secondary | ICD-10-CM | POA: Insufficient documentation

## 2013-12-17 DIAGNOSIS — I4510 Unspecified right bundle-branch block: Secondary | ICD-10-CM | POA: Insufficient documentation

## 2013-12-17 DIAGNOSIS — E785 Hyperlipidemia, unspecified: Secondary | ICD-10-CM | POA: Insufficient documentation

## 2013-12-17 DIAGNOSIS — Z87891 Personal history of nicotine dependence: Secondary | ICD-10-CM | POA: Insufficient documentation

## 2013-12-17 DIAGNOSIS — I2510 Atherosclerotic heart disease of native coronary artery without angina pectoris: Secondary | ICD-10-CM | POA: Insufficient documentation

## 2013-12-17 MED ORDER — REGADENOSON 0.4 MG/5ML IV SOLN
0.4000 mg | Freq: Once | INTRAVENOUS | Status: AC
  Administered 2013-12-17: 0.4 mg via INTRAVENOUS

## 2013-12-17 MED ORDER — TECHNETIUM TC 99M SESTAMIBI GENERIC - CARDIOLITE
33.0000 | Freq: Once | INTRAVENOUS | Status: AC | PRN
  Administered 2013-12-17: 33 via INTRAVENOUS

## 2013-12-17 MED ORDER — TECHNETIUM TC 99M SESTAMIBI GENERIC - CARDIOLITE
11.0000 | Freq: Once | INTRAVENOUS | Status: AC | PRN
  Administered 2013-12-17: 11 via INTRAVENOUS

## 2013-12-17 NOTE — Progress Notes (Signed)
[facility] SITE 3 NUCLEAR MED [address] [place], [place] [address] [phone]    Cardiology Nuclear Med Study  Peter Pu. is a 63 y.o. male     MRN : [id number]     DOB: May 26, 1951  Procedure Date: 12/17/2013  Nuclear Med Background Indication for Stress Test:  Evaluation for Ischemia and Post Hospital: ER with Chest Pain and (-) markers History:  CAD, MI, Cath, Stent LAD X2, Echo 2014 EF 30-35%, MPI 2014 (normal) EF 54% Cardiac Risk Factors: Family History - CAD, History of Smoking, Hypertension, Lipids and RBBB  Symptoms:  Chest Pain and Chest Pain with Exertion    Nuclear Pre-Procedure Caffeine/Decaff Intake:  7:00pm NPO After: 7:00pm   Lungs:  clear O2 Sat: 97% on room air. IV 0.9% NS with Angio Cath:  20g  IV Site: R Hand  IV Started by:  Cathlyn Parsons, RN  Chest Size (in):  44 Cup Size: n/a  Height: 5\' 7"  (1.702 m)  Weight:  197 lb (89.359 kg)  BMI:  Body mass index is 30.85 kg/(m^2). Tech Comments:  Coreg taken at 0600 today    Nuclear Med Study 1 or 2 day study: 1 day  Stress Test Type:  Lexiscan  Reading MD: n/a  Order Authorizing Provider:  Tedra Senegal  Resting Radionuclide: Technetium 42m Sestamibi  Resting Radionuclide Dose: 11.0 mCi   Stress Radionuclide:  Technetium 80m Sestamibi  Stress Radionuclide Dose: 33.0 mCi           Stress Protocol Rest HR: 60 Stress HR: 76  Rest BP: 129/77 Stress BP: 143/88  Exercise Time (min): n/a METS: n/a           Dose of Adenosine (mg):  n/a Dose of Lexiscan: 0.4 mg  Dose of Atropine (mg): n/a Dose of Dobutamine: n/a mcg/kg/min (at max HR)  Stress Test Technologist: Nelson Chimes, BS-ES  Nuclear Technologist:  Domenic Polite, CNMT     Rest Procedure:  Myocardial perfusion imaging was performed at rest 45 minutes following the intravenous administration of Technetium 22m Sestamibi. Rest ECG: NSR with RBBB, anterolateral infarct  Stress Procedure:  The patient received IV  Lexiscan 0.4 mg over 15-seconds.  Technetium 43m Sestamibi injected at 30-seconds.  Quantitative spect images were obtained after a 45 minute delay. During the infusion of Lexiscan, the patient complained of feeling SOB, nauseated and heart pounding.  Symptoms resolved in recovery.  Stress ECG: No significant change from baseline ECG  QPS Raw Data Images:  Mild diaphragmatic attenuation.  Normal left ventricular size. Stress Images:  There is a large fixed defect involving the mid and distal anterior and anteroseptal regions, mid and distal inferior walls and entire apex consistent with prior infarct. Rest Images:  Comparison with the stress images reveals no significant change. Subtraction (SDS):  There is a fixed defect that is most consistent with a previous infarction. Transient Ischemic Dilatation (Normal <1.22):  1.04 Lung/Heart Ratio (Normal <0.45):  0.41  Quantitative Gated Spect Images QGS EDV:  241 ml QGS ESV:  177 ml  Impression Exercise Capacity:  Lexiscan with no exercise. BP Response:  Normal blood pressure response. Clinical Symptoms:  There is dyspnea. ECG Impression:  No significant ECG changes with Lexiscan. Comparison with Prior Nuclear Study: No images to compare  Overall Impression:  Intermediate risk stress nuclear study with large area of scar involving the mid and distal anterior, inferior and apical segments.  There is no evidence of ischemia.   LV Ejection  Fraction: 27%.  LV Wall Motion:  severe LV dysfunction with apical dyskinesis  Signed: Armanda Magicraci Turner, MD 12/17/2013

## 2013-12-18 ENCOUNTER — Telehealth: Payer: Self-pay | Admitting: Cardiovascular Disease

## 2013-12-18 NOTE — Telephone Encounter (Signed)
Spoke with pt and reviewed stress test results with him.  Also told him Plavix and Protonix should not be contributing to his dry mouth and he should continue these medications.

## 2013-12-18 NOTE — Telephone Encounter (Signed)
New problem   Pt returning your call concerning test results. Please call pt.

## 2013-12-19 ENCOUNTER — Telehealth (HOSPITAL_COMMUNITY): Payer: Self-pay

## 2013-12-19 NOTE — ED Notes (Signed)
Paper completed and signed by Dr Bebe Shaggy for pain management center.

## 2014-01-14 ENCOUNTER — Encounter: Payer: Self-pay | Admitting: Cardiovascular Disease

## 2014-01-14 ENCOUNTER — Ambulatory Visit (INDEPENDENT_AMBULATORY_CARE_PROVIDER_SITE_OTHER): Payer: Medicaid Other | Admitting: Cardiovascular Disease

## 2014-01-14 VITALS — BP 124/66 | HR 71 | Ht 67.0 in | Wt 199.0 lb

## 2014-01-14 DIAGNOSIS — Z87891 Personal history of nicotine dependence: Secondary | ICD-10-CM

## 2014-01-14 DIAGNOSIS — I255 Ischemic cardiomyopathy: Secondary | ICD-10-CM

## 2014-01-14 DIAGNOSIS — I10 Essential (primary) hypertension: Secondary | ICD-10-CM

## 2014-01-14 DIAGNOSIS — I2510 Atherosclerotic heart disease of native coronary artery without angina pectoris: Secondary | ICD-10-CM

## 2014-01-14 DIAGNOSIS — F17201 Nicotine dependence, unspecified, in remission: Secondary | ICD-10-CM

## 2014-01-14 DIAGNOSIS — K219 Gastro-esophageal reflux disease without esophagitis: Secondary | ICD-10-CM

## 2014-01-14 DIAGNOSIS — I2589 Other forms of chronic ischemic heart disease: Secondary | ICD-10-CM

## 2014-01-14 NOTE — Patient Instructions (Signed)
Your physician recommends that you schedule a follow-up appointment in:  4 months.   Your physician has requested that you have an echocardiogram. Echocardiography is a painless test that uses sound waves to create images of your heart. It provides your doctor with information about the size and shape of your heart and how well your heart's chambers and valves are working. This procedure takes approximately one hour. There are no restrictions for this procedure. To be done in 3 months.
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
## 2014-01-16 ENCOUNTER — Telehealth: Payer: Self-pay | Admitting: Cardiovascular Disease

## 2014-01-16 NOTE — Telephone Encounter (Signed)
New message    Patient would like a new prescription advair to be called.    Patient stating he's having a hard time sleeping at night asking for something to be called in as well.  PCP was not contacted.   [facility] pharmacy.

## 2014-01-16 NOTE — Telephone Encounter (Signed)
Pt is aware to call his PCP for her breathing problems and lack sleep medication. Pt verbalized understanding.

## 2014-02-18 ENCOUNTER — Ambulatory Visit: Payer: Medicaid Other | Attending: Internal Medicine | Admitting: Internal Medicine

## 2014-02-18 VITALS — BP 122/77 | HR 66 | Temp 97.8°F | Resp 12

## 2014-02-18 DIAGNOSIS — I252 Old myocardial infarction: Secondary | ICD-10-CM | POA: Insufficient documentation

## 2014-02-18 DIAGNOSIS — R110 Nausea: Secondary | ICD-10-CM | POA: Insufficient documentation

## 2014-02-18 DIAGNOSIS — Z79899 Other long term (current) drug therapy: Secondary | ICD-10-CM | POA: Insufficient documentation

## 2014-02-18 DIAGNOSIS — M129 Arthropathy, unspecified: Secondary | ICD-10-CM | POA: Insufficient documentation

## 2014-02-18 DIAGNOSIS — I2510 Atherosclerotic heart disease of native coronary artery without angina pectoris: Secondary | ICD-10-CM | POA: Insufficient documentation

## 2014-02-18 DIAGNOSIS — Z87891 Personal history of nicotine dependence: Secondary | ICD-10-CM | POA: Insufficient documentation

## 2014-02-18 DIAGNOSIS — Z760 Encounter for issue of repeat prescription: Secondary | ICD-10-CM | POA: Insufficient documentation

## 2014-02-18 DIAGNOSIS — Z7982 Long term (current) use of aspirin: Secondary | ICD-10-CM | POA: Insufficient documentation

## 2014-02-18 DIAGNOSIS — I10 Essential (primary) hypertension: Secondary | ICD-10-CM | POA: Insufficient documentation

## 2014-02-18 DIAGNOSIS — K219 Gastro-esophageal reflux disease without esophagitis: Secondary | ICD-10-CM | POA: Insufficient documentation

## 2014-02-18 DIAGNOSIS — Z9861 Coronary angioplasty status: Secondary | ICD-10-CM | POA: Insufficient documentation

## 2014-02-18 DIAGNOSIS — M542 Cervicalgia: Secondary | ICD-10-CM

## 2014-02-18 MED ORDER — ALBUTEROL SULFATE HFA 108 (90 BASE) MCG/ACT IN AERS: 2.0000 | INHALATION_SPRAY | Freq: Four times a day (QID) | RESPIRATORY_TRACT | Status: DC | PRN

## 2014-02-18 MED ORDER — ONDANSETRON HCL 8 MG PO TABS: 8.0000 mg | ORAL_TABLET | Freq: Three times a day (TID) | ORAL | Status: DC | PRN

## 2014-02-18 MED ORDER — TRAMADOL HCL 50 MG PO TABS: 50.0000 mg | ORAL_TABLET | Freq: Three times a day (TID) | ORAL | Status: DC | PRN

## 2014-02-18 NOTE — Progress Notes (Signed)
Patient here today for arthritis pain. Patient states pain 9 overall, but a 10 in his neck. Patient has tried heat rub with no relief.

## 2014-02-18 NOTE — Progress Notes (Signed)
MRN: [id number] Name: Peter GullingRodney D Guia Sr.  Sex: male Age: 63 y.o. DOB: 08/24/1951  Allergies: Review of patient's allergies indicates no known allergies.  Chief Complaint  Patient presents with  . Arthritis Pain    HPI: Patient is 63 y.o. male who comes today as a walk-in patient, requesting refill on the medication as per patient he used to be on albuterol and needs the medication refill because sometimes he get short of breath, also requesting prescription for Zofran which he has taking in the past and has with the nausea also reported to have lot of arthritic pain especially in the neck, he was prescribed Vicodin by a different physician and requesting prescription for that, I have explained to patient that  we don't prescribe narcotic medication in our office.  Past Medical History  Diagnosis Date  . Coronary artery disease     DES proximal LAD  . GERD (gastroesophageal reflux disease)   . MI (mitral incompetence)     anterolateral MI October 2014  . HTN (hypertension)     Past Surgical History  Procedure Laterality Date  . Coronary stent placement  2000, 2014  . Bilateral shoulder arthroscopy        Medication List       This list is accurate as of: 02/18/14 11:23 AM.  Always use your most recent med list.               albuterol 108 (90 BASE) MCG/ACT inhaler  Commonly known as:  PROVENTIL HFA;VENTOLIN HFA  Inhale 2 puffs into the lungs every 6 (six) hours as needed for wheezing or shortness of breath.     aspirin EC 81 MG tablet  Take 81 mg by mouth daily.     carvedilol 3.125 MG tablet  Commonly known as:  COREG  Take 1 tablet (3.125 mg total) by mouth 2 (two) times daily with a meal.     clopidogrel 75 MG tablet  Commonly known as:  PLAVIX  Take 1 tablet (75 mg total) by mouth daily.     nitroGLYCERIN 0.4 MG SL tablet  Commonly known as:  NITROSTAT  Place 1 tablet (0.4 mg total) under the tongue every 5 (five) minutes as needed for chest pain.     ondansetron 8 MG tablet  Commonly known as:  ZOFRAN  Take 1 tablet (8 mg total) by mouth every 8 (eight) hours as needed for nausea or vomiting.     pantoprazole 40 MG tablet  Commonly known as:  PROTONIX  Take 1 tablet (40 mg total) by mouth daily.     ramipril 1.25 MG capsule  Commonly known as:  ALTACE  Take 1 capsule (1.25 mg total) by mouth daily.     rosuvastatin 20 MG tablet  Commonly known as:  CRESTOR  Take 1 tablet (20 mg total) by mouth daily.     spironolactone 25 MG tablet  Commonly known as:  ALDACTONE  Take 1 tablet (25 mg total) by mouth daily.     traMADol 50 MG tablet  Commonly known as:  ULTRAM  Take 1 tablet (50 mg total) by mouth every 8 (eight) hours as needed for moderate pain.        Meds ordered this encounter  Medications  . albuterol (PROVENTIL HFA;VENTOLIN HFA) 108 (90 BASE) MCG/ACT inhaler    Sig: Inhale 2 puffs into the lungs every 6 (six) hours as needed for wheezing or shortness of breath.    Dispense:  1  Inhaler    Refill:  2  . traMADol (ULTRAM) 50 MG tablet    Sig: Take 1 tablet (50 mg total) by mouth every 8 (eight) hours as needed for moderate pain.    Dispense:  30 tablet    Refill:  0  . ondansetron (ZOFRAN) 8 MG tablet    Sig: Take 1 tablet (8 mg total) by mouth every 8 (eight) hours as needed for nausea or vomiting.    Dispense:  20 tablet    Refill:  0     There is no immunization history on file for this patient.  Family History  Problem Relation Age of Onset  . CAD Father   . Heart attack Brother 44    x 3  . Heart attack Father 46    History  Substance Use Topics  . Smoking status: Former Smoker -- 0.50 packs/day for 54 years    Types: Cigarettes    Quit date: 07/28/2013  . Smokeless tobacco: Not on file  . Alcohol Use: 7.0 oz/week    14 drink(s) per week    Review of Systems   As noted in HPI  Filed Vitals:   02/18/14 1040  BP: 122/77  Pulse: 66  Temp: 97.8 F (36.6 C)  Resp: 12    Physical  Exam  Physical Exam  Constitutional: No distress.  Eyes: EOM are normal. Pupils are equal, round, and reactive to light.  Cardiovascular: Normal rate and regular rhythm.   Pulmonary/Chest: Breath sounds normal. No respiratory distress. He has no wheezes. He has no rales.    CBC    Component Value Date/Time   WBC 6.5 11/20/2013 1159   RBC 4.54 11/20/2013 1159   HGB 14.8 11/20/2013 1159   HCT 41.9 11/20/2013 1159   PLT 164 11/20/2013 1159   MCV 92.3 11/20/2013 1159   LYMPHSABS 1.2 07/29/2013 2055   MONOABS 0.6 07/29/2013 2055   EOSABS 0.1 07/29/2013 2055   BASOSABS 0.0 07/29/2013 2055    CMP     Component Value Date/Time   NA 136 12/13/2013 1115   K 4.3 12/13/2013 1115   CL 103 12/13/2013 1115   CO2 25 12/13/2013 1115   GLUCOSE 99 12/13/2013 1115   BUN 15 12/13/2013 1115   CREATININE 1.00 12/13/2013 1115   CREATININE 0.87 11/20/2013 1159   CALCIUM 9.0 12/13/2013 1115   PROT 6.9 12/13/2013 1115   ALBUMIN 3.9 12/13/2013 1115   AST 19 12/13/2013 1115   ALT 16 12/13/2013 1115   ALKPHOS 58 12/13/2013 1115   BILITOT 0.4 12/13/2013 1115   GFRNONAA 81 12/13/2013 1115   GFRNONAA >90 11/20/2013 1159   GFRAA >89 12/13/2013 1115   GFRAA >90 11/20/2013 1159    Lab Results  Component Value Date/Time   CHOL 170 07/30/2013  2:25 AM    No components found with this basename: hga1c    Lab Results  Component Value Date/Time   AST 19 12/13/2013 11:15 AM    Assessment and Plan  Medication refill - Plan: albuterol (PROVENTIL HFA;VENTOLIN HFA) 108 (90 BASE) MCG/ACT inhaler  Neck pain - Plan: traMADol (ULTRAM) 50 MG tablet  Nausea alone - Plan: ondansetron (ZOFRAN) 8 MG tablet  Patient will follow up as scheduled.  Doris Cheadle, MD

## 2014-02-19 ENCOUNTER — Emergency Department (HOSPITAL_COMMUNITY)
Admission: EM | Admit: 2014-02-19 | Discharge: 2014-02-19 | Disposition: A | Discharge status: Home / Self Care | Payer: Medicaid Other | Attending: Emergency Medicine | Admitting: Emergency Medicine

## 2014-02-19 ENCOUNTER — Encounter (HOSPITAL_COMMUNITY): Payer: Self-pay | Admitting: Emergency Medicine

## 2014-02-19 DIAGNOSIS — Z9861 Coronary angioplasty status: Secondary | ICD-10-CM | POA: Insufficient documentation

## 2014-02-19 DIAGNOSIS — Z87891 Personal history of nicotine dependence: Secondary | ICD-10-CM | POA: Insufficient documentation

## 2014-02-19 DIAGNOSIS — I252 Old myocardial infarction: Secondary | ICD-10-CM | POA: Insufficient documentation

## 2014-02-19 DIAGNOSIS — Z79899 Other long term (current) drug therapy: Secondary | ICD-10-CM | POA: Insufficient documentation

## 2014-02-19 DIAGNOSIS — M25476 Effusion, unspecified foot: Secondary | ICD-10-CM | POA: Insufficient documentation

## 2014-02-19 DIAGNOSIS — Z7902 Long term (current) use of antithrombotics/antiplatelets: Secondary | ICD-10-CM | POA: Insufficient documentation

## 2014-02-19 DIAGNOSIS — I2510 Atherosclerotic heart disease of native coronary artery without angina pectoris: Secondary | ICD-10-CM | POA: Insufficient documentation

## 2014-02-19 DIAGNOSIS — M79676 Pain in unspecified toe(s): Secondary | ICD-10-CM

## 2014-02-19 DIAGNOSIS — M25473 Effusion, unspecified ankle: Secondary | ICD-10-CM | POA: Insufficient documentation

## 2014-02-19 DIAGNOSIS — K219 Gastro-esophageal reflux disease without esophagitis: Secondary | ICD-10-CM | POA: Insufficient documentation

## 2014-02-19 DIAGNOSIS — Z7982 Long term (current) use of aspirin: Secondary | ICD-10-CM | POA: Insufficient documentation

## 2014-02-19 DIAGNOSIS — I10 Essential (primary) hypertension: Secondary | ICD-10-CM | POA: Insufficient documentation

## 2014-02-19 DIAGNOSIS — M79609 Pain in unspecified limb: Secondary | ICD-10-CM | POA: Insufficient documentation

## 2014-02-19 LAB — URIC ACID: Uric Acid, Serum: 6.7 mg/dL (ref 4.0–7.8)

## 2014-02-19 MED ORDER — KETOROLAC TROMETHAMINE 60 MG/2ML IM SOLN
30.0000 mg | Freq: Once | INTRAMUSCULAR | Status: AC
  Administered 2014-02-19: 30 mg via INTRAMUSCULAR
  Filled 2014-02-19: qty 2

## 2014-02-19 MED ORDER — COLCHICINE 0.6 MG PO TABS: ORAL_TABLET | ORAL | Status: DC

## 2014-02-19 MED ORDER — OXYCODONE-ACETAMINOPHEN 5-325 MG PO TABS
2.0000 | ORAL_TABLET | Freq: Once | ORAL | Status: AC
  Administered 2014-02-19: 2 via ORAL
  Filled 2014-02-19: qty 2

## 2014-02-19 MED ORDER — OXYCODONE-ACETAMINOPHEN 5-325 MG PO TABS: ORAL_TABLET | ORAL | Status: DC

## 2014-02-19 MED ORDER — HYDROCODONE-ACETAMINOPHEN 5-325 MG PO TABS: ORAL_TABLET | ORAL | Status: DC

## 2014-02-19 NOTE — ED Notes (Signed)
Pt. Stated, No injury I just started having severe pain at my big toe and just right below it.  At the big toe joint red and swollen.

## 2014-02-19 NOTE — ED Provider Notes (Signed)
CSN: [id number]     Arrival date & time 02/19/14  1826 History   First MD Initiated Contact with Patient 02/19/14 1841   This chart was scribed [id number] non-physician practitioner Wynetta EmeryNicole Malekai Markwood, PA-C working with Merrie RoofJohn David Wofford III, * by Valera CastleSteven Perry, ED scribe. This patient was seen in room TR04C/TR04C and the patient's care was started at 6:51 PM.   Chief Complaint  Patient presents with  . Toe Pain   (Consider location/radiation/quality/duration/timing/severity/associated sxs/prior Treatment) The history is provided by the patient. No language interpreter was used.   HPI Comments: Peter GullingRodney D Bowdish Sr. is a 63 y.o. male who presents to the Emergency Department complaining of severe, right foot and right toe pain with associated swelling, onset today. He states he woke up this morning with only toe pain, states it then progressed to foot pain as well. He denies any recent injuries to his right foot, toes. He reports he has been taking Percocet for his h/o arthritis, and states he has been taking pain medication for 3 years. He denies any other associated symptoms. He denies h/o DM, but reports h/o heart trouble. He reports an allergy to Penicillin.   PCP - Jeanann LewandowskyJEGEDE, OLUGBEMIGA, MD  Past Medical History  Diagnosis Date  . Coronary artery disease     DES proximal LAD  . GERD (gastroesophageal reflux disease)   . MI (mitral incompetence)     anterolateral MI October 2014  . HTN (hypertension)    Past Surgical History  Procedure Laterality Date  . Coronary stent placement  2000, 2014  . Bilateral shoulder arthroscopy     Family History  Problem Relation Age of Onset  . CAD Father   . Heart attack Brother 44    x 3  . Heart attack Father 2246   History  Substance Use Topics  . Smoking status: Former Smoker -- 0.50 packs/day for 54 years    Types: Cigarettes    Quit date: 07/28/2013  . Smokeless tobacco: Not on file  . Alcohol Use: 7.0 oz/week    14 drink(s) per week   Review  of Systems  Constitutional: Negative for fever.  Musculoskeletal: Positive for arthralgias (right foot, toe) and joint swelling (right foot, toe).  Skin: Negative for wound.  Neurological: Negative for numbness.   Allergies  Review of patient's allergies indicates no known allergies.  Home Medications   Prior to Admission medications   Medication Sig Start Date End Date Taking? Authorizing Provider  albuterol (PROVENTIL HFA;VENTOLIN HFA) 108 (90 BASE) MCG/ACT inhaler Inhale 2 puffs into the lungs every 6 (six) hours as needed for wheezing or shortness of breath. 02/18/14   Doris Cheadleeepak Advani, MD  aspirin EC 81 MG tablet Take 81 mg by mouth daily.    Historical Provider, MD  carvedilol (COREG) 3.125 MG tablet Take 1 tablet (3.125 mg total) by mouth 2 (two) times daily with a meal. 11/29/13   Kathleene Hazelhristopher D McAlhany, MD  clopidogrel (PLAVIX) 75 MG tablet Take 1 tablet (75 mg total) by mouth daily. 11/29/13   Kathleene Hazelhristopher D McAlhany, MD  nitroGLYCERIN (NITROSTAT) 0.4 MG SL tablet Place 1 tablet (0.4 mg total) under the tongue every 5 (five) minutes as needed for chest pain. 11/29/13   Kathleene Hazelhristopher D McAlhany, MD  ondansetron (ZOFRAN) 8 MG tablet Take 1 tablet (8 mg total) by mouth every 8 (eight) hours as needed for nausea or vomiting. 02/18/14   Doris Cheadleeepak Advani, MD  pantoprazole (PROTONIX) 40 MG tablet Take 1 tablet (40 mg total)  by mouth daily. 11/29/13   Kathleene Hazel, MD  ramipril (ALTACE) 1.25 MG capsule Take 1 capsule (1.25 mg total) by mouth daily. 11/29/13   Kathleene Hazel, MD  rosuvastatin (CRESTOR) 20 MG tablet Take 1 tablet (20 mg total) by mouth daily. 11/29/13   Kathleene Hazel, MD  spironolactone (ALDACTONE) 25 MG tablet Take 1 tablet (25 mg total) by mouth daily. 11/29/13   Kathleene Hazel, MD  traMADol (ULTRAM) 50 MG tablet Take 1 tablet (50 mg total) by mouth every 8 (eight) hours as needed for moderate pain. 02/18/14   Doris Cheadle, MD   BP 125/78  Pulse 73   Temp(Src) 98.9 F (37.2 C) (Oral)  Resp 20  Wt 196 lb (88.905 kg)  SpO2 93%  Physical Exam  Nursing note and vitals reviewed. Constitutional: He is oriented to person, place, and time. He appears well-developed and well-nourished. No distress.  HENT:  Head: Normocephalic and atraumatic.  Mouth/Throat: Oropharynx is clear and moist.  Eyes: EOM are normal. Pupils are equal, round, and reactive to light.  Neck: Neck supple.  Cardiovascular: Normal rate.   Pulmonary/Chest: Effort normal and breath sounds normal. No respiratory distress.  Abdominal: Soft. Bowel sounds are normal.  Musculoskeletal: Normal range of motion.  Right great toe slightly erythematous and warm, reduced range of motion and exquisitely diffusely tender to palpation.  Neurological: He is alert and oriented to person, place, and time.  Skin: Skin is warm and dry.  Psychiatric: He has a normal mood and affect. His behavior is normal.   ED Course  Procedures (including critical care time)  DIAGNOSTIC STUDIES: Oxygen Saturation is 93% on room air, normal by my interpretation.    COORDINATION OF CARE: 6:55 PM-Discussed clinical suspicion of gout with pt. Discussed treatment plan with pt at bedside and pt agreed to plan. Pt requests having blood test performed. Will refer pt to specialist.   Results for orders placed during the hospital encounter of 02/19/14  URIC ACID      Result Value Ref Range   Uric Acid, Serum 6.7  4.0 - 7.8 mg/dL     EKG Interpretation None     Medications - No data to display MDM   Final diagnoses:  None    Filed Vitals:   02/19/14 1835 02/19/14 1957  BP: 125/78 119/78  Pulse: 73 76  Temp: 98.9 F (37.2 C) 97.9 F (36.6 C)  TempSrc: Oral Oral  Resp: 20 18  Weight: 196 lb (88.905 kg)   SpO2: 93% 96%    Medications  ketorolac (TORADOL) injection 30 mg (30 mg Intramuscular Given 02/19/14 1926)  oxyCODONE-acetaminophen (PERCOCET/ROXICET) 5-325 MG per tablet 2 tablet (2  tablets Oral Given 02/19/14 1926)    Peter Booker Sr. is a 63 y.o. male presenting with severe right great toe pain. Patient has no prior history of gout however physical exam is consistent with that. Uric acid drawn at patient request as he wants a definitive diagnosis. I explained to him that a negative uric acid does not rule out gout. Patient will be given Toradol and Percocet. Started on culture seen in Percocet to DC home with. Advised followup with orthopedist and primary care.  Evaluation does not show pathology that would require ongoing emergent intervention or inpatient treatment. Pt is hemodynamically stable and mentating appropriately. Discussed findings and plan with patient/guardian, who agrees with care plan. All questions answered. Return precautions discussed and outpatient follow up given.   Discharge Medication List as  of 02/19/2014  7:45 PM    START taking these medications   Details  colchicine 0.6 MG tablet Take 0.6mg  (one tablet) by mouth every 1-2 hours until one of the following occurs: 1.  The pain is gone 2.  The maximum dose has been given ( no more than 3 tabs in 3 hours or 10 tabs in 24 hours) 3.  The side effects outweight the benefits, Print    oxyCODONE-acetaminophen (PERCOCET/ROXICET) 5-325 MG per tablet 1 to 2 tabs PO q6hrs  PRN for pain, Print        Note: Portions of this report may have been transcribed using voice recognition software. Every effort was made to ensure accuracy; however, inadvertent computerized transcription errors may be present   I personally performed the services described in this documentation, which was scribed in my presence. The recorded information has been reviewed and is accurate.   Wynetta Emery, PA-C 02/20/14 1946

## 2014-02-19 NOTE — Discharge Instructions (Signed)
Take percocet for breakthrough pain, do not drink alcohol, drive, care for children or do other critical tasks while taking percocet.  Do not hesitate to return to the Emergency Department for any new, worsening or concerning symptoms.   If you do not have a primary care doctor you can establish one at the   [facility]: [address] [place] [address] [phone]  After you establish care. Let them know you were seen in the emergency room. They must obtain records for further management.

## 2014-02-19 NOTE — ED Notes (Signed)
Pt reports he woke up with swelling to his right toe and right foot. Is also sweating. Thinks it could be a spider bite but didn't see any spiders is just guessing because the house hasn't been spray in a while. Sensation intact

## 2014-02-21 NOTE — ED Provider Notes (Signed)
Medical screening examination/treatment/procedure(s) were performed by non-physician practitioner and as supervising physician I was immediately available for consultation/collaboration.   EKG Interpretation None        Candyce Churn III, MD 02/21/14 1226

## 2014-03-13 ENCOUNTER — Telehealth: Payer: Self-pay | Admitting: Internal Medicine

## 2014-03-13 ENCOUNTER — Ambulatory Visit (HOSPITAL_BASED_OUTPATIENT_CLINIC_OR_DEPARTMENT_OTHER): Payer: Medicaid Other | Admitting: Internal Medicine

## 2014-03-13 NOTE — Telephone Encounter (Signed)
Pt came in today for an appointment earlier than his scheduled appointment time; pt waited 15 min past his appointment time, was frustrated and walked out of the office; pt stated that the service at the ED was faster and that he wouldn't have to wait as long;

## 2014-03-31 ENCOUNTER — Other Ambulatory Visit (HOSPITAL_COMMUNITY): Payer: Medicaid Other

## 2014-04-01 ENCOUNTER — Encounter (HOSPITAL_COMMUNITY): Payer: Self-pay | Admitting: Cardiology

## 2014-04-23 ENCOUNTER — Encounter: Payer: Self-pay | Admitting: Cardiovascular Disease

## 2014-04-23 ENCOUNTER — Ambulatory Visit (INDEPENDENT_AMBULATORY_CARE_PROVIDER_SITE_OTHER): Payer: Medicaid Other | Admitting: Cardiovascular Disease

## 2014-04-23 ENCOUNTER — Encounter: Payer: Self-pay | Admitting: *Deleted

## 2014-04-23 ENCOUNTER — Encounter (HOSPITAL_COMMUNITY): Payer: Self-pay | Admitting: Pharmacy Technician

## 2014-04-23 ENCOUNTER — Ambulatory Visit (HOSPITAL_BASED_OUTPATIENT_CLINIC_OR_DEPARTMENT_OTHER): Payer: Medicaid Other | Admitting: Radiology

## 2014-04-23 VITALS — BP 122/72 | HR 60 | Ht 68.0 in | Wt 203.0 lb

## 2014-04-23 DIAGNOSIS — I25119 Atherosclerotic heart disease of native coronary artery with unspecified angina pectoris: Secondary | ICD-10-CM

## 2014-04-23 DIAGNOSIS — R072 Precordial pain: Secondary | ICD-10-CM

## 2014-04-23 DIAGNOSIS — R0602 Shortness of breath: Secondary | ICD-10-CM

## 2014-04-23 DIAGNOSIS — I10 Essential (primary) hypertension: Secondary | ICD-10-CM

## 2014-04-23 DIAGNOSIS — I25110 Atherosclerotic heart disease of native coronary artery with unstable angina pectoris: Secondary | ICD-10-CM

## 2014-04-23 DIAGNOSIS — I2510 Atherosclerotic heart disease of native coronary artery without angina pectoris: Secondary | ICD-10-CM

## 2014-04-23 DIAGNOSIS — Z760 Encounter for issue of repeat prescription: Secondary | ICD-10-CM

## 2014-04-23 DIAGNOSIS — I200 Unstable angina: Secondary | ICD-10-CM

## 2014-04-23 DIAGNOSIS — I2589 Other forms of chronic ischemic heart disease: Secondary | ICD-10-CM

## 2014-04-23 DIAGNOSIS — I255 Ischemic cardiomyopathy: Secondary | ICD-10-CM

## 2014-04-23 LAB — CBC
HCT: 42.6 % (ref 39.0–52.0)
HEMOGLOBIN: 14.4 g/dL (ref 13.0–17.0)
MCHC: 33.9 g/dL (ref 30.0–36.0)
MCV: 96 fl (ref 78.0–100.0)
PLATELETS: 196 10*3/uL (ref 150.0–400.0)
RBC: 4.44 Mil/uL (ref 4.22–5.81)
RDW: 14.9 % (ref 11.5–15.5)
WBC: 7.8 10*3/uL (ref 4.0–10.5)

## 2014-04-23 LAB — BASIC METABOLIC PANEL
BUN: 15 mg/dL (ref 6–23)
CO2: 24 mEq/L (ref 19–32)
Calcium: 8.7 mg/dL (ref 8.4–10.5)
Chloride: 106 mEq/L (ref 96–112)
Creatinine, Ser: 1 mg/dL (ref 0.4–1.5)
GFR: 85.54 mL/min (ref >60.00)
Glucose, Bld: 106 mg/dL — ABNORMAL HIGH (ref 70–99)
POTASSIUM: 4.2 meq/L (ref 3.5–5.1)
SODIUM: 137 meq/L (ref 135–145)

## 2014-04-23 LAB — PROTIME-INR
INR: 1 ratio (ref 0.8–1.0)
Prothrombin Time: 10.6 s (ref 9.6–13.1)

## 2014-04-23 MED ORDER — ALBUTEROL SULFATE HFA 108 (90 BASE) MCG/ACT IN AERS: 2.0000 | INHALATION_SPRAY | Freq: Four times a day (QID) | RESPIRATORY_TRACT | Status: DC | PRN

## 2014-04-23 NOTE — Patient Instructions (Signed)
Your physician has requested that you have a cardiac catheterization. Cardiac catheterization is used to diagnose and/or treat various heart conditions. Doctors may recommend this procedure for a number of different reasons. The most common reason is to evaluate chest pain. Chest pain can be a symptom of coronary artery disease (CAD), and cardiac catheterization can show whether plaque is narrowing or blocking your heart's arteries. This procedure is also used to evaluate the valves, as well as measure the blood flow and oxygen levels in different parts of your heart. For further information please visit [email or web address] Please follow instruction sheet, as given. Scheduled for April 24, 2014.   Keep scheduled appt with Dr. Clifton James on May 16, 2014.
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
## 2014-04-23 NOTE — Progress Notes (Signed)
Echocardiogram performed. Patient complained of chest throbbing and lungs burning when entering the exam room.  Patient requested to be seen a physician.  Spoke with Dr. Clifton James concerning patient.  Dr. Clifton James came to the exam room to speak with patient and agreed to see patient following echocardiogram.

## 2014-04-24 ENCOUNTER — Encounter (HOSPITAL_COMMUNITY)
Admission: RE | Disposition: A | Discharge status: Home / Self Care | Payer: Self-pay | Source: Ambulatory Visit | Attending: Cardiovascular Disease

## 2014-04-24 ENCOUNTER — Other Ambulatory Visit: Payer: Self-pay | Admitting: *Deleted

## 2014-04-24 ENCOUNTER — Encounter (HOSPITAL_COMMUNITY): Payer: Medicaid Other

## 2014-04-24 ENCOUNTER — Encounter (HOSPITAL_COMMUNITY): Payer: Self-pay | Admitting: General Practice

## 2014-04-24 ENCOUNTER — Inpatient Hospital Stay (HOSPITAL_COMMUNITY)
Admission: RE | Admit: 2014-04-24 | Discharge: 2014-05-06 | DRG: 234 | Disposition: A | Discharge status: Home / Self Care | Payer: Medicaid Other | Source: Ambulatory Visit | Attending: Surgery | Admitting: Surgery

## 2014-04-24 ENCOUNTER — Other Ambulatory Visit: Payer: Self-pay

## 2014-04-24 DIAGNOSIS — I4729 Other ventricular tachycardia: Secondary | ICD-10-CM | POA: Diagnosis not present

## 2014-04-24 DIAGNOSIS — Z87891 Personal history of nicotine dependence: Secondary | ICD-10-CM

## 2014-04-24 DIAGNOSIS — Z7902 Long term (current) use of antithrombotics/antiplatelets: Secondary | ICD-10-CM

## 2014-04-24 DIAGNOSIS — I255 Ischemic cardiomyopathy: Secondary | ICD-10-CM

## 2014-04-24 DIAGNOSIS — D696 Thrombocytopenia, unspecified: Secondary | ICD-10-CM | POA: Diagnosis not present

## 2014-04-24 DIAGNOSIS — M109 Gout, unspecified: Secondary | ICD-10-CM | POA: Diagnosis present

## 2014-04-24 DIAGNOSIS — I2510 Atherosclerotic heart disease of native coronary artery without angina pectoris: Secondary | ICD-10-CM

## 2014-04-24 DIAGNOSIS — I200 Unstable angina: Secondary | ICD-10-CM | POA: Diagnosis present

## 2014-04-24 DIAGNOSIS — Y849 Medical procedure, unspecified as the cause of abnormal reaction of the patient, or of later complication, without mention of misadventure at the time of the procedure: Secondary | ICD-10-CM | POA: Diagnosis present

## 2014-04-24 DIAGNOSIS — Z6832 Body mass index (BMI) 32.0-32.9, adult: Secondary | ICD-10-CM

## 2014-04-24 DIAGNOSIS — Z951 Presence of aortocoronary bypass graft: Secondary | ICD-10-CM

## 2014-04-24 DIAGNOSIS — I252 Old myocardial infarction: Secondary | ICD-10-CM

## 2014-04-24 DIAGNOSIS — D62 Acute posthemorrhagic anemia: Secondary | ICD-10-CM | POA: Diagnosis not present

## 2014-04-24 DIAGNOSIS — F411 Generalized anxiety disorder: Secondary | ICD-10-CM | POA: Diagnosis present

## 2014-04-24 DIAGNOSIS — I472 Ventricular tachycardia, unspecified: Secondary | ICD-10-CM | POA: Diagnosis not present

## 2014-04-24 DIAGNOSIS — E119 Type 2 diabetes mellitus without complications: Secondary | ICD-10-CM | POA: Diagnosis present

## 2014-04-24 DIAGNOSIS — T82897A Other specified complication of cardiac prosthetic devices, implants and grafts, initial encounter: Secondary | ICD-10-CM | POA: Diagnosis present

## 2014-04-24 DIAGNOSIS — F17201 Nicotine dependence, unspecified, in remission: Secondary | ICD-10-CM

## 2014-04-24 DIAGNOSIS — E785 Hyperlipidemia, unspecified: Secondary | ICD-10-CM | POA: Diagnosis present

## 2014-04-24 DIAGNOSIS — E8779 Other fluid overload: Secondary | ICD-10-CM

## 2014-04-24 DIAGNOSIS — R079 Chest pain, unspecified: Secondary | ICD-10-CM | POA: Diagnosis present

## 2014-04-24 DIAGNOSIS — Z7982 Long term (current) use of aspirin: Secondary | ICD-10-CM

## 2014-04-24 DIAGNOSIS — Z9189 Other specified personal risk factors, not elsewhere classified: Secondary | ICD-10-CM | POA: Diagnosis present

## 2014-04-24 DIAGNOSIS — I25110 Atherosclerotic heart disease of native coronary artery with unstable angina pectoris: Secondary | ICD-10-CM

## 2014-04-24 DIAGNOSIS — I10 Essential (primary) hypertension: Secondary | ICD-10-CM | POA: Diagnosis present

## 2014-04-24 DIAGNOSIS — Z79899 Other long term (current) drug therapy: Secondary | ICD-10-CM | POA: Diagnosis not present

## 2014-04-24 DIAGNOSIS — I2589 Other forms of chronic ischemic heart disease: Secondary | ICD-10-CM | POA: Diagnosis present

## 2014-04-24 DIAGNOSIS — K219 Gastro-esophageal reflux disease without esophagitis: Secondary | ICD-10-CM | POA: Diagnosis present

## 2014-04-24 DIAGNOSIS — Z01810 Encounter for preprocedural cardiovascular examination: Secondary | ICD-10-CM

## 2014-04-24 HISTORY — DX: Acute myocardial infarction, unspecified: I21.9

## 2014-04-24 HISTORY — DX: Gout, unspecified: M10.9

## 2014-04-24 HISTORY — PX: LEFT HEART CATHETERIZATION WITH CORONARY ANGIOGRAM: SHX5451

## 2014-04-24 HISTORY — DX: Unspecified osteoarthritis, unspecified site: M19.90

## 2014-04-24 LAB — CBC
HEMATOCRIT: 39.9 % (ref 39.0–52.0)
Hemoglobin: 13.4 g/dL (ref 13.0–17.0)
MCH: 31.8 pg (ref 26.0–34.0)
MCHC: 33.6 g/dL (ref 30.0–36.0)
MCV: 94.8 fL (ref 78.0–100.0)
Platelets: 173 10*3/uL (ref 150–400)
RBC: 4.21 MIL/uL — ABNORMAL LOW (ref 4.22–5.81)
RDW: 13.9 % (ref 11.5–15.5)
WBC: 7.2 10*3/uL (ref 4.0–10.5)

## 2014-04-24 LAB — POCT ACTIVATED CLOTTING TIME: Activated Clotting Time: 247 seconds

## 2014-04-24 LAB — HEPARIN LEVEL (UNFRACTIONATED): Heparin Unfractionated: <0.1 IU/mL — ABNORMAL LOW (ref 0.30–0.70)

## 2014-04-24 SURGERY — LEFT HEART CATHETERIZATION WITH CORONARY ANGIOGRAM
Anesthesia: LOCAL

## 2014-04-24 MED ORDER — HEPARIN (PORCINE) IN NACL 2-0.9 UNIT/ML-% IJ SOLN
INTRAMUSCULAR | Status: AC
  Filled 2014-04-24: qty 1000

## 2014-04-24 MED ORDER — SODIUM CHLORIDE 0.9 % IV SOLN
INTRAVENOUS | Status: DC
  Administered 2014-04-24: 07:00:00 via INTRAVENOUS

## 2014-04-24 MED ORDER — LIDOCAINE HCL (PF) 1 % IJ SOLN
INTRAMUSCULAR | Status: AC
  Filled 2014-04-24: qty 30

## 2014-04-24 MED ORDER — PANTOPRAZOLE SODIUM 40 MG PO TBEC
40.0000 mg | DELAYED_RELEASE_TABLET | Freq: Every day | ORAL | Status: DC
  Administered 2014-04-25 – 2014-04-30 (×6): 40 mg via ORAL
  Filled 2014-04-24 (×6): qty 1

## 2014-04-24 MED ORDER — HEPARIN SODIUM (PORCINE) 1000 UNIT/ML IJ SOLN
INTRAMUSCULAR | Status: AC
  Filled 2014-04-24: qty 1

## 2014-04-24 MED ORDER — ONDANSETRON HCL 4 MG/2ML IJ SOLN: 4.0000 mg | Freq: Four times a day (QID) | INTRAMUSCULAR | Status: DC | PRN

## 2014-04-24 MED ORDER — ALBUTEROL SULFATE (2.5 MG/3ML) 0.083% IN NEBU: 2.5000 mg | INHALATION_SOLUTION | Freq: Four times a day (QID) | RESPIRATORY_TRACT | Status: DC | PRN

## 2014-04-24 MED ORDER — ONDANSETRON HCL 4 MG PO TABS: 8.0000 mg | ORAL_TABLET | Freq: Three times a day (TID) | ORAL | Status: DC | PRN

## 2014-04-24 MED ORDER — COLCHICINE 0.6 MG PO TABS
0.6000 mg | ORAL_TABLET | Freq: Every day | ORAL | Status: DC | PRN
  Filled 2014-04-24: qty 1

## 2014-04-24 MED ORDER — VERAPAMIL HCL 2.5 MG/ML IV SOLN
INTRAVENOUS | Status: AC
  Filled 2014-04-24: qty 2

## 2014-04-24 MED ORDER — MIDAZOLAM HCL 2 MG/2ML IJ SOLN
INTRAMUSCULAR | Status: AC
  Filled 2014-04-24: qty 2

## 2014-04-24 MED ORDER — ASPIRIN 81 MG PO CHEW
81.0000 mg | CHEWABLE_TABLET | ORAL | Status: AC
  Administered 2014-04-24: 81 mg via ORAL

## 2014-04-24 MED ORDER — RAMIPRIL 1.25 MG PO CAPS
1.2500 mg | ORAL_CAPSULE | Freq: Every day | ORAL | Status: DC
  Administered 2014-04-25 – 2014-04-30 (×6): 1.25 mg via ORAL
  Filled 2014-04-24 (×7): qty 1

## 2014-04-24 MED ORDER — NITROGLYCERIN 0.4 MG SL SUBL: 0.4000 mg | SUBLINGUAL_TABLET | SUBLINGUAL | Status: DC | PRN

## 2014-04-24 MED ORDER — FUROSEMIDE 10 MG/ML IJ SOLN
40.0000 mg | Freq: Once | INTRAMUSCULAR | Status: AC
  Administered 2014-04-24: 40 mg via INTRAVENOUS
  Filled 2014-04-24: qty 4

## 2014-04-24 MED ORDER — SODIUM CHLORIDE 0.9 % IV SOLN
INTRAVENOUS | Status: AC
  Administered 2014-04-24: 75 mL/h via INTRAVENOUS

## 2014-04-24 MED ORDER — FENTANYL CITRATE 0.05 MG/ML IJ SOLN
INTRAMUSCULAR | Status: AC
  Filled 2014-04-24: qty 2

## 2014-04-24 MED ORDER — SODIUM CHLORIDE 0.9 % IV SOLN: 250.0000 mL | INTRAVENOUS | Status: DC | PRN

## 2014-04-24 MED ORDER — SODIUM CHLORIDE 0.9 % IJ SOLN
3.0000 mL | INTRAMUSCULAR | Status: DC | PRN
  Administered 2014-04-24: 3 mL via INTRAVENOUS

## 2014-04-24 MED ORDER — CARVEDILOL 3.125 MG PO TABS
3.1250 mg | ORAL_TABLET | Freq: Two times a day (BID) | ORAL | Status: DC
  Administered 2014-04-24 – 2014-04-30 (×13): 3.125 mg via ORAL
  Filled 2014-04-24 (×17): qty 1

## 2014-04-24 MED ORDER — ACETAMINOPHEN 325 MG PO TABS
650.0000 mg | ORAL_TABLET | ORAL | Status: DC | PRN
  Administered 2014-04-25 – 2014-04-30 (×9): 650 mg via ORAL
  Filled 2014-04-24 (×10): qty 2

## 2014-04-24 MED ORDER — ASPIRIN 81 MG PO CHEW
CHEWABLE_TABLET | ORAL | Status: AC
  Filled 2014-04-24: qty 1

## 2014-04-24 MED ORDER — SPIRONOLACTONE 25 MG PO TABS
25.0000 mg | ORAL_TABLET | Freq: Every day | ORAL | Status: DC
  Administered 2014-04-25 – 2014-04-30 (×6): 25 mg via ORAL
  Filled 2014-04-24 (×7): qty 1

## 2014-04-24 MED ORDER — SODIUM CHLORIDE 0.9 % IJ SOLN: 3.0000 mL | Freq: Two times a day (BID) | INTRAMUSCULAR | Status: DC

## 2014-04-24 MED ORDER — ASPIRIN EC 81 MG PO TBEC
81.0000 mg | DELAYED_RELEASE_TABLET | Freq: Every day | ORAL | Status: DC
  Administered 2014-04-25 – 2014-04-30 (×6): 81 mg via ORAL
  Filled 2014-04-24 (×7): qty 1

## 2014-04-24 MED ORDER — ATORVASTATIN CALCIUM 40 MG PO TABS
40.0000 mg | ORAL_TABLET | Freq: Every day | ORAL | Status: DC
  Administered 2014-04-25 – 2014-05-05 (×10): 40 mg via ORAL
  Filled 2014-04-24 (×12): qty 1

## 2014-04-24 MED ORDER — HEPARIN (PORCINE) IN NACL 100-0.45 UNIT/ML-% IJ SOLN
1350.0000 [IU]/h | INTRAMUSCULAR | Status: DC
  Administered 2014-04-24: 1050 [IU]/h via INTRAVENOUS
  Administered 2014-04-25 – 2014-04-30 (×4): 1350 [IU]/h via INTRAVENOUS
  Filled 2014-04-24 (×12): qty 250

## 2014-04-24 NOTE — Progress Notes (Signed)
Utilization review completed.  

## 2014-04-24 NOTE — Progress Notes (Signed)
Pre-op Cardiac Surgery  Carotid Findings:  1-39% ICA stenosis.  Vertebral artery flow is antegrade.   Upper Extremity Right Left  Brachial Pressures 117T 117T  Radial Waveforms T T  Ulnar Waveforms T T  Palmar Arch (Allen's Test) Doppler signal obliterates with radial compression and is WNL with ulnar compression Doppler signal obliterates with radial compression and is WNL with ulnar compression   Findings:      Lower  Extremity Right Left  Dorsalis Pedis    Anterior Tibial    Posterior Tibial    Ankle/Brachial Indices      Findings:  palpable

## 2014-04-24 NOTE — Progress Notes (Signed)
ANTICOAGULATION CONSULT NOTE - Initial Consult  Pharmacy Consult for Heparin  Indication: S/p LHC for possible CABG   No Known Allergies  Patient Measurements: Height: 5\' 7"  (170.2 cm) Weight: 203 lb (92.08 kg) IBW/kg (Calculated) : 66.1 Heparin Dosing Weight: 84 kg   Vital Signs: Temp: 97.7 F (36.5 C) (07/09 0911) Temp src: Oral (07/09 0911) BP: 131/77 mmHg (07/09 0911) Pulse Rate: 55 (07/09 0911)  Labs:  Recent Labs  04/23/14 1236  HGB 14.4  HCT 42.6  PLT 196.0  LABPROT 10.6  INR 1.0  CREATININE 1.0    Estimated Creatinine Clearance: 83.9 ml/min (by C-G formula based on Cr of 1).   Medical History: Past Medical History  Diagnosis Date  . Coronary artery disease     DES proximal LAD  . GERD (gastroesophageal reflux disease)   . MI (mitral incompetence)     anterolateral MI October 2014  . HTN (hypertension)   . Anginal pain   . Myocardial infarction   . Shortness of breath   . Arthritis     OSTEO  . Gout     Medications:  Prescriptions prior to admission  Medication Sig Dispense Refill  . aspirin EC 81 MG tablet Take 81 mg by mouth daily.      . carvedilol (COREG) 3.125 MG tablet Take 3.125 mg by mouth 2 (two) times daily with a meal.      . clopidogrel (PLAVIX) 75 MG tablet Take 75 mg by mouth daily.      . colchicine 0.6 MG tablet Take 0.6 mg by mouth daily as needed (for gout).      . nitroGLYCERIN (NITROSTAT) 0.4 MG SL tablet Place 0.4 mg under the tongue every 5 (five) minutes as needed for chest pain.      . pantoprazole (PROTONIX) 40 MG tablet Take 40 mg by mouth daily.      . ramipril (ALTACE) 1.25 MG capsule Take 1.25 mg by mouth daily.      . rosuvastatin (CRESTOR) 20 MG tablet Take 20 mg by mouth daily.      Marland Kitchen spironolactone (ALDACTONE) 25 MG tablet Take 25 mg by mouth daily.      Marland Kitchen albuterol (PROVENTIL HFA;VENTOLIN HFA) 108 (90 BASE) MCG/ACT inhaler Inhale 1-2 puffs into the lungs every 6 (six) hours as needed for wheezing or shortness of  breath.      . ondansetron (ZOFRAN) 8 MG tablet Take 8 mg by mouth every 8 (eight) hours as needed for nausea or vomiting.        Assessment: 37 YOM admitted with chest pain and SOB. S/p left heart cath today. Pharmacy consulted to start heparin drip 8 hours post sheath removal. H/H and plt wnl. No bleeding noted.    Goal of Therapy:  Heparin level 0.3-0.7 units/ml Monitor platelets by anticoagulation protocol: Yes   Plan:  1) Start heparin infusion at 1050 units/hr starting at 1700. No bolus 2) F/u 6-hr HL 3) Monitor daily HL, CBC, and s/s of bleeding 4) F/u plans for CABG   Vinnie Level, PharmD.  Clinical Pharmacist Pager [phone]
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
## 2014-04-24 NOTE — Progress Notes (Signed)
While getting patient ready for procedure around 0625, patient c/o 6/10 throbbing, "it hurts with every beat" chest pain.  Patient states that he has been having this pain for two weeks.  Patient refused nitroglycerin and chest pain protocol.

## 2014-04-24 NOTE — CV Procedure (Addendum)
Cardiac Catheterization Operative Report  Peter GullingRodney D Tolen Sr. [id number] 7/9/20158:33 AM Jeanann LewandowskyJEGEDE, OLUGBEMIGA, MD  Procedure Performed:  1. Left Heart Catheterization 2. Selective Coronary Angiography 3. Left ventricular angiogram 4. IVUS Left main  Operator: Verne Carrowhristopher McAlhany, MD  Arterial access site:  Right radial artery.   Indication: 63 yo male with history of  CAD, GERD, HTN, tobacco abuse, anxiety who is here today for cardiac cath. He has been followed in the past by Dr. Sharyn LullHarwani. [facility] with an acute anterolateral MI. Emergent cath per Dr. Sharyn LullHarwani with 100% occlusion proximal LAD treated with DES x 1. The Circumflex had moderate disease. The RCA was occluded proximally and filled from collaterals. Echo 07/30/13 with LVEF=30-35%. No significant valve issues. He was seen in the office yesterday and has had left sided chest pain/pressure for 2 weeks, worsened with exertion.                                      Procedure Details: The risks, benefits, complications, treatment options, and expected outcomes were discussed with the patient. The patient and/or family concurred with the proposed plan, giving informed consent. The patient was brought to the cath lab after IV hydration was begun and oral premedication was given. The patient was further sedated with Versed and Fentanyl. The right wrist was assessed with an Allens test which was positive. The right wrist was prepped and draped in a sterile fashion. 1% lidocaine was used for local anesthesia. Using the modified Seldinger access technique, a 5 French sheath was placed in the right radial artery. 3 mg Verapamil was given through the sheath. 5000 units IV heparin was given. Standard diagnostic catheters were used to perform selective coronary angiography. A pigtail catheter was used to perform a left ventricular angiogram. He was found to have moderate to severe disease in the left main with severe  ostial Circumflex disease. I elected to perform an IVUS of the left main. The patient was given an additional 5000 Units IV heparin x 1. ACT was 245. The left main was engaged with a XB LAD 3.5 guiding catheter. I then passed a Cougar IC wire down the LAD. The IVUS catheter was placed in the proximal LAD stented segment and automated pullback was performed from the proximal LAD back through the left main and into the guiding catheter. The wire and catheter were removed. The sheath was removed from the right radial artery and a Terumo hemostasis band was applied at the arteriotomy site on the right wrist.    There were no immediate complications. The patient was taken to the recovery area in stable condition.   Hemodynamic Findings: Central aortic pressure: 107/64 Left ventricular pressure: 114/22/30  IVUS Findings: The ostium of the LAD and Circumflex have severe disease. The distal left main is diffusely diseased with minimum lumen area of 5.0 mm2.   Angiographic Findings:  Left main: Distal 70% stenosis, hazy appearing in the caudal views.   Left Anterior Descending Artery: Large caliber vessel that courses to the apex. Ostial 40% stenosis. The entire proximal segment is stented with mild in-stent restenosis. The mid and distal vessel has mild diffuse plaque. The first diagonal branch is moderate in caliber with ostial 95% stenosis (jailed by the LAD stent).   Circumflex Artery: Large caliber vessel with ostial 80-90% stenosis. The first obtuse marginal branch is a large caliber  vessel with proximal 30% stenosis. The second obtuse marginal branch is a large caliber vessel with 30% mid stenosis. The AV groove Circumflex vessel has diffuse 40% stenosis.   Right Coronary Artery: Large dominant vessel with 100% occlusion (chronic). The mid and distal vessel fills from left to right collaterals.   Left Ventricular Angiogram: LVEF=25%, global hypokinesis with akinesis of the apex.   Impression: 1.  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  2. Left main stenosis confirmed by IVUS (5.0 mm2 minimum lumen area) 3. Severe ostial Circumflex stenosis 4. Severe ostial Diagonal stenosis 5. Chronically occluded RCA with good targets distally (filling by left to right collaterals).  6. Severe LV systolic dysfunction.   Recommendations: He has severe triple vessel CAD involving the left main with CTO of the RCA and other diffuse disease as described above. He continues to have lifestyle limiting symptoms consistent with unstable angina. He has been on ASA and Plavix since his PCI of the LAD in October 2014. Will admit to telemetry unit and ask CT surgery to see for CABG. He has good distal targets for bypass. Will hold Plavix today and start heparin drip 8 hours post sheath pull today.        Complications:  None. The patient tolerated the procedure well.

## 2014-04-24 NOTE — Interval H&P Note (Signed)
History and Physical Interval Note:  04/24/2014 7:27 AM  Peter Gulling Sr.  has presented today for cardiac cath with the diagnosis of chest pain/CAD.  The various methods of treatment have been discussed with the patient and family. After consideration of risks, benefits and other options for treatment, the patient has consented to  Procedure(s): LEFT HEART CATHETERIZATION WITH CORONARY ANGIOGRAM (N/A) as a surgical intervention .  The patient's history has been reviewed, patient examined, no change in status, stable for surgery.  I have reviewed the patient's chart and labs.  Questions were answered to the patient's satisfaction.    Cath Lab Visit (complete for each Cath Lab visit)  Clinical Evaluation Leading to the Procedure:   ACS: No.  Non-ACS:    Anginal Classification: CCS III  Anti-ischemic medical therapy: Minimal Therapy (1 class of medications)  Non-Invasive Test Results: No non-invasive testing performed  Prior CABG: No previous CABG         MCALHANY,CHRISTOPHER
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
## 2014-04-25 ENCOUNTER — Inpatient Hospital Stay (HOSPITAL_COMMUNITY): Payer: Medicaid Other

## 2014-04-25 DIAGNOSIS — I2589 Other forms of chronic ischemic heart disease: Secondary | ICD-10-CM

## 2014-04-25 DIAGNOSIS — I2510 Atherosclerotic heart disease of native coronary artery without angina pectoris: Secondary | ICD-10-CM

## 2014-04-25 DIAGNOSIS — I200 Unstable angina: Secondary | ICD-10-CM

## 2014-04-25 DIAGNOSIS — Z87891 Personal history of nicotine dependence: Secondary | ICD-10-CM

## 2014-04-25 DIAGNOSIS — I10 Essential (primary) hypertension: Secondary | ICD-10-CM

## 2014-04-25 LAB — CBC
HEMATOCRIT: 41.8 % (ref 39.0–52.0)
Hemoglobin: 14.2 g/dL (ref 13.0–17.0)
MCH: 32.1 pg (ref 26.0–34.0)
MCHC: 34 g/dL (ref 30.0–36.0)
MCV: 94.6 fL (ref 78.0–100.0)
PLATELETS: 184 10*3/uL (ref 150–400)
RBC: 4.42 MIL/uL (ref 4.22–5.81)
RDW: 13.9 % (ref 11.5–15.5)
WBC: 8.3 10*3/uL (ref 4.0–10.5)

## 2014-04-25 LAB — PULMONARY FUNCTION TEST
DL/VA % pred: 72 %
DL/VA: 3.09 ml/min/mmHg/L
DLCO COR: 20.28 ml/min/mmHg
DLCO UNC % PRED: 78 %
DLCO UNC: 20.04 ml/min/mmHg
DLCO cor % pred: 79 %
FEF 25-75 PRE: 1.39 L/s
FEF 25-75 Post: 2.08 L/sec
FEF2575-%Change-Post: 49 %
FEF2575-%Pred-Post: 84 %
FEF2575-%Pred-Pre: 56 %
FEV1-%Change-Post: 6 %
FEV1-%PRED-PRE: 78 %
FEV1-%Pred-Post: 83 %
FEV1-POST: 2.46 L
FEV1-Pre: 2.32 L
FEV1FVC-%Change-Post: -1 %
FEV1FVC-%Pred-Pre: 97 %
FEV6-%CHANGE-POST: 13 %
FEV6-%PRED-POST: 92 %
FEV6-%PRED-PRE: 80 %
FEV6-PRE: 3 L
FEV6-Post: 3.42 L
FEV6FVC-%Change-Post: 5 %
FEV6FVC-%PRED-POST: 105 %
FEV6FVC-%Pred-Pre: 99 %
FVC-%CHANGE-POST: 7 %
FVC-%PRED-POST: 87 %
FVC-%Pred-Pre: 81 %
FVC-PRE: 3.17 L
FVC-Post: 3.42 L
POST FEV6/FVC RATIO: 100 %
PRE FEV1/FVC RATIO: 73 %
Post FEV1/FVC ratio: 72 %
Pre FEV6/FVC Ratio: 95 %
RV % pred: 94 %
RV: 1.89 L
TLC % PRED: 87 %
TLC: 5.23 L

## 2014-04-25 LAB — BASIC METABOLIC PANEL
Anion gap: 15 (ref 5–15)
BUN: 15 mg/dL (ref 6–23)
CO2: 23 meq/L (ref 19–32)
Calcium: 8.9 mg/dL (ref 8.4–10.5)
Chloride: 101 mEq/L (ref 96–112)
Creatinine, Ser: 0.87 mg/dL (ref 0.50–1.35)
GFR calc Af Amer: >90 mL/min (ref >90)
GLUCOSE: 128 mg/dL — AB (ref 70–99)
Potassium: 3.9 mEq/L (ref 3.7–5.3)
Sodium: 139 mEq/L (ref 137–147)

## 2014-04-25 LAB — HEPARIN LEVEL (UNFRACTIONATED)
Heparin Unfractionated: 0.17 IU/mL — ABNORMAL LOW (ref 0.30–0.70)
Heparin Unfractionated: 0.43 IU/mL (ref 0.30–0.70)
Heparin Unfractionated: 0.47 IU/mL (ref 0.30–0.70)

## 2014-04-25 MED ORDER — ALBUTEROL SULFATE (2.5 MG/3ML) 0.083% IN NEBU
2.5000 mg | INHALATION_SOLUTION | Freq: Once | RESPIRATORY_TRACT | Status: AC
  Administered 2014-04-25: 2.5 mg via RESPIRATORY_TRACT

## 2014-04-25 NOTE — Progress Notes (Addendum)
ANTICOAGULATION CONSULT NOTE - Follow Up Consult  Pharmacy Consult for Heparin  Indication: 3v CAD awaiting CABG  No Known Allergies  Patient Measurements: Height: 5\' 7"  (170.2 cm) Weight: 203 lb (92.08 kg) IBW/kg (Calculated) : 66.1 Heparin Dosing Weight: 84 kg  Vital Signs: Temp: 98.3 F (36.8 C) (07/09 2045) Temp src: Oral (07/09 2045) BP: 117/66 mmHg (07/09 2045) Pulse Rate: 67 (07/09 2045)  Labs:  Recent Labs  04/23/14 1236 04/24/14 2210  HGB 14.4 13.4  HCT 42.6 39.9  PLT 196.0 173  LABPROT 10.6  --   INR 1.0  --   HEPARINUNFRC  --  <0.10*  CREATININE 1.0  --     Estimated Creatinine Clearance: 83.9 ml/min (by C-G formula based on Cr of 1).   Medications:  Heparin 1050 units/hr  Assessment: 63 y/o M with multi-vessel disease awaiting CABG after plavix washout. HL is undetectable after re-start this evening. Other labs as above.   Goal of Therapy:  Heparin level 0.3-0.7 units/ml Monitor platelets by anticoagulation protocol: Yes   Plan:  -Increase heparin to 1350 units/hr -0700 HL -Daily CBC/HL -Monitor for bleeding  Abran Duke 04/25/2014,12:01 AM  ================= Addendum 6:00 AM HL 0.17 drawn too soon after rate change -Re-drawn HL at 1200 JLedford, PharmD

## 2014-04-25 NOTE — Progress Notes (Signed)
[id number] Pt stated he does not have CP but a thumping like a toothache in his heart. York Spaniel it was doing it a little now. Did not walk with pt and will not ambulate unless orders given to do so. LM disease. Gave pt OHS booklet, care guide and put on pre op video for him to view. Stated son will be able to care for him at discharge. Discussed importance of mobility, sternal precautions and IS after surgery. Pt has IS and can reach 2500 ml. Encouraged him to use. Will follow up after surgery unless ambulation orders given. Luetta Nutting RN BSN 04/25/2014 11:52 AM

## 2014-04-25 NOTE — Progress Notes (Signed)
SUBJECTIVE:  No chest pain  OBJECTIVE:   Vitals:   Filed Vitals:   04/24/14 0911 04/24/14 1238 04/24/14 2045 04/25/14 0500  BP: 131/77 116/66 117/66 101/38  Pulse: 55 64 67 61  Temp: 97.7 F (36.5 C) 97.7 F (36.5 C) 98.3 F (36.8 C) 98.1 F (36.7 C)  TempSrc: Oral Oral Oral Oral  Resp: 18 18  18   Height:      Weight:    203 lb 14.4 oz (92.488 kg)  SpO2: 100% 83% 98% 99%   I&O's:   Intake/Output Summary (Last 24 hours) at 04/25/14 0726 Last data filed at 04/25/14 0349  Gross per 24 hour  Intake    720 ml  Output   3700 ml  Net  -2980 ml   TELEMETRY: Reviewed telemetry pt in NSR:     PHYSICAL EXAM General: Well developed, well nourished, in no acute distress Head: Eyes PERRLA, No xanthomas.   Normal cephalic and atramatic  Lungs:   Clear bilaterally to auscultation and percussion. Heart:   HRRR S1 S2 Pulses are 2+ & equal. Abdomen: Bowel sounds are positive, abdomen soft and non-tender without masses Extremities:   No clubbing, cyanosis or edema.  DP +1 Neuro: Alert and oriented X 3. Psych:  Good affect, responds appropriately   LABS: Basic Metabolic Panel:  Recent Labs  02/77/41 1236 04/25/14 0425  NA 137 139  K 4.2 3.9  CL 106 101  CO2 24 23  GLUCOSE 106* 128*  BUN 15 15  CREATININE 1.0 0.87  CALCIUM 8.7 8.9   Liver Function Tests: No results found for this basename: AST, ALT, ALKPHOS, BILITOT, PROT, ALBUMIN,  in the last 72 hours No results found for this basename: LIPASE, AMYLASE,  in the last 72 hours CBC:  Recent Labs  04/24/14 2210 04/25/14 0425  WBC 7.2 8.3  HGB 13.4 14.2  HCT 39.9 41.8  MCV 94.8 94.6  PLT 173 184   Cardiac Enzymes: No results found for this basename: CKTOTAL, CKMB, CKMBINDEX, TROPONINI,  in the last 72 hours BNP: No components found with this basename: POCBNP,  D-Dimer: No results found for this basename: DDIMER,  in the last 72 hours Hemoglobin A1C: No results found for this basename: HGBA1C,  in the last 72  hours Fasting Lipid Panel: No results found for this basename: CHOL, HDL, LDLCALC, TRIG, CHOLHDL, LDLDIRECT,  in the last 72 hours Thyroid Function Tests: No results found for this basename: TSH, T4TOTAL, FREET3, T3FREE, THYROIDAB,  in the last 72 hours Anemia Panel: No results found for this basename: VITAMINB12, FOLATE, FERRITIN, TIBC, IRON, RETICCTPCT,  in the last 72 hours Coag Panel:   Lab Results  Component Value Date   INR 1.0 04/23/2014   INR 1.36 07/29/2013   INR 0.99 07/29/2013    RADIOLOGY: No results found.  Assessment and Plan:  1. CAD: He is known to have a chronically occluded RCA, moderate Circumflex disease and recent DES placed proximal LAD in setting of acute MI October 2014. Now with chest pain c/w unstable angina. Cath yesterday showed severe 3 vessel ASCAD with LM stenosis confirmed by IVUS.  He has severe ostial left circ and diagonal disease and severe LV dysfunction. Continue ASA, beta blocker, Ace-inh, statin. CVTS plans for surgery next Thursday.  Discussed with Dr. Clifton James and feels patient needs to stay in hospital until surgery.  He just had a stent placed in prox LAD less 8 months ago and needs to be off Plavix so safest option  is to keep on IV Heparin until surgery to protect LAD stent.  He has also been having significant angina for the past 2 weeks.  Patient is agreeable. 2. Ischemic cardiomyopathy[facility] and 25% by cath. He has known ischemic cardiomyopathy with large anterior MI 8 months ago. He has been on optimal medical therapy for 8 months. He will need reassessment of EF 3 months after reevaluation.  Continue medical management with beta blocker, Ace-inh. Volume status is ok.  3. HTN: BP controlled.   4. GERD: Continue PPI.  5. 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    Quintella ReichertURNER,TRACI R, MD  04/25/2014  7:26 AM

## 2014-04-25 NOTE — Progress Notes (Signed)
ANTICOAGULATION CONSULT NOTE - Follow Up Consult  Pharmacy Consult for heparin Indication: 3v CAD awaiting CABG  No Known Allergies  Patient Measurements: Height: 5\' 7"  (170.2 cm) Weight: 203 lb 14.4 oz (92.488 kg) IBW/kg (Calculated) : 66.1 Heparin Dosing Weight: 84 kg  Vital Signs: Temp: 98.5 F (36.9 C) (07/10 1500) Temp src: Oral (07/10 1500) BP: 105/53 mmHg (07/10 1500) Pulse Rate: 63 (07/10 1500)  Labs:  Recent Labs  04/23/14 1236 04/24/14 2210 04/25/14 0425 04/25/14 1155  HGB 14.4 13.4 14.2  --   HCT 42.6 39.9 41.8  --   PLT 196.0 173 184  --   LABPROT 10.6  --   --   --   INR 1.0  --   --   --   HEPARINUNFRC  --  <0.10* 0.17* 0.43  CREATININE 1.0  --  0.87  --     Estimated Creatinine Clearance: 96.7 ml/min (by C-G formula based on Cr of 0.87).   Medications:  Scheduled:  . aspirin EC  81 mg Oral Daily  . atorvastatin  40 mg Oral q1800  . carvedilol  3.125 mg Oral BID WC  . pantoprazole  40 mg Oral Daily  . ramipril  1.25 mg Oral Daily  . spironolactone  25 mg Oral Daily   Infusions:  . heparin 1,350 Units/hr (04/25/14 1350)    Assessment: 63 yo male with 3v CAD awaiting CABG is currently on therapeutic heparin. Heparin level is 0.47. Goal of Therapy:  Heparin level 0.3-0.7 units/ml Monitor platelets by anticoagulation protocol: Yes   Plan:  1) Continue heparin at 1350 units/hr 2) Heparin level and CBC in am  Erva Koke, Tsz-Yin 04/25/2014,5:15 PM

## 2014-04-25 NOTE — Progress Notes (Signed)
ANTICOAGULATION CONSULT NOTE - Follow Up Consult  Pharmacy Consult for Heparin  Indication: 3v CAD awaiting CABG  No Known Allergies  Patient Measurements: Height: 5\' 7"  (170.2 cm) Weight: 203 lb 14.4 oz (92.488 kg) IBW/kg (Calculated) : 66.1 Heparin Dosing Weight: 84 kg  Vital Signs: Temp: 98.1 F (36.7 C) (07/10 0500) Temp src: Oral (07/10 0500) BP: 98/60 mmHg (07/10 0747) Pulse Rate: 60 (07/10 0747)  Labs:  Recent Labs  04/23/14 1236 04/24/14 2210 04/25/14 0425 04/25/14 1155  HGB 14.4 13.4 14.2  --   HCT 42.6 39.9 41.8  --   PLT 196.0 173 184  --   LABPROT 10.6  --   --   --   INR 1.0  --   --   --   HEPARINUNFRC  --  <0.10* 0.17* 0.43  CREATININE 1.0  --  0.87  --     Estimated Creatinine Clearance: 96.7 ml/min (by C-G formula based on Cr of 0.87).   Medications:  Heparin 1350 units/hr   Assessment: 63 y/o M with multi-vessel disease awaiting CABG after plavix washout. HL this afternoon was therapeutic. H/H and Plt stable. No signs of bleeding noted.   Goal of Therapy:  Heparin level 0.3-0.7 units/ml Monitor platelets by anticoagulation protocol: Yes   Plan:  -Continue heparin infusion at 1350 units/hr -2000 HL - confirmatory  -Daily CBC/HL -Monitor for bleeding  Vinnie Level, PharmD.  Clinical Pharmacist Pager [phone]

## 2014-04-25 NOTE — Progress Notes (Signed)
Pt a/o, no c/o pain, pt on heparin gtt at 13.5 ml/hr, pt stable

## 2014-04-26 LAB — CBC
HEMATOCRIT: 41.1 % (ref 39.0–52.0)
Hemoglobin: 14.2 g/dL (ref 13.0–17.0)
MCH: 32.6 pg (ref 26.0–34.0)
MCHC: 34.5 g/dL (ref 30.0–36.0)
MCV: 94.5 fL (ref 78.0–100.0)
PLATELETS: 168 10*3/uL (ref 150–400)
RBC: 4.35 MIL/uL (ref 4.22–5.81)
RDW: 13.7 % (ref 11.5–15.5)
WBC: 7.8 10*3/uL (ref 4.0–10.5)

## 2014-04-26 LAB — HEPARIN LEVEL (UNFRACTIONATED): Heparin Unfractionated: 0.5 IU/mL (ref 0.30–0.70)

## 2014-04-26 NOTE — Progress Notes (Signed)
ANTICOAGULATION CONSULT NOTE - Follow Up Consult  Pharmacy Consult for Heparin  Indication: 3v CAD awaiting CABG  No Known Allergies  Patient Measurements: Height: 5\' 7"  (170.2 cm) Weight: 203 lb 14.4 oz (92.488 kg) IBW/kg (Calculated) : 66.1 Heparin Dosing Weight: 84 kg  Vital Signs: Temp: 98.3 F (36.8 C) (07/11 0500) Temp src: Oral (07/11 0500) BP: 119/63 mmHg (07/11 0500) Pulse Rate: 63 (07/11 0500)  Labs:  Recent Labs  04/23/14 1236  04/24/14 2210 04/25/14 0425 04/25/14 1155 04/25/14 1944 04/26/14 0340  HGB 14.4  --  13.4 14.2  --   --  14.2  HCT 42.6  --  39.9 41.8  --   --  41.1  PLT 196.0  --  173 184  --   --  168  LABPROT 10.6  --   --   --   --   --   --   INR 1.0  --   --   --   --   --   --   HEPARINUNFRC  --   < > <0.10* 0.17* 0.43 0.47 0.50  CREATININE 1.0  --   --  0.87  --   --   --   < > = values in this interval not displayed.  Estimated Creatinine Clearance: 96.7 ml/min (by C-G formula based on Cr of 0.87).   Assessment: 63 y/o M with LM Dz and 3V CAD -  awaiting CABG after plavix washout.  P2Y12 level ordered for tomorrow.  Heparin drip 1350 uts/hr HL 0.5 at goal.  CBC stable, no bleeding noted.     Goal of Therapy:  Heparin level 0.3-0.7 units/ml Monitor platelets by anticoagulation protocol: Yes   Plan:  -Continue heparin infusion at 1350 units/hr -Daily CBC/HL -Monitor for bleeding  Leota Sauers Pharm.D. CPP, BCPS Clinical Pharmacist [phone] 04/26/2014 8:56 AM

## 2014-04-26 NOTE — Progress Notes (Signed)
Patient ID: Peter STUTEVILLE Sr., male   DOB: 11-08-1950, 63 y.o.   MRN: [id number]  SUBJECTIVE:  No chest pain arms bruising   OBJECTIVE:   Vitals:   Filed Vitals:   04/25/14 0747 04/25/14 1500 04/25/14 2056 04/26/14 0500  BP: 98/60 105/53 99/57 119/63  Pulse: 60 63 90 63  Temp:  98.5 F (36.9 C) 98 F (36.7 C) 98.3 F (36.8 C)  TempSrc:  Oral Oral Oral  Resp: 18 20 20 20   Height:      Weight:      SpO2:  98% 98% 98%   I&O's:    Intake/Output Summary (Last 24 hours) at 04/26/14 0756 Last data filed at 04/26/14 0500  Gross per 24 hour  Intake 1235.5 ml  Output   2000 ml  Net -764.5 ml   TELEMETRY: Reviewed telemetry pt in NSR:     PHYSICAL EXAM General: Well developed, well nourished, in no acute distress Head: Eyes PERRLA, No xanthomas.   Normal cephalic and atramatic  Lungs:   Clear bilaterally to auscultation and percussion. Heart:   HRRR S1 S2 Pulses are 2+ & equal. Abdomen: Bowel sounds are positive, abdomen soft and non-tender without masses Extremities:   No clubbing, cyanosis or edema.  DP +1 Neuro: Alert and oriented X 3. Psych:  Good affect, responds appropriately   LABS: Basic Metabolic Panel:  Recent Labs  02/72/53 1236 04/25/14 0425  NA 137 139  K 4.2 3.9  CL 106 101  CO2 24 23  GLUCOSE 106* 128*  BUN 15 15  CREATININE 1.0 0.87  CALCIUM 8.7 8.9   CBC:  Recent Labs  04/25/14 0425 04/26/14 0340  WBC 8.3 7.8  HGB 14.2 14.2  HCT 41.8 41.1  MCV 94.6 94.5  PLT 184 168   Coag Panel:   Lab Results  Component Value Date   INR 1.0 04/23/2014   INR 1.36 07/29/2013   INR 0.99 07/29/2013    RADIOLOGY: No results found.  Assessment and Plan:  1. CAD: He is known to have a chronically occluded RCA, moderate Circumflex disease and recent DES placed proximal LAD in setting of acute MI October 2014. Now with chest pain c/w unstable angina. Cath yesterday showed severe 3 vessel ASCAD with LM stenosis confirmed by IVUS.  He has severe ostial  left circ and diagonal disease and severe LV dysfunction. Continue ASA, beta blocker, Ace-inh, statin. CVTS plans for surgery next Thursday.  Keep in hospital on heparin until surgery Will check P2Y 2. Ischemic cardiomyopathy: LVEF=27% by [facility] March 2015 and 25% by cath. He has known ischemic cardiomyopathy with large anterior MI 8 months ago. He has been on optimal medical therapy for 8 months. He will need reassessment of EF 3 months after reevaluation.  Continue medical management with beta blocker, Ace-inh. Volume status is ok.  3. HTN: BP controlled.   4. GERD: Continue PPI.  5. 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    Charlton Haws, MD  04/26/2014  7:56 AM

## 2014-04-27 LAB — CBC
HCT: 43 % (ref 39.0–52.0)
Hemoglobin: 14.7 g/dL (ref 13.0–17.0)
MCH: 32.5 pg (ref 26.0–34.0)
MCHC: 34.2 g/dL (ref 30.0–36.0)
MCV: 94.9 fL (ref 78.0–100.0)
PLATELETS: 168 10*3/uL (ref 150–400)
RBC: 4.53 MIL/uL (ref 4.22–5.81)
RDW: 13.9 % (ref 11.5–15.5)
WBC: 7.7 10*3/uL (ref 4.0–10.5)

## 2014-04-27 LAB — HEPARIN LEVEL (UNFRACTIONATED): HEPARIN UNFRACTIONATED: 0.45 [IU]/mL (ref 0.30–0.70)

## 2014-04-27 LAB — PLATELET INHIBITION P2Y12: Platelet Function  P2Y12: 216 [PRU] (ref 194–418)

## 2014-04-27 NOTE — Progress Notes (Signed)
ANTICOAGULATION CONSULT NOTE - Follow Up Consult  Pharmacy Consult for Heparin  Indication: 3v CAD awaiting CABG  No Known Allergies  Patient Measurements: Height: 5\' 7"  (170.2 cm) Weight: 203 lb 8 oz (92.307 kg) IBW/kg (Calculated) : 66.1 Heparin Dosing Weight: 84 kg  Vital Signs: Temp: 98 F (36.7 C) (07/12 0500) BP: 121/78 mmHg (07/12 0500) Pulse Rate: 62 (07/12 0500)  Labs:  Recent Labs  04/25/14 0425  04/25/14 1944 04/26/14 0340 04/27/14 0506  HGB 14.2  --   --  14.2 14.7  HCT 41.8  --   --  41.1 43.0  PLT 184  --   --  168 168  HEPARINUNFRC 0.17*  < > 0.47 0.50 0.45  CREATININE 0.87  --   --   --   --   < > = values in this interval not displayed.  Estimated Creatinine Clearance: 96.6 ml/min (by C-G formula based on Cr of 0.87).   Assessment: 63 y/o M with LM disease and 3V CAD -  awaiting CABG after plavix washout.  P2Y12 level ordered for this morning.  Heparin drip 1350 units/hr with today's HL 0.45 at goal.  CBC stable, no bleeding noted.     Goal of Therapy:  Heparin level 0.3-0.7 units/ml Monitor platelets by anticoagulation protocol: Yes   Plan:  -Continue heparin infusion at 1350 units/hr -Daily CBC/HL -Monitor for bleeding  Jeweliana Dudgeon E. Jini Horiuchi, Pharm.D Clinical Pharmacy Resident Pager: [phone] 04/27/2014 9:20 AM

## 2014-04-27 NOTE — Progress Notes (Signed)
Patient ID: Peter JOUNG Sr., male   DOB: October 18, 1950, 63 y.o.   MRN: [id number]  SUBJECTIVE:  No chest pain  Anxious to get surgery done   OBJECTIVE:   Vitals:   Filed Vitals:   04/26/14 0500 04/26/14 1400 04/26/14 2100 04/27/14 0500  BP: 119/63 103/57 116/65 121/78  Pulse: 63 72 55 62  Temp: 98.3 F (36.8 C) 98.8 F (37.1 C) 97.8 F (36.6 C) 98 F (36.7 C)  TempSrc: Oral Oral    Resp: 20 18 18 18   Height:      Weight:    203 lb 8 oz (92.307 kg)  SpO2: 98% 97% 97% 98%   I&O's:    Intake/Output Summary (Last 24 hours) at 04/27/14 0755 Last data filed at 04/27/14 0500  Gross per 24 hour  Intake    762 ml  Output   2300 ml  Net  -1538 ml   TELEMETRY: Reviewed telemetry pt in NSR:     PHYSICAL EXAM General: Well developed, well nourished, in no acute distress Head: Eyes PERRLA, No xanthomas.   Normal cephalic and atramatic  Lungs:   Clear bilaterally to auscultation and percussion. Heart:   HRRR S1 S2 Pulses are 2+ & equal. Abdomen: Bowel sounds are positive, abdomen soft and non-tender without masses Extremities:   No clubbing, cyanosis or edema.  DP +1 Neuro: Alert and oriented X 3. Psych:  Good affect, responds appropriately   LABS: Basic Metabolic Panel:  Recent Labs  54/27/06 0425  NA 139  K 3.9  CL 101  CO2 23  GLUCOSE 128*  BUN 15  CREATININE 0.87  CALCIUM 8.9   CBC:  Recent Labs  04/26/14 0340 04/27/14 0506  WBC 7.8 7.7  HGB 14.2 14.7  HCT 41.1 43.0  MCV 94.5 94.9  PLT 168 168   Coag Panel:   Lab Results  Component Value Date   INR 1.0 04/23/2014   INR 1.36 07/29/2013   INR 0.99 07/29/2013    RADIOLOGY: No results found.  Assessment and Plan:  1. CAD: He is known to have a chronically occluded RCA, moderate Circumflex disease and recent DES placed proximal LAD in setting of acute MI October 2014. Now with chest pain c/w unstable angina. Cath yesterday showed severe 3 vessel ASCAD with LM stenosis confirmed by IVUS.  He has severe  ostial left circ and diagonal disease and severe LV dysfunction. Continue ASA, beta blocker, Ace-inh, statin. CVTS plans for surgery next Thursday.  Keep in hospital on heparin until surgery P2Y pending from this am  2. Ischemic cardiomyopathy: LVEF=27% by [facility] March 2015 and 25% by cath. He has known ischemic cardiomyopathy with large anterior MI 8 months ago. He has been on optimal medical therapy for 8 months. He will need reassessment of EF 3 months after reevaluation.  Continue medical management with beta blocker, Ace-inh. Volume status is ok.  3. HTN: BP controlled.   4. GERD: Continue PPI.  5. 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    Charlton Haws, MD  04/27/2014  7:55 AM

## 2014-04-28 DIAGNOSIS — I4729 Other ventricular tachycardia: Secondary | ICD-10-CM | POA: Diagnosis not present

## 2014-04-28 DIAGNOSIS — I472 Ventricular tachycardia: Secondary | ICD-10-CM | POA: Diagnosis not present

## 2014-04-28 DIAGNOSIS — Z9189 Other specified personal risk factors, not elsewhere classified: Secondary | ICD-10-CM | POA: Diagnosis present

## 2014-04-28 LAB — HEPARIN LEVEL (UNFRACTIONATED): Heparin Unfractionated: 0.36 IU/mL (ref 0.30–0.70)

## 2014-04-28 LAB — CBC
HCT: 44.9 % (ref 39.0–52.0)
Hemoglobin: 14.9 g/dL (ref 13.0–17.0)
MCH: 32.6 pg (ref 26.0–34.0)
MCHC: 33.2 g/dL (ref 30.0–36.0)
MCV: 98.2 fL (ref 78.0–100.0)
PLATELETS: 161 10*3/uL (ref 150–400)
RBC: 4.57 MIL/uL (ref 4.22–5.81)
RDW: 14.2 % (ref 11.5–15.5)
WBC: 7.9 10*3/uL (ref 4.0–10.5)

## 2014-04-28 NOTE — Care Management Note (Unsigned)
    Page 1 of 1   04/28/2014     3:08:22 PM CARE MANAGEMENT NOTE 04/28/2014  Patient:  Peter Booker, Peter Booker   Account Number:  [id number]  Date Initiated:  04/28/2014  Documentation initiated by:  GRAVES-BIGELOW,Danie Diehl  Subjective/Objective Assessment:   Pt in with cp. S/p cath revealed 3V CAD. Plan for CABG on Thursday due to Plavix washout.     Action/Plan:   CM will continue to monitor for disposition needs.   Anticipated DC Date:  05/05/2014   Anticipated DC Plan:  HOME W HOME HEALTH SERVICES      DC Planning Services  CM consult      Choice offered to / List presented to:             Status of service:  In process, will continue to follow Medicare Important Message given?  NO (If response is "NO", the following Medicare IM given date fields will be blank) Date Medicare IM given:   Medicare IM given by:   Date Additional Medicare IM given:   Additional Medicare IM given by:    Discharge Disposition:    Per UR Regulation:  Reviewed for med. necessity/level of care/duration of stay  If discussed at Long Length of Stay Meetings, dates discussed:   04/29/2014    Comments:

## 2014-04-28 NOTE — Progress Notes (Signed)
63 yo male with history of CAD, GERD, HTN, tobacco abuse, anxiety who is here today for cardiac cath. He has been followed in the past by Dr. Sharyn Lull. He was admitted to [facility] 07/29/13 with an acute anterolateral MI. Emergent cath per Dr. Sharyn Lull with 100% occlusion proximal LAD treated with DES x 1. The Circumflex had moderate disease. The RCA was occluded proximally and filled from collaterals. Echo 07/30/13 with LVEF=30-35%. No significant valve issues. He was seen in the office 04/23/14 and has had left sided chest pain/pressure for 2 weeks, worsened with exertion.  Cardiac cath with 3 vessel CAD with Lt main stenosis. 70%, EF 25%.  Continue hospitalization and IV heparin for CAD and Plavix wash out.  Keep on tele, secondary to risk sudden cardiac death with severe 3 vessel CAD.    Subjective: No complaints, no chest pain, no SOB  Objective: Vital signs in last 24 hours: Temp:  [97.5 F (36.4 C)-97.8 F (36.6 C)] 97.6 F (36.4 C) (07/13 0500) Pulse Rate:  [67-73] 70 (07/13 0500) Resp:  [18-20] 20 (07/13 0500) BP: (108-133)/(66-95) 108/95 mmHg (07/13 0500) SpO2:  [97 %-98 %] 97 % (07/13 0500) Weight:  [195 lb (88.451 kg)] 195 lb (88.451 kg) (07/13 0500) Weight change: -8 lb 8 oz (-3.856 kg) Last BM Date: 04/24/14 Intake/Output from previous day: +262 (since admit -5047) wt 195 down from 203 07/12 0701 - 07/13 0700 In: 1362 [P.O.:1080; I.V.:162] Out: 1100 [Urine:1100] Intake/Output this shift: Total I/O In: 240 [P.O.:240] Out: 1100 [Urine:1100]  PE: General:Pleasant affect, NAD Skin:Warm and dry, brisk capillary refill HEENT:normocephalic, sclera clear, mucus membranes moist Heart:S1S2 RRR without murmur, gallup, rub or click Lungs:clear without rales, rhonchi, or wheezes NKN:LZJQ, non tender, + BS, do not palpate liver spleen or masses Ext:no lower ext edema, 2+ pedal pulses, 2+ radial pulses Neuro:alert and oriented, MAE, follows commands, + facial symmetry   Lab  Results:  Recent Labs  04/26/14 0340 04/27/14 0506  WBC 7.8 7.7  HGB 14.2 14.7  HCT 41.1 43.0  PLT 168 168      Studies/Results: Carotid dopplers: Bilateral: mild soft plaque distal CCA and proximal ICA. 1-39% ICA stenosis. Vertebral artery flow is antegrade. ICA/CCA ratio: R-1.48 L-1.66.   Medications: I have reviewed the patient's current medications. Scheduled Meds: . aspirin EC  81 mg Oral Daily  . atorvastatin  40 mg Oral q1800  . carvedilol  3.125 mg Oral BID WC  . pantoprazole  40 mg Oral Daily  . ramipril  1.25 mg Oral Daily  . spironolactone  25 mg Oral Daily   Continuous Infusions: . heparin 1,350 Units/hr (04/25/14 1350)   PRN Meds:.acetaminophen, albuterol, colchicine, nitroGLYCERIN, ondansetron (ZOFRAN) IV, ondansetron  Assessment/Plan: Assessment and Plan:  1. CAD: He is known to have a chronically occluded RCA, moderate Circumflex disease and recent DES placed proximal LAD in setting of acute MI October 2014. Now with chest pain c/w unstable angina. Cath  04/24/14 showed severe 3 vessel ASCAD with LM stenosis confirmed by IVUS. He has severe ostial left circ and diagonal disease and severe LV dysfunction. Continue ASA, beta blocker, Ace-inh, statin. CVTS plans for surgery next Thursday. Discussed with Dr. Clifton James and feels patient needs to stay in hospital until surgery. He just had a stent placed in prox LAD less 8 months ago and needs to be off Plavix so safest option is to keep on IV Heparin until surgery to protect LAD stent. He has also been having significant angina  for the past 2 weeks. Patient is agreeable. P2Y12 is 216 2. Ischemic [facility]: [facility] and 25% by cath. He has known ischemic cardiomyopathy with large anterior MI 8 months ago. He has been on optimal medical therapy for 8 months. He will need reassessment of EF 3 months after reevaluation. Continue medical management with beta blocker, Ace-inh. Volume status is ok.  Continue tele. 3. HTN: BP controlled.  4. GERD: Continue PPI.  5. 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  6. High risk sudden cardiac death with hx MI, EF 25%,  7. Hyperlipidemia- last lipid 07/2013 recheck, on lipitor  8.  NSVT  4-5 beats   LOS: 4 days   Time spent with pt. :15 minutes. [facility] CenterNGOLD,LAURA R  Nurse Practitioner Certified Pager [phone] or after 5pm and on weekends call [phone] 04/28/2014, 6:24 AM   Patient seen and examined. Agree with assessment and plan. Pt on iv heparin awaiting CABG, possible sooner than Thursday. Will need LV fxn re-assessment 3 months post revascularization to assess if ICD necessary in future.   Lennette Biharihomas A. Kelly, MD, [facility] CampusFACC 04/28/2014 9:05 AM

## 2014-04-28 NOTE — Progress Notes (Signed)
UR Completed Hoby Kawai Graves-Bigelow, RN,BSN [phone]  

## 2014-04-28 NOTE — Progress Notes (Signed)
ANTICOAGULATION CONSULT NOTE - Follow Up Consult  Pharmacy Consult for Heparin  Indication: 3v CAD awaiting CABG  No Known Allergies  Patient Measurements: Height: 5\' 7"  (170.2 cm) Weight: 195 lb (88.451 kg) IBW/kg (Calculated) : 66.1 Heparin Dosing Weight: 84 kg  Vital Signs: Temp: 97.6 F (36.4 C) (07/13 0500) BP: 108/95 mmHg (07/13 0500) Pulse Rate: 70 (07/13 0500)  Labs:  Recent Labs  04/26/14 0340 04/27/14 0506 04/28/14 0635  HGB 14.2 14.7 14.9  HCT 41.1 43.0 44.9  PLT 168 168 161  HEPARINUNFRC 0.50 0.45 0.36    Estimated Creatinine Clearance: 94.7 ml/min (by C-G formula based on Cr of 0.87).  Assessment: 63 y/o M with LM disease and 3V CAD continues on IV heparin and is awaiting CABG after plavix washout.  P2Y12 level is 216. Heparin level remains therapeutic at 0.36 and CBC is WNL. No bleeding noted.   Goal of Therapy:  Heparin level 0.3-0.7 units/ml Monitor platelets by anticoagulation protocol: Yes   Plan:  1. Continue heparin gtt 1350 units/hr 2. F/u AM heparin level and CBC  Lysle Pearl, PharmD, BCPS Pager # [phone] 04/28/2014 10:02 AM

## 2014-04-29 LAB — BASIC METABOLIC PANEL
Anion gap: 15 (ref 5–15)
BUN: 17 mg/dL (ref 6–23)
CALCIUM: 9.4 mg/dL (ref 8.4–10.5)
CO2: 22 meq/L (ref 19–32)
Chloride: 101 mEq/L (ref 96–112)
Creatinine, Ser: 0.8 mg/dL (ref 0.50–1.35)
GFR calc Af Amer: >90 mL/min (ref >90)
GFR calc non Af Amer: >90 mL/min (ref >90)
Glucose, Bld: 108 mg/dL — ABNORMAL HIGH (ref 70–99)
Potassium: 4.2 mEq/L (ref 3.7–5.3)
Sodium: 138 mEq/L (ref 137–147)

## 2014-04-29 LAB — CBC
HEMATOCRIT: 43.3 % (ref 39.0–52.0)
Hemoglobin: 14.5 g/dL (ref 13.0–17.0)
MCH: 32.4 pg (ref 26.0–34.0)
MCHC: 33.5 g/dL (ref 30.0–36.0)
MCV: 96.9 fL (ref 78.0–100.0)
Platelets: 154 10*3/uL (ref 150–400)
RBC: 4.47 MIL/uL (ref 4.22–5.81)
RDW: 14.1 % (ref 11.5–15.5)
WBC: 8.4 10*3/uL (ref 4.0–10.5)

## 2014-04-29 LAB — LIPID PANEL
CHOL/HDL RATIO: 2.8 ratio
Cholesterol: 170 mg/dL (ref 0–200)
HDL: 60 mg/dL (ref >39)
LDL Cholesterol: 89 mg/dL (ref 0–99)
Triglycerides: 104 mg/dL (ref <150)
VLDL: 21 mg/dL (ref 0–40)

## 2014-04-29 LAB — HEPARIN LEVEL (UNFRACTIONATED): HEPARIN UNFRACTIONATED: 0.5 [IU]/mL (ref 0.30–0.70)

## 2014-04-29 NOTE — Progress Notes (Signed)
63 yo male with history of CAD, GERD, HTN, tobacco abuse, anxiety who is here today for cardiac cath. He has been followed in the past by Dr. Sharyn LullHarwani. [facility] with an acute anterolateral MI. Emergent cath per Dr. Sharyn LullHarwani with 100% occlusion proximal LAD treated with DES x 1. The Circumflex had moderate disease. The RCA was occluded proximally and filled from collaterals. Echo 07/30/13 with LVEF=30-35%. No significant valve issues. He was seen in the office 04/23/14 and has had left sided chest pain/pressure for 2 weeks, worsened with exertion. Cardiac cath with 3 vessel CAD with Lt main stenosis. 70%, EF 25%. Continue hospitalization and IV heparin for CAD and Plavix wash out. Keep on tele, secondary to risk sudden cardiac death with severe 3 vessel CAD    Subjective: No complaints, no SOB no chest pain  Objective: Vital signs in last 24 hours: Temp:  [97.4 F (36.3 C)-97.7 F (36.5 C)] 97.4 F (36.3 C) (07/14 0555) Pulse Rate:  [61-71] 71 (07/14 0555) Resp:  [18] 18 (07/14 0555) BP: (108-123)/(74-88) 123/79 mmHg (07/14 0555) SpO2:  [94 %-98 %] 98 % (07/14 0555) Weight change:  Last BM Date: 04/27/14 Intake/Output from previous day: 07/13 0701 - 07/14 0700 In: -  Out: 1650 [Urine:1650] Intake/Output this shift:    PE: General:Pleasant affect, NAD Skin:Warm and dry, brisk capillary refill HEENT:normocephalic, sclera clear, mucus membranes moist Heart:S1S2 RRR without murmur, gallup, rub or click Lungs:clear without rales, occ rhonchi, + wheezes VQQ:VZDGAbd:soft, non tender, + BS, do not palpate liver spleen or masses Ext:no lower ext edema, 2+ radial pulses Neuro:alert and oriented X 3, MAE, follows commands, + facial symmetry  tele:SR with occ coupled PVCs  Lab Results:  Recent Labs  04/28/14 0635 04/29/14 0500  WBC 7.9 8.4  HGB 14.9 14.5  HCT 44.9 43.3  PLT 161 154   BMET  Recent Labs  04/29/14 0500  NA 138  K 4.2  CL 101  CO2 22    GLUCOSE 108*  BUN 17  CREATININE 0.80  CALCIUM 9.4   No results found for this basename: TROPONINI, CK, MB,  in the last 72 hours  Lab Results  Component Value Date   CHOL 170 04/29/2014   HDL 60 04/29/2014   LDLCALC 89 04/29/2014   TRIG 104 04/29/2014   CHOLHDL 2.8 04/29/2014       Studies/Results: No results found.  Medications: I have reviewed the patient's current medications. Scheduled Meds: . aspirin EC  81 mg Oral Daily  . atorvastatin  40 mg Oral q1800  . carvedilol  3.125 mg Oral BID WC  . pantoprazole  40 mg Oral Daily  . ramipril  1.25 mg Oral Daily  . spironolactone  25 mg Oral Daily   Continuous Infusions: . heparin 1,350 Units/hr (04/28/14 2056)   PRN Meds:.acetaminophen, albuterol, colchicine, nitroGLYCERIN, ondansetron (ZOFRAN) IV, ondansetron  Assessment/Plan:  Assessment and Plan:  1. CAD: He is known to have a chronically occluded RCA, moderate Circumflex disease and recent DES placed proximal LAD in setting of acute MI October 2014. Now with chest pain c/w unstable angina. Cath 04/24/14 showed severe 3 vessel ASCAD with LM stenosis confirmed by IVUS. He has severe ostial left circ and diagonal disease and severe LV dysfunction. Continue ASA, beta blocker, Ace-inh, statin. CVTS plans for surgery next Thursday. Discussed with Dr. Clifton JamesMcAlhany and feels patient needs to stay in hospital until surgery. He just had a stent placed in prox LAD  less 8 months ago and needs to be off Plavix so safest option is to keep on IV Heparin until surgery to protect LAD stent. He has also been having significant angina for the past 2 weeks. Patient is agreeable. P2Y12 is 216  2. Ischemic cardiomyopathy: LVEF=27% by [facility] March 2015 and 25% by cath. He has known ischemic cardiomyopathy with large anterior MI 8 months ago. He has been on optimal medical therapy for 8 months. He will need reassessment of EF 3 months after reevaluation. Continue medical management with beta blocker,  Ace-inh. Volume status is ok. Continue tele.  3. HTN: BP controlled.  4. GERD: Continue PPI.  5. 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  6. High risk sudden cardiac death with hx MI, EF 25%,  7. Hyperlipidemia- last lipid 07/2013 recheck, on lipitor  8. NSVT 4-5 beats     LOS: 5 days   Time spent with pt. :15 minutes. [facility] R  Nurse Practitioner Certified Pager [phone] or after 5pm and on weekends call [phone] 04/29/2014, 8:03 AM    Patient seen and examined. Agree with assessment and plan. No recurrent anginal symptoms. On IV heparin with Plavix washout; awaiting CABG this week.   Lennette Bihari, MD, [facility] 04/29/2014 9:38 AM

## 2014-04-29 NOTE — Progress Notes (Signed)
5 Days Post-Op Procedure(s) (LRB): LEFT HEART CATHETERIZATION WITH CORONARY ANGIOGRAM (N/A) Subjective:  No chest pain or shortness of breath  Has been ambulating  Objective: Vital signs in last 24 hours: Temp:  [97.4 F (36.3 C)-98 F (36.7 C)] 98 F (36.7 C) (07/14 1410) Pulse Rate:  [61-71] 64 (07/14 1410) Cardiac Rhythm:  [-] Sinus bradycardia (07/14 0745) Resp:  [18] 18 (07/14 1410) BP: (108-123)/(61-79) 108/61 mmHg (07/14 1410) SpO2:  [95 %-98 %] 96 % (07/14 1410)  Hemodynamic parameters for last 24 hours:    Intake/Output from previous day: 07/13 0701 - 07/14 0700 In: -  Out: 1650 [Urine:1650] Intake/Output this shift: Total I/O In: 720 [P.O.:720] Out: 650 [Urine:650]  General appearance: alert and cooperative Heart: regular rate and rhythm, S1, S2 normal, no murmur, click, rub or gallop Lungs: clear to auscultation bilaterally  Lab Results:  Recent Labs  04/28/14 0635 04/29/14 0500  WBC 7.9 8.4  HGB 14.9 14.5  HCT 44.9 43.3  PLT 161 154   BMET:  Recent Labs  04/29/14 0500  NA 138  K 4.2  CL 101  CO2 22  GLUCOSE 108*  BUN 17  CREATININE 0.80  CALCIUM 9.4    PT/INR: No results found for this basename: LABPROT, INR,  in the last 72 hours ABG    Component Value Date/Time   TCO2 19 07/29/2013 1332   CBG (last 3)  No results found for this basename: GLUCAP,  in the last 72 hours  Assessment/Plan:  Severe multi-vessel coronary disease. Plan CABG Thursday am.  LOS: 5 days    Evelene Croon K 04/29/2014

## 2014-04-29 NOTE — Progress Notes (Signed)
ANTICOAGULATION CONSULT NOTE - Follow Up Consult  Pharmacy Consult for Heparin  Indication: 3v CAD awaiting CABG  No Known Allergies  Patient Measurements: Height: 5\' 7"  (170.2 cm) Weight: 195 lb (88.451 kg) IBW/kg (Calculated) : 66.1 Heparin Dosing Weight: 84 kg  Vital Signs: Temp: 97.4 F (36.3 C) (07/14 0555) Temp src: Oral (07/14 0555) BP: 123/79 mmHg (07/14 0555) Pulse Rate: 71 (07/14 0555)  Labs:  Recent Labs  04/27/14 0506 04/28/14 0635 04/29/14 0500  HGB 14.7 14.9 14.5  HCT 43.0 44.9 43.3  PLT 168 161 154  HEPARINUNFRC 0.45 0.36 0.50  CREATININE  --   --  0.80    Estimated Creatinine Clearance: 103 ml/min (by C-G formula based on Cr of 0.8).  Assessment: 63 y/o M with LM disease and 3V CAD continues on IV heparin and is awaiting CABG after plavix washout.  P2Y12 level is 216 as of 7/13. Heparin level remains therapeutic at 0.5 and CBC is WNL. No bleeding noted.   Goal of Therapy:  Heparin level 0.3-0.7 units/ml Monitor platelets by anticoagulation protocol: Yes   Plan:  1. Continue heparin gtt 1350 units/hr 2. F/u AM heparin level and CBC  Lysle Pearl, PharmD, BCPS Pager # [phone] 04/29/2014 7:49 AM

## 2014-04-30 LAB — CBC
HCT: 42.5 % (ref 39.0–52.0)
Hemoglobin: 14.4 g/dL (ref 13.0–17.0)
MCH: 32.4 pg (ref 26.0–34.0)
MCHC: 33.9 g/dL (ref 30.0–36.0)
MCV: 95.5 fL (ref 78.0–100.0)
PLATELETS: 161 10*3/uL (ref 150–400)
RBC: 4.45 MIL/uL (ref 4.22–5.81)
RDW: 13.9 % (ref 11.5–15.5)
WBC: 8 10*3/uL (ref 4.0–10.5)

## 2014-04-30 LAB — HEPARIN LEVEL (UNFRACTIONATED): HEPARIN UNFRACTIONATED: 0.53 [IU]/mL (ref 0.30–0.70)

## 2014-04-30 LAB — SURGICAL PCR SCREEN
MRSA, PCR: NEGATIVE
STAPHYLOCOCCUS AUREUS: NEGATIVE

## 2014-04-30 LAB — TYPE AND SCREEN
ABO/RH(D): O POS
Antibody Screen: NEGATIVE

## 2014-04-30 MED ORDER — SODIUM CHLORIDE 0.9 % IV SOLN
INTRAVENOUS | Status: AC
  Administered 2014-05-01: 1.5 [IU]/h via INTRAVENOUS
  Filled 2014-04-30: qty 1

## 2014-04-30 MED ORDER — SODIUM CHLORIDE 0.9 % IV SOLN
INTRAVENOUS | Status: DC
  Filled 2014-04-30: qty 30

## 2014-04-30 MED ORDER — BISACODYL 5 MG PO TBEC
5.0000 mg | DELAYED_RELEASE_TABLET | Freq: Once | ORAL | Status: DC
Start: 2014-04-30 — End: 2014-05-01
  Filled 2014-04-30: qty 1

## 2014-04-30 MED ORDER — POTASSIUM CHLORIDE 2 MEQ/ML IV SOLN
80.0000 meq | INTRAVENOUS | Status: DC
  Filled 2014-04-30: qty 40

## 2014-04-30 MED ORDER — CHLORHEXIDINE GLUCONATE 4 % EX LIQD
CUTANEOUS | Status: AC
  Administered 2014-04-30: 21:00:00
  Filled 2014-04-30: qty 15

## 2014-04-30 MED ORDER — MAGNESIUM SULFATE 50 % IJ SOLN
40.0000 meq | INTRAMUSCULAR | Status: DC
  Filled 2014-04-30: qty 10

## 2014-04-30 MED ORDER — DEXMEDETOMIDINE HCL IN NACL 400 MCG/100ML IV SOLN
0.1000 ug/kg/h | INTRAVENOUS | Status: AC
  Administered 2014-05-01: 0.3 ug/kg/h via INTRAVENOUS
  Filled 2014-04-30 (×2): qty 100

## 2014-04-30 MED ORDER — CHLORHEXIDINE GLUCONATE CLOTH 2 % EX PADS: 6.0000 | MEDICATED_PAD | Freq: Once | CUTANEOUS | Status: DC

## 2014-04-30 MED ORDER — EPINEPHRINE HCL 1 MG/ML IJ SOLN
0.5000 ug/min | INTRAVENOUS | Status: DC
  Filled 2014-04-30: qty 4

## 2014-04-30 MED ORDER — VANCOMYCIN HCL 10 G IV SOLR
1500.0000 mg | INTRAVENOUS | Status: AC
  Administered 2014-05-01: 1500 mg via INTRAVENOUS
  Filled 2014-04-30 (×2): qty 1500

## 2014-04-30 MED ORDER — TEMAZEPAM 15 MG PO CAPS
15.0000 mg | ORAL_CAPSULE | Freq: Once | ORAL | Status: AC | PRN
  Administered 2014-04-30: 15 mg via ORAL
  Filled 2014-04-30: qty 1

## 2014-04-30 MED ORDER — CHLORHEXIDINE GLUCONATE 4 % EX LIQD
CUTANEOUS | Status: AC
  Filled 2014-04-30: qty 105

## 2014-04-30 MED ORDER — DOPAMINE-DEXTROSE 3.2-5 MG/ML-% IV SOLN
2.0000 ug/kg/min | INTRAVENOUS | Status: AC
  Administered 2014-05-01: 3 ug/kg/min via INTRAVENOUS
  Filled 2014-04-30: qty 250

## 2014-04-30 MED ORDER — DEXTROSE 5 % IV SOLN
1.5000 g | INTRAVENOUS | Status: AC
  Administered 2014-05-01: 1.5 g via INTRAVENOUS
  Administered 2014-05-01: .75 g via INTRAVENOUS
  Filled 2014-04-30: qty 1.5

## 2014-04-30 MED ORDER — PAPAVERINE HCL 30 MG/ML IJ SOLN
INTRAMUSCULAR | Status: AC
  Administered 2014-05-01: 08:00:00
  Filled 2014-04-30 (×2): qty 2.5

## 2014-04-30 MED ORDER — SODIUM CHLORIDE 0.9 % IV SOLN
INTRAVENOUS | Status: AC
  Administered 2014-05-01: 69.8 mL/h via INTRAVENOUS
  Filled 2014-04-30: qty 40

## 2014-04-30 MED ORDER — CHLORHEXIDINE GLUCONATE CLOTH 2 % EX PADS
6.0000 | MEDICATED_PAD | Freq: Once | CUTANEOUS | Status: AC
  Administered 2014-04-30: 6 via TOPICAL

## 2014-04-30 MED ORDER — PHENYLEPHRINE HCL 10 MG/ML IJ SOLN
30.0000 ug/min | INTRAVENOUS | Status: AC
  Administered 2014-05-01: 20 ug/min via INTRAVENOUS
  Filled 2014-04-30: qty 2

## 2014-04-30 MED ORDER — DIAZEPAM 5 MG PO TABS
5.0000 mg | ORAL_TABLET | Freq: Once | ORAL | Status: AC
  Administered 2014-05-01: 5 mg via ORAL
  Filled 2014-04-30: qty 1

## 2014-04-30 MED ORDER — NITROGLYCERIN IN D5W 200-5 MCG/ML-% IV SOLN
2.0000 ug/min | INTRAVENOUS | Status: AC
  Administered 2014-05-01: 5 ug/min via INTRAVENOUS
  Filled 2014-04-30: qty 250

## 2014-04-30 MED ORDER — CEFUROXIME SODIUM 750 MG IJ SOLR
750.0000 mg | INTRAMUSCULAR | Status: DC
  Filled 2014-04-30: qty 750

## 2014-04-30 NOTE — Progress Notes (Signed)
Pt resting quietly in bed watching tv.  Denies pain/discomfor.  Call bell at reach.  Will continue to monitor.  Amanda Pea, Charity fundraiser.

## 2014-04-30 NOTE — Progress Notes (Addendum)
63 yo male with history of CAD, GERD, HTN, tobacco abuse, anxiety who is here today for cardiac cath. He has been followed in the past by Dr. Sharyn Lull. He was admitted to [facility] 07/29/13 with an acute anterolateral MI. Emergent cath per Dr. Sharyn Lull with 100% occlusion proximal LAD treated with DES x 1. The Circumflex had moderate disease. The RCA was occluded proximally and filled from collaterals. Echo 07/30/13 with LVEF=30-35%. No significant valve issues. He was seen in the office 04/23/14 and has had left sided chest pain/pressure for 2 weeks, worsened with exertion. Cardiac cath with 3 vessel CAD with Lt main stenosis. 70%, EF 25%. Continue hospitalization and IV heparin for CAD and Plavix wash out. Keep on tele, secondary to risk sudden cardiac death with severe 3 vessel CAD   Subjective: No complaints for CABG tomorrow   Objective: Vital signs in last 24 hours: Temp:  [98 F (36.7 C)-98.7 F (37.1 C)] 98.1 F (36.7 C) (07/15 0524) Pulse Rate:  [53-64] 53 (07/15 0524) Resp:  [18] 18 (07/15 0524) BP: (102-113)/(55-61) 102/55 mmHg (07/15 0524) SpO2:  [96 %-99 %] 99 % (07/15 0524) Weight:  [205 lb 3.2 oz (93.078 kg)] 205 lb 3.2 oz (93.078 kg) (07/15 0524) Weight change:  Last BM Date: 04/29/14 Intake/Output from previous day:-440 07/14 0701 - 07/15 0700 In: 960 [P.O.:960] Out: 1400 [Urine:1400] Intake/Output this shift:    PE: General:Pleasant affect, NAD Skin:Warm and dry, brisk capillary refill HEENT:normocephalic, sclera clear, mucus membranes moist Heart:S1S2 RRR without murmur, gallup, rub or click Lungs:clear without rales, rhonchi, or wheezes PYY:FRTM, non tender, + BS, do not palpate liver spleen or masses Ext:no lower ext edema, 2+ pedal pulses, 2+ radial pulses Neuro:alert and oriented X 3, MAE, follows commands, + facial symmetry   Lab Results:  Recent Labs  04/29/14 0500 04/30/14 0555  WBC 8.4 8.0  HGB 14.5 14.4  HCT 43.3 42.5  PLT 154 161    BMET  Recent Labs  04/29/14 0500  NA 138  K 4.2  CL 101  CO2 22  GLUCOSE 108*  BUN 17  CREATININE 0.80  CALCIUM 9.4   No results found for this basename: TROPONINI, CK, MB,  in the last 72 hours  Lab Results  Component Value Date   CHOL 170 04/29/2014   HDL 60 04/29/2014   LDLCALC 89 04/29/2014   TRIG 104 04/29/2014   CHOLHDL 2.8 04/29/2014      No results found for this basename: TSH    Hepatic Function Panel No results found for this basename: PROT, ALBUMIN, AST, ALT, ALKPHOS, BILITOT, BILIDIR, IBILI,  in the last 72 hours  Recent Labs  04/29/14 0500  CHOL 170   No results found for this basename: PROTIME,  in the last 72 hours     Studies/Results: No results found.  Medications: I have reviewed the patient's current medications. Scheduled Meds: . aspirin EC  81 mg Oral Daily  . atorvastatin  40 mg Oral q1800  . carvedilol  3.125 mg Oral BID WC  . pantoprazole  40 mg Oral Daily  . ramipril  1.25 mg Oral Daily  . spironolactone  25 mg Oral Daily   Continuous Infusions: . heparin 1,350 Units/hr (04/29/14 1529)   PRN Meds:.acetaminophen, albuterol, colchicine, nitroGLYCERIN, ondansetron (ZOFRAN) IV, ondansetron  Assessment/Plan: Assessment and Plan:  1. CAD: He is known to have a chronically occluded RCA, moderate Circumflex disease and recent DES placed proximal LAD in setting of acute MI  October 2014. Now with chest pain c/w unstable angina. Cath 04/24/14 showed severe 3 vessel ASCAD with LM stenosis confirmed by IVUS. He has severe ostial left circ and diagonal disease and severe LV dysfunction. Continue ASA, beta blocker, Ace-inh, statin. CVTS plans for surgery next Thursday. Discussed with Dr. Clifton JamesMcAlhany and feels patient needs to stay in hospital until surgery. He just had a stent placed in prox LAD less 8 months ago and needs to be off Plavix so safest option is to keep on IV Heparin until surgery to protect LAD stent. He has also been having significant  angina for the past 2 weeks. Patient is agreeable. P2Y12 is 216  2. Ischemic cardiomyopathy: [facility] and 25% by cath. He has known ischemic cardiomyopathy with large anterior MI 8 months ago. He has been on optimal medical therapy for 8 months. He will need reassessment of EF 3 months after reevaluation. Continue medical management with beta blocker, Ace-inh. Volume status is ok. Continue tele. CABG in AM 3. HTN: BP controlled.   4. GERD: Continue PPI.  5. 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  6. High risk sudden cardiac death with hx MI, EF 25%,  7. Hyperlipidemia- last lipid 07/2013 recheck, on lipitor  8. NSVT 4-5 beats , still with occ episode     LOS: 6 days   Time spent with pt. :15 minutes. [facility] RiverNGOLD,LAURA R  Nurse Practitioner Certified Pager [phone] or after 5pm and on weekends call [phone] 04/30/2014, 8:50 AM   Patient seen and examined. Agree with assessment and plan. No recurrent chest pain on iv heparin. Sinus rhythm in the 70's; no further ectopy. Plan for CABG tomorrow am.   Lennette Biharihomas A. Lavra Imler, MD, [facility]naFACC 04/30/2014 10:05 AM

## 2014-04-30 NOTE — Progress Notes (Signed)
CARE MANAGEMENT NOTE 04/30/2014  Patient:  Peter Booker, Peter Booker   Account Number:  [id number]  Date Initiated:  04/28/2014  Documentation initiated by:  GRAVES-BIGELOW,Nija Koopman  Subjective/Objective Assessment:   Pt in with cp. S/p cath revealed 3V CAD. Plan for CABG on Thursday due to Plavix washout.     Action/Plan:   CM will continue to monitor for disposition needs.   Anticipated DC Date:  05/05/2014   Anticipated DC Plan:  HOME W HOME HEALTH SERVICES      DC Planning Services  CM consult      Choice offered to / List presented to:             Status of service:  In process, will continue to follow Medicare Important Message given?  NO (If response is "NO", the following Medicare IM given date fields will be blank) Date Medicare IM given:   Medicare IM given by:   Date Additional Medicare IM given:   Additional Medicare IM given by:    Discharge Disposition:    Per UR Regulation:  Reviewed for med. necessity/level of care/duration of stay  If discussed at Long Length of Stay Meetings, dates discussed:   04/29/2014  05/01/2014    Comments:  04-30-14 1257 Tomi Bamberger, RN,BSN [phone] Pt planned for CABG May 01, 2014. CM will continue to monitor for disposition needs.

## 2014-04-30 NOTE — Progress Notes (Signed)
ANTICOAGULATION CONSULT NOTE - Follow Up Consult  Pharmacy Consult for Heparin  Indication: 3v CAD awaiting CABG  No Known Allergies  Patient Measurements: Height: 5\' 7"  (170.2 cm) Weight: 205 lb 3.2 oz (93.078 kg) IBW/kg (Calculated) : 66.1 Heparin Dosing Weight: 84 kg  Vital Signs: Temp: 98.1 F (36.7 C) (07/15 0524) Temp src: Oral (07/15 0524) BP: 102/55 mmHg (07/15 0524) Pulse Rate: 53 (07/15 0524)  Labs:  Recent Labs  04/28/14 0635 04/29/14 0500 04/30/14 0555  HGB 14.9 14.5 14.4  HCT 44.9 43.3 42.5  PLT 161 154 161  HEPARINUNFRC 0.36 0.50 0.53  CREATININE  --  0.80  --     Estimated Creatinine Clearance: 105.5 ml/min (by C-G formula based on Cr of 0.8).  Assessment: 63 y/o M with LM disease and 3V CAD continues on IV heparin and is awaiting CABG after plavix washout.  P2Y12 level is 216 as of 7/13. Heparin level remains therapeutic at 0.53 and CBC is WNL. No bleeding noted.   Goal of Therapy:  Heparin level 0.3-0.7 units/ml Monitor platelets by anticoagulation protocol: Yes   Plan:  1. Continue heparin gtt 1350 units/hr 2. F/u AM heparin level and CBC  Lysle Pearl, PharmD, BCPS Pager # [phone] 04/30/2014 8:17 AM

## 2014-04-30 NOTE — Progress Notes (Signed)
6 Days Post-Op Procedure(s) (LRB): LEFT HEART CATHETERIZATION WITH CORONARY ANGIOGRAM (N/A) Subjective: No chest pain or shortness of breath  Objective: Vital signs in last 24 hours: Temp:  [97.7 F (36.5 C)-98.1 F (36.7 C)] 97.7 F (36.5 C) (07/15 1400) Pulse Rate:  [53-68] 68 (07/15 1709) Cardiac Rhythm:  [-] Normal sinus rhythm (07/15 0857) Resp:  [18] 18 (07/15 1400) BP: (102-133)/(55-76) 133/76 mmHg (07/15 1709) SpO2:  [99 %] 99 % (07/15 1400) Weight:  [93.078 kg (205 lb 3.2 oz)] 93.078 kg (205 lb 3.2 oz) (07/15 0524)  Hemodynamic parameters for last 24 hours:    Intake/Output from previous day: 07/14 0701 - 07/15 0700 In: 960 [P.O.:960] Out: 1400 [Urine:1400] Intake/Output this shift:    General appearance: alert and cooperative Heart: regular rate and rhythm, S1, S2 normal, no murmur, click, rub or gallop Lungs: clear to auscultation bilaterally  Lab Results:  Recent Labs  04/29/14 0500 04/30/14 0555  WBC 8.4 8.0  HGB 14.5 14.4  HCT 43.3 42.5  PLT 154 161   BMET:  Recent Labs  04/29/14 0500  NA 138  K 4.2  CL 101  CO2 22  GLUCOSE 108*  BUN 17  CREATININE 0.80  CALCIUM 9.4    PT/INR: No results found for this basename: LABPROT, INR,  in the last 72 hours ABG    Component Value Date/Time   TCO2 19 07/29/2013 1332   CBG (last 3)  No results found for this basename: GLUCAP,  in the last 72 hours  Assessment/Plan: S/P Procedure(s) (LRB): LEFT HEART CATHETERIZATION WITH CORONARY ANGIOGRAM (N/A) Severe multi-vessel coronary artery disease with severe LV dysfunction Plan CABG in the am. I discussed the operative procedure with the patient including alternatives, benefits and risks; including but not limited to bleeding, blood transfusion, infection, stroke, myocardial infarction, graft failure, heart block requiring a permanent pacemaker, organ dysfunction, and death.  Peter Gulling Sr. understands and agrees to proceed.    LOS: 6 days     BARTLE,BRYAN K 04/30/2014

## 2014-05-01 ENCOUNTER — Encounter (HOSPITAL_COMMUNITY): Payer: Medicaid Other | Admitting: Anesthesiology

## 2014-05-01 ENCOUNTER — Inpatient Hospital Stay (HOSPITAL_COMMUNITY): Payer: Medicaid Other

## 2014-05-01 ENCOUNTER — Encounter (HOSPITAL_COMMUNITY): Payer: Self-pay | Admitting: Anesthesiology

## 2014-05-01 ENCOUNTER — Inpatient Hospital Stay (HOSPITAL_COMMUNITY): Payer: Medicaid Other | Admitting: Anesthesiology

## 2014-05-01 ENCOUNTER — Encounter (HOSPITAL_COMMUNITY)
Admission: RE | Disposition: A | Discharge status: Home / Self Care | Payer: No Typology Code available for payment source | Source: Ambulatory Visit | Attending: Cardiovascular Disease

## 2014-05-01 DIAGNOSIS — Z951 Presence of aortocoronary bypass graft: Secondary | ICD-10-CM

## 2014-05-01 DIAGNOSIS — I2510 Atherosclerotic heart disease of native coronary artery without angina pectoris: Secondary | ICD-10-CM

## 2014-05-01 HISTORY — PX: CORONARY ARTERY BYPASS GRAFT: SHX141

## 2014-05-01 HISTORY — PX: INTRAOPERATIVE TRANSESOPHAGEAL ECHOCARDIOGRAM: SHX5062

## 2014-05-01 LAB — POCT I-STAT, CHEM 8
BUN: 14 mg/dL (ref 6–23)
BUN: 13 mg/dL (ref 6–23)
BUN: 14 mg/dL (ref 6–23)
BUN: 13 mg/dL (ref 6–23)
BUN: 13 mg/dL (ref 6–23)
BUN: 14 mg/dL (ref 6–23)
CALCIUM ION: 1.3 mmol/L (ref 1.13–1.30)
CHLORIDE: 100 meq/L (ref 96–112)
CHLORIDE: 92 meq/L — AB (ref 96–112)
CHLORIDE: 98 meq/L (ref 96–112)
CREATININE: 0.7 mg/dL (ref 0.50–1.35)
CREATININE: 1 mg/dL (ref 0.50–1.35)
Calcium, Ion: 1.34 mmol/L — ABNORMAL HIGH (ref 1.13–1.30)
Calcium, Ion: 1.04 mmol/L — ABNORMAL LOW (ref 1.13–1.30)
Calcium, Ion: 1.08 mmol/L — ABNORMAL LOW (ref 1.13–1.30)
Calcium, Ion: 1.11 mmol/L — ABNORMAL LOW (ref 1.13–1.30)
Calcium, Ion: 1.21 mmol/L (ref 1.13–1.30)
Chloride: 99 mEq/L (ref 96–112)
Chloride: 93 mEq/L — ABNORMAL LOW (ref 96–112)
Chloride: 104 mEq/L (ref 96–112)
Creatinine, Ser: 0.7 mg/dL (ref 0.50–1.35)
Creatinine, Ser: 0.7 mg/dL (ref 0.50–1.35)
Creatinine, Ser: 0.8 mg/dL (ref 0.50–1.35)
Creatinine, Ser: 0.8 mg/dL (ref 0.50–1.35)
GLUCOSE: 141 mg/dL — AB (ref 70–99)
Glucose, Bld: 111 mg/dL — ABNORMAL HIGH (ref 70–99)
Glucose, Bld: 110 mg/dL — ABNORMAL HIGH (ref 70–99)
Glucose, Bld: 95 mg/dL (ref 70–99)
Glucose, Bld: 122 mg/dL — ABNORMAL HIGH (ref 70–99)
Glucose, Bld: 133 mg/dL — ABNORMAL HIGH (ref 70–99)
HCT: 45 % (ref 39.0–52.0)
HCT: 28 % — ABNORMAL LOW (ref 39.0–52.0)
HCT: 31 % — ABNORMAL LOW (ref 39.0–52.0)
HCT: 30 % — ABNORMAL LOW (ref 39.0–52.0)
HCT: 45 % (ref 39.0–52.0)
HEMATOCRIT: 42 % (ref 39.0–52.0)
HEMOGLOBIN: 15.3 g/dL (ref 13.0–17.0)
Hemoglobin: 15.3 g/dL (ref 13.0–17.0)
Hemoglobin: 14.3 g/dL (ref 13.0–17.0)
Hemoglobin: 9.5 g/dL — ABNORMAL LOW (ref 13.0–17.0)
Hemoglobin: 10.5 g/dL — ABNORMAL LOW (ref 13.0–17.0)
Hemoglobin: 10.2 g/dL — ABNORMAL LOW (ref 13.0–17.0)
POTASSIUM: 4.3 meq/L (ref 3.7–5.3)
Potassium: 4.6 mEq/L (ref 3.7–5.3)
Potassium: 4.6 mEq/L (ref 3.7–5.3)
Potassium: 4.9 mEq/L (ref 3.7–5.3)
Potassium: 4.9 mEq/L (ref 3.7–5.3)
Potassium: 4.3 mEq/L (ref 3.7–5.3)
SODIUM: 138 meq/L (ref 137–147)
SODIUM: 135 meq/L — AB (ref 137–147)
SODIUM: 138 meq/L (ref 137–147)
Sodium: 132 mEq/L — ABNORMAL LOW (ref 137–147)
Sodium: 138 mEq/L (ref 137–147)
Sodium: 131 mEq/L — ABNORMAL LOW (ref 137–147)
TCO2: 28 mmol/L (ref 0–100)
TCO2: 26 mmol/L (ref 0–100)
TCO2: 28 mmol/L (ref 0–100)
TCO2: 25 mmol/L (ref 0–100)
TCO2: 25 mmol/L (ref 0–100)
TCO2: 21 mmol/L (ref 0–100)

## 2014-05-01 LAB — POCT I-STAT 3, ART BLOOD GAS (G3+)
ACID-BASE DEFICIT: 2 mmol/L (ref 0.0–2.0)
ACID-BASE DEFICIT: 2 mmol/L (ref 0.0–2.0)
Acid-Base Excess: 3 mmol/L — ABNORMAL HIGH (ref 0.0–2.0)
Acid-base deficit: 1 mmol/L (ref 0.0–2.0)
Bicarbonate: 28.5 mEq/L — ABNORMAL HIGH (ref 20.0–24.0)
Bicarbonate: 25.2 mEq/L — ABNORMAL HIGH (ref 20.0–24.0)
Bicarbonate: 23.7 mEq/L (ref 20.0–24.0)
Bicarbonate: 22.7 mEq/L (ref 20.0–24.0)
O2 SAT: 95 %
O2 SAT: 95 %
O2 Saturation: 100 %
O2 Saturation: 97 %
PCO2 ART: 39.2 mmHg (ref 35.0–45.0)
PH ART: 7.387 (ref 7.350–7.450)
PO2 ART: 80 mmHg (ref 80.0–100.0)
Patient temperature: 38
TCO2: 30 mmol/L (ref 0–100)
TCO2: 27 mmol/L (ref 0–100)
TCO2: 25 mmol/L (ref 0–100)
TCO2: 24 mmol/L (ref 0–100)
pCO2 arterial: 47.4 mmHg — ABNORMAL HIGH (ref 35.0–45.0)
pCO2 arterial: 45.1 mmHg — ABNORMAL HIGH (ref 35.0–45.0)
pCO2 arterial: 43.3 mmHg (ref 35.0–45.0)
pH, Arterial: 7.353 (ref 7.350–7.450)
pH, Arterial: 7.349 — ABNORMAL LOW (ref 7.350–7.450)
pH, Arterial: 7.375 (ref 7.350–7.450)
pO2, Arterial: 329 mmHg — ABNORMAL HIGH (ref 80.0–100.0)
pO2, Arterial: 98 mmHg (ref 80.0–100.0)
pO2, Arterial: 81 mmHg (ref 80.0–100.0)

## 2014-05-01 LAB — CBC
HCT: 38.9 % — ABNORMAL LOW (ref 39.0–52.0)
HCT: 38.9 % — ABNORMAL LOW (ref 39.0–52.0)
Hemoglobin: 13.1 g/dL (ref 13.0–17.0)
Hemoglobin: 13.4 g/dL (ref 13.0–17.0)
MCH: 32 pg (ref 26.0–34.0)
MCH: 32.8 pg (ref 26.0–34.0)
MCHC: 33.7 g/dL (ref 30.0–36.0)
MCHC: 34.4 g/dL (ref 30.0–36.0)
MCV: 95.1 fL (ref 78.0–100.0)
MCV: 95.1 fL (ref 78.0–100.0)
PLATELETS: 133 10*3/uL — AB (ref 150–400)
PLATELETS: 164 10*3/uL (ref 150–400)
RBC: 4.09 MIL/uL — ABNORMAL LOW (ref 4.22–5.81)
RBC: 4.09 MIL/uL — AB (ref 4.22–5.81)
RDW: 13.7 % (ref 11.5–15.5)
RDW: 13.7 % (ref 11.5–15.5)
WBC: 13.7 10*3/uL — AB (ref 4.0–10.5)
WBC: 15.3 10*3/uL — AB (ref 4.0–10.5)

## 2014-05-01 LAB — BLOOD GAS, ARTERIAL
Acid-Base Excess: 1.3 mmol/L (ref 0.0–2.0)
Bicarbonate: 25.5 mEq/L — ABNORMAL HIGH (ref 20.0–24.0)
Drawn by: 39898
FIO2: 0.21 %
O2 Saturation: 95.2 %
PATIENT TEMPERATURE: 98.6
TCO2: 26.8 mmol/L (ref 0–100)
pCO2 arterial: 41.6 mmHg (ref 35.0–45.0)
pH, Arterial: 7.405 (ref 7.350–7.450)
pO2, Arterial: 80.7 mmHg (ref 80.0–100.0)

## 2014-05-01 LAB — CREATININE, SERUM
CREATININE: 0.96 mg/dL (ref 0.50–1.35)
GFR calc non Af Amer: 88 mL/min — ABNORMAL LOW (ref >90)

## 2014-05-01 LAB — PROTIME-INR
INR: 0.98 (ref 0.00–1.49)
INR: 1.21 (ref 0.00–1.49)
PROTHROMBIN TIME: 13 s (ref 11.6–15.2)
Prothrombin Time: 15.3 seconds — ABNORMAL HIGH (ref 11.6–15.2)

## 2014-05-01 LAB — POCT I-STAT 4, (NA,K, GLUC, HGB,HCT)
Glucose, Bld: 141 mg/dL — ABNORMAL HIGH (ref 70–99)
HCT: 40 % (ref 39.0–52.0)
HEMOGLOBIN: 13.6 g/dL (ref 13.0–17.0)
POTASSIUM: 4.3 meq/L (ref 3.7–5.3)
SODIUM: 134 meq/L — AB (ref 137–147)

## 2014-05-01 LAB — POCT I-STAT GLUCOSE
GLUCOSE: 111 mg/dL — AB (ref 70–99)
OPERATOR ID: 284731

## 2014-05-01 LAB — APTT: APTT: 30 s (ref 24–37)

## 2014-05-01 LAB — MAGNESIUM: MAGNESIUM: 3.4 mg/dL — AB (ref 1.5–2.5)

## 2014-05-01 LAB — HEMOGLOBIN AND HEMATOCRIT, BLOOD
HCT: 29.8 % — ABNORMAL LOW (ref 39.0–52.0)
Hemoglobin: 10 g/dL — ABNORMAL LOW (ref 13.0–17.0)

## 2014-05-01 LAB — GLUCOSE, CAPILLARY: GLUCOSE-CAPILLARY: 144 mg/dL — AB (ref 70–99)

## 2014-05-01 LAB — PLATELET COUNT: PLATELETS: 97 10*3/uL — AB (ref 150–400)

## 2014-05-01 LAB — ABO/RH: ABO/RH(D): O POS

## 2014-05-01 SURGERY — CORONARY ARTERY BYPASS GRAFTING (CABG)
Anesthesia: General | Site: Chest

## 2014-05-01 MED ORDER — INSULIN REGULAR BOLUS VIA INFUSION
0.0000 [IU] | Freq: Three times a day (TID) | INTRAVENOUS | Status: DC
  Filled 2014-05-01: qty 10

## 2014-05-01 MED ORDER — ROCURONIUM BROMIDE 50 MG/5ML IV SOLN
INTRAVENOUS | Status: AC
  Filled 2014-05-01: qty 2

## 2014-05-01 MED ORDER — SODIUM CHLORIDE 0.9 % IV SOLN
INTRAVENOUS | Status: DC
  Administered 2014-05-01: 8.5 [IU]/h via INTRAVENOUS
  Filled 2014-05-01 (×2): qty 1

## 2014-05-01 MED ORDER — THROMBIN 20000 UNITS EX SOLR
CUTANEOUS | Status: AC
  Filled 2014-05-01: qty 20000

## 2014-05-01 MED ORDER — MIDAZOLAM HCL 5 MG/5ML IJ SOLN
INTRAMUSCULAR | Status: DC | PRN
  Administered 2014-05-01 (×3): 2 mg via INTRAVENOUS
  Administered 2014-05-01: 3 mg via INTRAVENOUS
  Administered 2014-05-01: 2 mg via INTRAVENOUS
  Administered 2014-05-01: 3 mg via INTRAVENOUS

## 2014-05-01 MED ORDER — PROTAMINE SULFATE 10 MG/ML IV SOLN
INTRAVENOUS | Status: AC
  Filled 2014-05-01: qty 5

## 2014-05-01 MED ORDER — MIDAZOLAM HCL 2 MG/2ML IJ SOLN
INTRAMUSCULAR | Status: AC
  Filled 2014-05-01: qty 2

## 2014-05-01 MED ORDER — THROMBIN 20000 UNITS EX SOLR
OROMUCOSAL | Status: DC | PRN
  Administered 2014-05-01: 08:00:00 via TOPICAL

## 2014-05-01 MED ORDER — PROPOFOL 10 MG/ML IV BOLUS
INTRAVENOUS | Status: DC | PRN
  Administered 2014-05-01: 80 mg via INTRAVENOUS
  Administered 2014-05-01: 30 mg via INTRAVENOUS
  Administered 2014-05-01: 20 mg via INTRAVENOUS

## 2014-05-01 MED ORDER — FENTANYL CITRATE 0.05 MG/ML IJ SOLN
INTRAMUSCULAR | Status: AC
  Filled 2014-05-01: qty 5

## 2014-05-01 MED ORDER — DOPAMINE-DEXTROSE 3.2-5 MG/ML-% IV SOLN: 0.0000 ug/kg/min | INTRAVENOUS | Status: DC

## 2014-05-01 MED ORDER — HEPARIN SODIUM (PORCINE) 1000 UNIT/ML IJ SOLN
INTRAMUSCULAR | Status: AC
  Filled 2014-05-01: qty 1

## 2014-05-01 MED ORDER — SODIUM CHLORIDE 0.9 % IV SOLN: INTRAVENOUS | Status: DC

## 2014-05-01 MED ORDER — BISACODYL 5 MG PO TBEC
10.0000 mg | DELAYED_RELEASE_TABLET | Freq: Every day | ORAL | Status: DC
  Administered 2014-05-02 – 2014-05-03 (×2): 10 mg via ORAL
  Filled 2014-05-01 (×2): qty 2

## 2014-05-01 MED ORDER — 0.9 % SODIUM CHLORIDE (POUR BTL) OPTIME
TOPICAL | Status: DC | PRN
  Administered 2014-05-01: 1000 mL

## 2014-05-01 MED ORDER — ACETAMINOPHEN 160 MG/5ML PO SOLN: 650.0000 mg | Freq: Once | ORAL | Status: AC

## 2014-05-01 MED ORDER — SODIUM CHLORIDE 0.9 % IV SOLN
INTRAVENOUS | Status: DC | PRN
  Administered 2014-05-01: 12:00:00 via INTRAVENOUS

## 2014-05-01 MED ORDER — ROCURONIUM BROMIDE 50 MG/5ML IV SOLN
INTRAVENOUS | Status: AC
  Filled 2014-05-01: qty 1

## 2014-05-01 MED ORDER — ACETAMINOPHEN 500 MG PO TABS
1000.0000 mg | ORAL_TABLET | Freq: Four times a day (QID) | ORAL | Status: DC
  Administered 2014-05-02 – 2014-05-03 (×4): 1000 mg via ORAL
  Filled 2014-05-01 (×8): qty 2

## 2014-05-01 MED ORDER — METOPROLOL TARTRATE 25 MG/10 ML ORAL SUSPENSION
12.5000 mg | Freq: Two times a day (BID) | ORAL | Status: DC
  Filled 2014-05-01 (×5): qty 5

## 2014-05-01 MED ORDER — OXYCODONE HCL 5 MG PO TABS
5.0000 mg | ORAL_TABLET | ORAL | Status: DC | PRN
  Administered 2014-05-02 – 2014-05-03 (×5): 10 mg via ORAL
  Filled 2014-05-01 (×5): qty 2

## 2014-05-01 MED ORDER — ACETAMINOPHEN 160 MG/5ML PO SOLN
1000.0000 mg | Freq: Four times a day (QID) | ORAL | Status: DC
  Filled 2014-05-01: qty 40

## 2014-05-01 MED ORDER — HEPARIN SODIUM (PORCINE) 1000 UNIT/ML IJ SOLN
INTRAMUSCULAR | Status: DC | PRN
  Administered 2014-05-01: 40000 [IU] via INTRAVENOUS

## 2014-05-01 MED ORDER — LACTATED RINGERS IV SOLN: INTRAVENOUS | Status: DC

## 2014-05-01 MED ORDER — ASPIRIN 81 MG PO CHEW: 324.0000 mg | CHEWABLE_TABLET | Freq: Every day | ORAL | Status: DC

## 2014-05-01 MED ORDER — MIDAZOLAM HCL 2 MG/2ML IJ SOLN
2.0000 mg | INTRAMUSCULAR | Status: DC | PRN
  Administered 2014-05-01: 2 mg via INTRAVENOUS
  Filled 2014-05-01: qty 2

## 2014-05-01 MED ORDER — BISACODYL 10 MG RE SUPP: 10.0000 mg | Freq: Every day | RECTAL | Status: DC

## 2014-05-01 MED ORDER — ROCURONIUM BROMIDE 100 MG/10ML IV SOLN
INTRAVENOUS | Status: DC | PRN
  Administered 2014-05-01 (×2): 50 mg via INTRAVENOUS
  Administered 2014-05-01: 100 mg via INTRAVENOUS
  Administered 2014-05-01: 20 mg via INTRAVENOUS

## 2014-05-01 MED ORDER — FENTANYL CITRATE 0.05 MG/ML IJ SOLN
INTRAMUSCULAR | Status: DC | PRN
  Administered 2014-05-01: 100 ug via INTRAVENOUS
  Administered 2014-05-01: 250 ug via INTRAVENOUS
  Administered 2014-05-01: 150 ug via INTRAVENOUS
  Administered 2014-05-01: 500 ug via INTRAVENOUS
  Administered 2014-05-01: 100 ug via INTRAVENOUS

## 2014-05-01 MED ORDER — MAGNESIUM SULFATE 4000MG/100ML IJ SOLN
4.0000 g | Freq: Once | INTRAMUSCULAR | Status: AC
  Administered 2014-05-01: 4 g via INTRAVENOUS
  Filled 2014-05-01: qty 100

## 2014-05-01 MED ORDER — SODIUM CHLORIDE 0.9 % IJ SOLN
3.0000 mL | Freq: Two times a day (BID) | INTRAMUSCULAR | Status: DC
  Administered 2014-05-02 – 2014-05-03 (×3): 3 mL via INTRAVENOUS

## 2014-05-01 MED ORDER — ALBUMIN HUMAN 5 % IV SOLN
250.0000 mL | INTRAVENOUS | Status: AC | PRN
  Administered 2014-05-01: 250 mL via INTRAVENOUS

## 2014-05-01 MED ORDER — MILRINONE IN DEXTROSE 20 MG/100ML IV SOLN
0.1250 ug/kg/min | INTRAVENOUS | Status: DC
  Filled 2014-05-01: qty 100

## 2014-05-01 MED ORDER — ONDANSETRON HCL 4 MG/2ML IJ SOLN
4.0000 mg | Freq: Four times a day (QID) | INTRAMUSCULAR | Status: DC | PRN
  Administered 2014-05-02 – 2014-05-03 (×3): 4 mg via INTRAVENOUS
  Filled 2014-05-01 (×3): qty 2

## 2014-05-01 MED ORDER — DEXTROSE 5 % IV SOLN
1.5000 g | Freq: Two times a day (BID) | INTRAVENOUS | Status: AC
  Administered 2014-05-01 – 2014-05-03 (×4): 1.5 g via INTRAVENOUS
  Filled 2014-05-01 (×4): qty 1.5

## 2014-05-01 MED ORDER — ARTIFICIAL TEARS OP OINT
TOPICAL_OINTMENT | OPHTHALMIC | Status: AC
  Filled 2014-05-01: qty 3.5

## 2014-05-01 MED ORDER — PROTAMINE SULFATE 10 MG/ML IV SOLN
INTRAVENOUS | Status: AC
  Filled 2014-05-01: qty 25

## 2014-05-01 MED ORDER — SODIUM CHLORIDE 0.9 % IJ SOLN
INTRAMUSCULAR | Status: AC
  Filled 2014-05-01: qty 20

## 2014-05-01 MED ORDER — SUCCINYLCHOLINE CHLORIDE 20 MG/ML IJ SOLN
INTRAMUSCULAR | Status: AC
  Filled 2014-05-01: qty 1

## 2014-05-01 MED ORDER — ACETAMINOPHEN 650 MG RE SUPP
650.0000 mg | Freq: Once | RECTAL | Status: AC
  Administered 2014-05-01: 650 mg via RECTAL

## 2014-05-01 MED ORDER — METOPROLOL TARTRATE 12.5 MG HALF TABLET
12.5000 mg | ORAL_TABLET | Freq: Two times a day (BID) | ORAL | Status: DC
  Filled 2014-05-01 (×5): qty 1

## 2014-05-01 MED ORDER — MILRINONE IN DEXTROSE 20 MG/100ML IV SOLN
INTRAVENOUS | Status: DC | PRN
  Administered 2014-05-01: 0.375 ug/kg/min via INTRAVENOUS

## 2014-05-01 MED ORDER — HEMOSTATIC AGENTS (NO CHARGE) OPTIME
TOPICAL | Status: DC | PRN
  Administered 2014-05-01: 1 via TOPICAL

## 2014-05-01 MED ORDER — MIDAZOLAM HCL 10 MG/2ML IJ SOLN
INTRAMUSCULAR | Status: AC
  Filled 2014-05-01: qty 2

## 2014-05-01 MED ORDER — DEXTROSE 5 % IV SOLN
0.0000 ug/min | INTRAVENOUS | Status: DC
  Administered 2014-05-01: 40 ug/min via INTRAVENOUS
  Filled 2014-05-01 (×3): qty 2

## 2014-05-01 MED ORDER — MORPHINE SULFATE 2 MG/ML IJ SOLN
2.0000 mg | INTRAMUSCULAR | Status: DC | PRN
  Administered 2014-05-01 – 2014-05-02 (×6): 4 mg via INTRAVENOUS
  Filled 2014-05-01 (×6): qty 2
  Filled 2014-05-01: qty 1
  Filled 2014-05-01 (×2): qty 2

## 2014-05-01 MED ORDER — MILRINONE IN DEXTROSE 20 MG/100ML IV SOLN
0.3000 ug/kg/min | INTRAVENOUS | Status: DC
  Administered 2014-05-01 – 2014-05-02 (×2): 0.3 ug/kg/min via INTRAVENOUS
  Filled 2014-05-01 (×2): qty 100

## 2014-05-01 MED ORDER — ARTIFICIAL TEARS OP OINT
TOPICAL_OINTMENT | OPHTHALMIC | Status: DC | PRN
  Administered 2014-05-01: 1 via OPHTHALMIC

## 2014-05-01 MED ORDER — DOCUSATE SODIUM 100 MG PO CAPS
200.0000 mg | ORAL_CAPSULE | Freq: Every day | ORAL | Status: DC
  Administered 2014-05-02 – 2014-05-03 (×2): 200 mg via ORAL
  Filled 2014-05-01 (×2): qty 2

## 2014-05-01 MED ORDER — FAMOTIDINE IN NACL 20-0.9 MG/50ML-% IV SOLN
20.0000 mg | Freq: Two times a day (BID) | INTRAVENOUS | Status: AC
  Administered 2014-05-01: 20 mg via INTRAVENOUS

## 2014-05-01 MED ORDER — LACTATED RINGERS IV SOLN
INTRAVENOUS | Status: DC | PRN
  Administered 2014-05-01 (×4): via INTRAVENOUS

## 2014-05-01 MED ORDER — MORPHINE SULFATE 2 MG/ML IJ SOLN
1.0000 mg | INTRAMUSCULAR | Status: AC | PRN
  Administered 2014-05-01 (×2): 4 mg via INTRAVENOUS

## 2014-05-01 MED ORDER — NITROGLYCERIN IN D5W 200-5 MCG/ML-% IV SOLN
0.0000 ug/min | INTRAVENOUS | Status: DC
Start: 2014-05-01 — End: 2014-05-03

## 2014-05-01 MED ORDER — POTASSIUM CHLORIDE 10 MEQ/50ML IV SOLN: 10.0000 meq | INTRAVENOUS | Status: DC

## 2014-05-01 MED ORDER — PANTOPRAZOLE SODIUM 40 MG PO TBEC
40.0000 mg | DELAYED_RELEASE_TABLET | Freq: Every day | ORAL | Status: DC
  Administered 2014-05-03: 40 mg via ORAL
  Filled 2014-05-01: qty 1

## 2014-05-01 MED ORDER — LACTATED RINGERS IV SOLN: 500.0000 mL | Freq: Once | INTRAVENOUS | Status: AC | PRN

## 2014-05-01 MED ORDER — ASPIRIN EC 325 MG PO TBEC
325.0000 mg | DELAYED_RELEASE_TABLET | Freq: Every day | ORAL | Status: DC
  Administered 2014-05-02 – 2014-05-03 (×2): 325 mg via ORAL
  Filled 2014-05-01 (×2): qty 1

## 2014-05-01 MED ORDER — EPHEDRINE SULFATE 50 MG/ML IJ SOLN
INTRAMUSCULAR | Status: AC
  Filled 2014-05-01: qty 1

## 2014-05-01 MED ORDER — METOPROLOL TARTRATE 1 MG/ML IV SOLN
2.5000 mg | INTRAVENOUS | Status: DC | PRN
Start: 2014-05-01 — End: 2014-05-03

## 2014-05-01 MED ORDER — PROPOFOL 10 MG/ML IV BOLUS
INTRAVENOUS | Status: AC
  Filled 2014-05-01: qty 20

## 2014-05-01 MED ORDER — FENTANYL CITRATE 0.05 MG/ML IJ SOLN
INTRAMUSCULAR | Status: AC
  Filled 2014-05-01: qty 2

## 2014-05-01 MED ORDER — SODIUM CHLORIDE 0.9 % IJ SOLN: 3.0000 mL | INTRAMUSCULAR | Status: DC | PRN

## 2014-05-01 MED ORDER — PROTAMINE SULFATE 10 MG/ML IV SOLN
INTRAVENOUS | Status: DC | PRN
  Administered 2014-05-01 (×6): 50 mg via INTRAVENOUS

## 2014-05-01 MED ORDER — SODIUM CHLORIDE 0.9 % IV SOLN
250.0000 mL | INTRAVENOUS | Status: DC
  Administered 2014-05-02: 250 mL via INTRAVENOUS

## 2014-05-01 MED ORDER — DEXMEDETOMIDINE HCL IN NACL 200 MCG/50ML IV SOLN: 0.1000 ug/kg/h | INTRAVENOUS | Status: DC

## 2014-05-01 MED ORDER — VANCOMYCIN HCL IN DEXTROSE 1-5 GM/200ML-% IV SOLN
1000.0000 mg | Freq: Once | INTRAVENOUS | Status: AC
  Administered 2014-05-01: 1000 mg via INTRAVENOUS
  Filled 2014-05-01: qty 200

## 2014-05-01 MED ORDER — CALCIUM CHLORIDE 10 % IV SOLN
INTRAVENOUS | Status: DC | PRN
  Administered 2014-05-01: 100 mg via INTRAVENOUS

## 2014-05-01 MED ORDER — SODIUM CHLORIDE 0.45 % IV SOLN: INTRAVENOUS | Status: DC

## 2014-05-01 SURGICAL SUPPLY — 106 items
ATTRACTOMAT 16X20 MAGNETIC DRP (DRAPES) ×4 IMPLANT
BAG DECANTER FOR FLEXI CONT (MISCELLANEOUS) ×4 IMPLANT
BANDAGE ELASTIC 4 VELCRO ST LF (GAUZE/BANDAGES/DRESSINGS) ×4 IMPLANT
BANDAGE ELASTIC 6 VELCRO ST LF (GAUZE/BANDAGES/DRESSINGS) ×4 IMPLANT
BANDAGE GAUZE ELAST BULKY 4 IN (GAUZE/BANDAGES/DRESSINGS) ×4 IMPLANT
BASKET HEART  (ORDER IN 25'S) (MISCELLANEOUS) ×1
BASKET HEART (ORDER IN 25'S) (MISCELLANEOUS) ×1
BASKET HEART (ORDER IN 25S) (MISCELLANEOUS) ×2 IMPLANT
BLADE STERNUM SYSTEM 6 (BLADE) ×4 IMPLANT
BNDG GAUZE ELAST 4 BULKY (GAUZE/BANDAGES/DRESSINGS) ×2 IMPLANT
CANISTER SUCTION 2500CC (MISCELLANEOUS) ×4 IMPLANT
CANNULA VESSEL 3MM BLUNT TIP (CANNULA) ×6 IMPLANT
CARDIAC SUCTION (MISCELLANEOUS) ×4 IMPLANT
CATH ROBINSON RED A/P 18FR (CATHETERS) ×8 IMPLANT
CATH THORACIC 28FR (CATHETERS) ×4 IMPLANT
CATH THORACIC 36FR (CATHETERS) ×4 IMPLANT
CATH THORACIC 36FR RT ANG (CATHETERS) ×4 IMPLANT
CLIP TI MEDIUM 24 (CLIP) IMPLANT
CLIP TI WIDE RED SMALL 24 (CLIP) ×2 IMPLANT
COVER SURGICAL LIGHT HANDLE (MISCELLANEOUS) ×6 IMPLANT
CRADLE DONUT ADULT HEAD (MISCELLANEOUS) ×4 IMPLANT
DRAPE CARDIOVASCULAR INCISE (DRAPES) ×4
DRAPE SLUSH/WARMER DISC (DRAPES) ×4 IMPLANT
DRAPE SRG 135X102X78XABS (DRAPES) ×2 IMPLANT
DRSG COVADERM 4X14 (GAUZE/BANDAGES/DRESSINGS) ×4 IMPLANT
ELECT CAUTERY BLADE 6.4 (BLADE) ×4 IMPLANT
ELECT REM PT RETURN 9FT ADLT (ELECTROSURGICAL) ×8
ELECTRODE REM PT RTRN 9FT ADLT (ELECTROSURGICAL) ×4 IMPLANT
GLOVE BIO SURGEON STRL SZ 6 (GLOVE) IMPLANT
GLOVE BIO SURGEON STRL SZ 6.5 (GLOVE) IMPLANT
GLOVE BIO SURGEON STRL SZ7 (GLOVE) IMPLANT
GLOVE BIO SURGEON STRL SZ7.5 (GLOVE) IMPLANT
GLOVE BIO SURGEONS STRL SZ 6.5 (GLOVE)
GLOVE BIOGEL M 6.5 STRL (GLOVE) ×12 IMPLANT
GLOVE BIOGEL M STRL SZ7.5 (GLOVE) ×6 IMPLANT
GLOVE BIOGEL PI IND STRL 6 (GLOVE) IMPLANT
GLOVE BIOGEL PI IND STRL 6.5 (GLOVE) IMPLANT
GLOVE BIOGEL PI IND STRL 7.0 (GLOVE) IMPLANT
GLOVE BIOGEL PI IND STRL 7.5 (GLOVE) IMPLANT
GLOVE BIOGEL PI INDICATOR 6 (GLOVE) ×2
GLOVE BIOGEL PI INDICATOR 6.5 (GLOVE)
GLOVE BIOGEL PI INDICATOR 7.0 (GLOVE) ×6
GLOVE BIOGEL PI INDICATOR 7.5 (GLOVE) ×2
GLOVE EUDERMIC 7 POWDERFREE (GLOVE) ×8 IMPLANT
GLOVE ORTHO TXT STRL SZ7.5 (GLOVE) IMPLANT
GOWN STRL REUS W/ TWL LRG LVL3 (GOWN DISPOSABLE) ×8 IMPLANT
GOWN STRL REUS W/ TWL XL LVL3 (GOWN DISPOSABLE) ×2 IMPLANT
GOWN STRL REUS W/TWL LRG LVL3 (GOWN DISPOSABLE) ×28
GOWN STRL REUS W/TWL XL LVL3 (GOWN DISPOSABLE) ×4
HEMOSTAT POWDER SURGIFOAM 1G (HEMOSTASIS) ×12 IMPLANT
HEMOSTAT SURGICEL 2X14 (HEMOSTASIS) ×4 IMPLANT
INSERT FOGARTY 61MM (MISCELLANEOUS) IMPLANT
INSERT FOGARTY XLG (MISCELLANEOUS) IMPLANT
KIT BASIN OR (CUSTOM PROCEDURE TRAY) ×4 IMPLANT
KIT CATH CPB BARTLE (MISCELLANEOUS) ×4 IMPLANT
KIT ROOM TURNOVER OR (KITS) ×4 IMPLANT
KIT SUCTION CATH 14FR (SUCTIONS) ×4 IMPLANT
KIT VASOVIEW W/TROCAR VH 2000 (KITS) ×4 IMPLANT
NS IRRIG 1000ML POUR BTL (IV SOLUTION) ×20 IMPLANT
PACK OPEN HEART (CUSTOM PROCEDURE TRAY) ×4 IMPLANT
PAD ARMBOARD 7.5X6 YLW CONV (MISCELLANEOUS) ×8 IMPLANT
PAD ELECT DEFIB RADIOL ZOLL (MISCELLANEOUS) ×4 IMPLANT
PENCIL BUTTON HOLSTER BLD 10FT (ELECTRODE) ×4 IMPLANT
PUNCH AORTIC ROTATE 4.0MM (MISCELLANEOUS) IMPLANT
PUNCH AORTIC ROTATE 4.5MM 8IN (MISCELLANEOUS) ×4 IMPLANT
PUNCH AORTIC ROTATE 5MM 8IN (MISCELLANEOUS) IMPLANT
SET CARDIOPLEGIA MPS 5001102 (MISCELLANEOUS) ×2 IMPLANT
SPONGE GAUZE 4X4 12PLY (GAUZE/BANDAGES/DRESSINGS) ×8 IMPLANT
SPONGE GAUZE 4X4 12PLY STER LF (GAUZE/BANDAGES/DRESSINGS) ×4 IMPLANT
SPONGE INTESTINAL PEANUT (DISPOSABLE) IMPLANT
SPONGE LAP 18X18 X RAY DECT (DISPOSABLE) IMPLANT
SPONGE LAP 4X18 X RAY DECT (DISPOSABLE) ×4 IMPLANT
SUT BONE WAX W31G (SUTURE) ×4 IMPLANT
SUT MNCRL AB 4-0 PS2 18 (SUTURE) IMPLANT
SUT PROLENE 3 0 SH DA (SUTURE) IMPLANT
SUT PROLENE 3 0 SH1 36 (SUTURE) ×4 IMPLANT
SUT PROLENE 4 0 RB 1 (SUTURE)
SUT PROLENE 4 0 SH DA (SUTURE) IMPLANT
SUT PROLENE 4-0 RB1 .5 CRCL 36 (SUTURE) IMPLANT
SUT PROLENE 5 0 C 1 36 (SUTURE) IMPLANT
SUT PROLENE 6 0 C 1 30 (SUTURE) ×6 IMPLANT
SUT PROLENE 7 0 BV 1 (SUTURE) IMPLANT
SUT PROLENE 7 0 BV1 MDA (SUTURE) ×6 IMPLANT
SUT PROLENE 8 0 BV175 6 (SUTURE) IMPLANT
SUT SILK  1 MH (SUTURE)
SUT SILK 1 MH (SUTURE) IMPLANT
SUT STEEL STERNAL CCS#1 18IN (SUTURE) IMPLANT
SUT STEEL SZ 6 DBL 3X14 BALL (SUTURE) IMPLANT
SUT VIC AB 1 CTX 36 (SUTURE) ×8
SUT VIC AB 1 CTX36XBRD ANBCTR (SUTURE) ×4 IMPLANT
SUT VIC AB 2-0 CT1 27 (SUTURE) ×4
SUT VIC AB 2-0 CT1 TAPERPNT 27 (SUTURE) IMPLANT
SUT VIC AB 2-0 CTX 27 (SUTURE) IMPLANT
SUT VIC AB 3-0 SH 27 (SUTURE)
SUT VIC AB 3-0 SH 27X BRD (SUTURE) IMPLANT
SUT VIC AB 3-0 X1 27 (SUTURE) ×2 IMPLANT
SUT VICRYL 4-0 PS2 18IN ABS (SUTURE) IMPLANT
SUTURE E-PAK OPEN HEART (SUTURE) ×4 IMPLANT
SYSTEM SAHARA CHEST DRAIN ATS (WOUND CARE) ×4 IMPLANT
TAPE CLOTH SURG 4X10 WHT LF (GAUZE/BANDAGES/DRESSINGS) ×4 IMPLANT
TOWEL OR 17X24 6PK STRL BLUE (TOWEL DISPOSABLE) ×4 IMPLANT
TOWEL OR 17X26 10 PK STRL BLUE (TOWEL DISPOSABLE) ×4 IMPLANT
TRAY FOLEY IC TEMP SENS 16FR (CATHETERS) ×4 IMPLANT
TUBING INSUFFLATION 10FT LAP (TUBING) ×4 IMPLANT
UNDERPAD 30X30 INCONTINENT (UNDERPADS AND DIAPERS) ×4 IMPLANT
WATER STERILE IRR 1000ML POUR (IV SOLUTION) ×8 IMPLANT

## 2014-05-01 NOTE — Anesthesia Preprocedure Evaluation (Addendum)
Anesthesia Evaluation  Patient identified by MRN, date of birth, ID band Patient awake    Reviewed: Allergy & Precautions, H&P , NPO status , Patient's Chart, lab work & pertinent test results  Airway Mallampati: II TM Distance: >3 FB Neck ROM: Full    Dental  (+) Edentulous Upper, Edentulous Lower, Dental Advisory Given   Pulmonary Current Smoker,          Cardiovascular hypertension, + angina + CAD, + Past MI and + Cardiac Stents + Valvular Problems/Murmurs MR Rhythm:Regular Rate:Normal     Neuro/Psych    GI/Hepatic GERD-  ,  Endo/Other  Morbid obesity  Renal/GU      Musculoskeletal   Abdominal   Peds  Hematology   Anesthesia Other Findings   Reproductive/Obstetrics                         Anesthesia Physical Anesthesia Plan  ASA: IV  Anesthesia Plan: General   Post-op Pain Management:    Induction: Intravenous  Airway Management Planned: Oral ETT  Additional Equipment: Arterial line, CVP, PA Cath and TEE  Intra-op Plan:   Post-operative Plan: Post-operative intubation/ventilation  Informed Consent: I have reviewed the patients History and Physical, chart, labs and discussed the procedure including the risks, benefits and alternatives for the proposed anesthesia with the patient or authorized representative who has indicated his/her understanding and acceptance.     Plan Discussed with: CRNA, Anesthesiologist and Surgeon  Anesthesia Plan Comments:         Anesthesia Quick Evaluation

## 2014-05-01 NOTE — Progress Notes (Signed)
Patient ID: Peter BERGSMA Sr., male   DOB: 1951/02/25, 63 y.o.   MRN: [id number] EVENING ROUNDS NOTE :     [address]       [place] [address]             [phone]                 Day of Surgery Procedure(s) (LRB): CORONARY ARTERY BYPASS GRAFTING (CABG) (N/A) INTRAOPERATIVE TRANSESOPHAGEAL ECHOCARDIOGRAM (N/A)  Total Length of Stay:  LOS: 7 days  BP 105/65  Pulse 110  Temp(Src) 99.3 F (37.4 C) (Oral)  Resp 24  Ht 5\' 7"  (1.702 m)  Wt 205 lb 3.2 oz (93.078 kg)  BMI 32.13 kg/m2  SpO2 98%  .Intake/Output     07/16 0701 - 07/17 0700   P.O.    I.V. (mL/kg) 2341.3 (25.2)   Blood 803   IV Piggyback 400   Total Intake(mL/kg) 3544.3 (38.1)   Urine (mL/kg/hr) 3440 (2.5)   Blood 1100 (0.8)   Chest Tube 240 (0.2)   Total Output 4780   Net -1235.7         . sodium chloride 10 mL/hr at 05/01/14 1900  . sodium chloride 20 mL/hr at 05/01/14 1255  . [START ON 05/02/2014] sodium chloride    . dexmedetomidine Stopped (05/01/14 1600)  . DOPamine 5 mcg/kg/min (05/01/14 1900)  . insulin (NOVOLIN-R) infusion 3.8 Units/hr (05/01/14 1900)  . lactated ringers 20 mL/hr at 05/01/14 1900  . milrinone 0.3 mcg/kg/min (05/01/14 2045)  . nitroGLYCERIN Stopped (05/01/14 1255)  . phenylephrine (NEO-SYNEPHRINE) Adult infusion 40 mcg/min (05/01/14 2106)     Lab Results  Component Value Date   WBC 15.3* 05/01/2014   HGB 15.3 05/01/2014   HCT 45.0 05/01/2014   PLT 164 05/01/2014   GLUCOSE 133* 05/01/2014   CHOL 170 04/29/2014   TRIG 104 04/29/2014   HDL 60 04/29/2014   LDLCALC 89 04/29/2014   ALT 16 12/13/2013   AST 19 12/13/2013   NA 138 05/01/2014   K 4.3 05/01/2014   CL 104 05/01/2014   CREATININE 1.00 05/01/2014   BUN 14 05/01/2014   CO2 22 04/29/2014   INR 1.21 05/01/2014   HGBA1C  Value: 6.1 (NOTE)                                                                       According to the ADA Clinical Practice Recommendations for 2011, when HbA1c is used as a screening test:    >=6.5%   Diagnostic of Diabetes Mellitus           (if abnormal result  is confirmed)  5.7-6.4%   Increased risk of developing Diabetes Mellitus  References:Diagnosis and Classification of Diabetes Mellitus,Diabetes Care,2011,34(Suppl 1):S62-S69 and Standards of Medical Care in         Diabetes - 2011,Diabetes Care,2011,34  (Suppl 1):S11-S61.* 12/31/2010   Extubated now, not bleeding Poor ef , on milirone and dopamine Confused     Delight Ovens MD  Beeper [phone] Office [phone] 05/01/2014 10:05 PM

## 2014-05-01 NOTE — Transfer of Care (Addendum)
Immediate Anesthesia Transfer of Care Note  Patient: Peter PELLECCHIA Sr.  Procedure(s) Performed: Procedure(s) with comments: CORONARY ARTERY BYPASS GRAFTING (CABG) (N/A) - Times 5 using left internal mammary artery and endoscopically harvested right saphenous vein INTRAOPERATIVE TRANSESOPHAGEAL ECHOCARDIOGRAM (N/A)  Patient Location: SICU  Anesthesia Type:General  Level of Consciousness: Patient remains intubated per anesthesia plan  Airway & Oxygen Therapy: Patient remains intubated per anesthesia plan and Patient placed on Ventilator (see vital sign flow sheet for setting)  Post-op Assessment: Report given to PACU RN  Post vital signs: Reviewed and stable  Complications: No apparent anesthesia complications

## 2014-05-01 NOTE — Brief Op Note (Signed)
      [address]       [place] [address]             [phone]     04/24/2014 - 05/01/2014  11:15 AM  PATIENT:  Clair Gulling Sr.  63 y.o. male  PRE-OPERATIVE DIAGNOSIS:  CAD  POST-OPERATIVE DIAGNOSIS:  CAD  PROCEDURE:  Procedure(s): CORONARY ARTERY BYPASS GRAFTING (CABG)X5 LIMA-LAD; SEQ SVG-OM1-OM2; SVG-PD; SVG-DIAG INTRAOPERATIVE TRANSESOPHAGEAL ECHOCARDIOGRAM [facility] RIGHT LEG  SURGEON:  Surgeon(s): Alleen Borne, MD  PHYSICIAN ASSISTANT: [place] PA-C  ANESTHESIA:   general  PATIENT CONDITION:  ICU - intubated and hemodynamically stable.  PRE-OPERATIVE WEIGHT: 93kg  COMPLICATIONS: NO KNOWN

## 2014-05-01 NOTE — Anesthesia Postprocedure Evaluation (Signed)
  Anesthesia Post-op Note  Patient: Peter FRETWELL Sr.  Procedure(s) Performed: Procedure(s) with comments: CORONARY ARTERY BYPASS GRAFTING (CABG) (N/A) - Times 5 using left internal mammary artery and endoscopically harvested right saphenous vein INTRAOPERATIVE TRANSESOPHAGEAL ECHOCARDIOGRAM (N/A)  Patient Location: SICU  Anesthesia Type:General  Level of Consciousness: Patient remains intubated per anesthesia plan  Airway and Oxygen Therapy: Patient remains intubated per anesthesia plan and Patient placed on Ventilator (see vital sign flow sheet for setting)  Post-op Pain: none  Post-op Assessment: Post-op Vital signs reviewed, Patient's Cardiovascular Status Stable and Respiratory Function Stable  Post-op Vital Signs: Reviewed and stable  Last Vitals:  Filed Vitals:   05/01/14 0525  BP: 109/78  Pulse: 75  Temp: 36.6 C  Resp: 16    Complications: No apparent anesthesia complications

## 2014-05-01 NOTE — Procedures (Signed)
Extubation Procedure Note  Patient Details:   Name: Peter Booker Sr. DOB: Aug 13, 1951 MRN: [id number]   Airway Documentation:     Evaluation  O2 sats: stable throughout Complications: No apparent complications Patient did tolerate procedure well. Bilateral Breath Sounds: Clear   Yes pt able to vocalize.  Pt extubated at this time per Rapid wean protocol. No complications at this time. Pt able to breathe around deflated cuff. No stridor noted. VS stable. VC 0.95L, Nif -40cm H2O, IS performed 1023mLx5. RT will continue to monitor.    Loyal Jacobson [facility] 05/01/2014, 4:16 PM

## 2014-05-01 NOTE — Progress Notes (Signed)
Echo Lab  2D Echocardiogram completed.  Zaidee Rion L Adja Ruff, RDCS 05/01/2014 8:38 AM

## 2014-05-01 NOTE — Progress Notes (Signed)
Upon entry to the patient's room the [facility] was up >45 degrees and the patient continuously attempted to reposition himself using his arms.  He was asked not to use his arms and instructed that using his arms puts stress on his sternal incision and puts him at risk for the sternal incision to open.  The patient continues to be impulsive and attempting to reposition using his arms. When asked to stop a second time the patient stated "you don't know what you're talking about", "you don't have any training". The patient complains of being uncomfortable, and repeatedly asks for pillows to be placed and removed, for the [facility] and FOB to be raised and lowered.  It was explained to him that it might be beneficial to try to stay in one position for a little while and the RN would not be able to stay in the room all night, to which his response was "I'm supposed to have a nurse in here with me 24/7".  Will continue to monitor.

## 2014-05-01 NOTE — OR Nursing (Signed)
SICU First call @1152 
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
## 2014-05-01 NOTE — Anesthesia Procedure Notes (Addendum)
Procedure Name: Intubation Date/Time: 05/01/2014 7:42 AM Performed by: Jefm Miles E Pre-anesthesia Checklist: Patient identified, Emergency Drugs available, Suction available, Patient being monitored and Timeout performed Patient Re-evaluated:Patient Re-evaluated prior to inductionOxygen Delivery Method: Circle system utilized Preoxygenation: Pre-oxygenation with 100% oxygen Intubation Type: IV induction Ventilation: Mask ventilation without difficulty Laryngoscope Size: Mac and 3 Grade View: Grade I Tube type: Oral Tube size: 8.0 mm Number of attempts: 1 Airway Equipment and Method: Stylet Placement Confirmation: ETT inserted through vocal cords under direct vision,  positive ETCO2 and breath sounds checked- equal and bilateral Secured at: 22 cm Tube secured with: Tape Dental Injury: Teeth and Oropharynx as per pre-operative assessment

## 2014-05-02 ENCOUNTER — Inpatient Hospital Stay (HOSPITAL_COMMUNITY): Payer: Medicaid Other

## 2014-05-02 ENCOUNTER — Encounter (HOSPITAL_COMMUNITY): Payer: Self-pay | Admitting: Surgery

## 2014-05-02 DIAGNOSIS — E8779 Other fluid overload: Secondary | ICD-10-CM

## 2014-05-02 LAB — BASIC METABOLIC PANEL
Anion gap: 15 (ref 5–15)
BUN: 20 mg/dL (ref 6–23)
CHLORIDE: 98 meq/L (ref 96–112)
CO2: 21 mEq/L (ref 19–32)
Calcium: 8 mg/dL — ABNORMAL LOW (ref 8.4–10.5)
Creatinine, Ser: 1.13 mg/dL (ref 0.50–1.35)
GFR, EST AFRICAN AMERICAN: 79 mL/min — AB (ref >90)
GFR, EST NON AFRICAN AMERICAN: 68 mL/min — AB (ref >90)
Glucose, Bld: 128 mg/dL — ABNORMAL HIGH (ref 70–99)
POTASSIUM: 4.3 meq/L (ref 3.7–5.3)
SODIUM: 134 meq/L — AB (ref 137–147)

## 2014-05-02 LAB — GLUCOSE, CAPILLARY
GLUCOSE-CAPILLARY: 143 mg/dL — AB (ref 70–99)
GLUCOSE-CAPILLARY: 125 mg/dL — AB (ref 70–99)
GLUCOSE-CAPILLARY: 119 mg/dL — AB (ref 70–99)
GLUCOSE-CAPILLARY: 143 mg/dL — AB (ref 70–99)
GLUCOSE-CAPILLARY: 117 mg/dL — AB (ref 70–99)
GLUCOSE-CAPILLARY: 115 mg/dL — AB (ref 70–99)
GLUCOSE-CAPILLARY: 107 mg/dL — AB (ref 70–99)
GLUCOSE-CAPILLARY: 105 mg/dL — AB (ref 70–99)
Glucose-Capillary: 100 mg/dL — ABNORMAL HIGH (ref 70–99)
Glucose-Capillary: 110 mg/dL — ABNORMAL HIGH (ref 70–99)
Glucose-Capillary: 114 mg/dL — ABNORMAL HIGH (ref 70–99)
Glucose-Capillary: 120 mg/dL — ABNORMAL HIGH (ref 70–99)
Glucose-Capillary: 122 mg/dL — ABNORMAL HIGH (ref 70–99)
Glucose-Capillary: 126 mg/dL — ABNORMAL HIGH (ref 70–99)
Glucose-Capillary: 128 mg/dL — ABNORMAL HIGH (ref 70–99)
Glucose-Capillary: 129 mg/dL — ABNORMAL HIGH (ref 70–99)
Glucose-Capillary: 134 mg/dL — ABNORMAL HIGH (ref 70–99)
Glucose-Capillary: 139 mg/dL — ABNORMAL HIGH (ref 70–99)
Glucose-Capillary: 139 mg/dL — ABNORMAL HIGH (ref 70–99)
Glucose-Capillary: 139 mg/dL — ABNORMAL HIGH (ref 70–99)
Glucose-Capillary: 142 mg/dL — ABNORMAL HIGH (ref 70–99)
Glucose-Capillary: 154 mg/dL — ABNORMAL HIGH (ref 70–99)
Glucose-Capillary: 156 mg/dL — ABNORMAL HIGH (ref 70–99)
Glucose-Capillary: 96 mg/dL (ref 70–99)

## 2014-05-02 LAB — CBC
HCT: 35.1 % — ABNORMAL LOW (ref 39.0–52.0)
HEMATOCRIT: 33.7 % — AB (ref 39.0–52.0)
HEMOGLOBIN: 11.8 g/dL — AB (ref 13.0–17.0)
Hemoglobin: 11.3 g/dL — ABNORMAL LOW (ref 13.0–17.0)
MCH: 32 pg (ref 26.0–34.0)
MCH: 32.7 pg (ref 26.0–34.0)
MCHC: 33.6 g/dL (ref 30.0–36.0)
MCHC: 33.5 g/dL (ref 30.0–36.0)
MCV: 95.1 fL (ref 78.0–100.0)
MCV: 97.4 fL (ref 78.0–100.0)
Platelets: 132 10*3/uL — ABNORMAL LOW (ref 150–400)
Platelets: 116 10*3/uL — ABNORMAL LOW (ref 150–400)
RBC: 3.69 MIL/uL — ABNORMAL LOW (ref 4.22–5.81)
RBC: 3.46 MIL/uL — AB (ref 4.22–5.81)
RDW: 13.7 % (ref 11.5–15.5)
RDW: 13.9 % (ref 11.5–15.5)
WBC: 16.2 10*3/uL — ABNORMAL HIGH (ref 4.0–10.5)
WBC: 16.7 10*3/uL — ABNORMAL HIGH (ref 4.0–10.5)

## 2014-05-02 LAB — PREPARE PLATELET PHERESIS: Unit division: 0

## 2014-05-02 LAB — POCT I-STAT, CHEM 8
BUN: 25 mg/dL — ABNORMAL HIGH (ref 6–23)
CALCIUM ION: 1.09 mmol/L — AB (ref 1.13–1.30)
CHLORIDE: 97 meq/L (ref 96–112)
CREATININE: 1.2 mg/dL (ref 0.50–1.35)
GLUCOSE: 152 mg/dL — AB (ref 70–99)
HEMATOCRIT: 35 % — AB (ref 39.0–52.0)
Hemoglobin: 11.9 g/dL — ABNORMAL LOW (ref 13.0–17.0)
Potassium: 4.6 mEq/L (ref 3.7–5.3)
Sodium: 130 mEq/L — ABNORMAL LOW (ref 137–147)
TCO2: 23 mmol/L (ref 0–100)

## 2014-05-02 LAB — CREATININE, SERUM
Creatinine, Ser: 1.08 mg/dL (ref 0.50–1.35)
GFR calc Af Amer: 84 mL/min — ABNORMAL LOW (ref >90)
GFR calc non Af Amer: 72 mL/min — ABNORMAL LOW (ref >90)

## 2014-05-02 LAB — MAGNESIUM
MAGNESIUM: 2.7 mg/dL — AB (ref 1.5–2.5)
Magnesium: 2.3 mg/dL (ref 1.5–2.5)

## 2014-05-02 MED ORDER — ENOXAPARIN SODIUM 40 MG/0.4ML ~~LOC~~ SOLN
40.0000 mg | Freq: Every day | SUBCUTANEOUS | Status: DC
Start: 2014-05-02 — End: 2014-05-06
  Administered 2014-05-02 – 2014-05-05 (×4): 40 mg via SUBCUTANEOUS
  Filled 2014-05-02 (×5): qty 0.4

## 2014-05-02 MED ORDER — INSULIN DETEMIR 100 UNIT/ML ~~LOC~~ SOLN
20.0000 [IU] | Freq: Every day | SUBCUTANEOUS | Status: DC
  Administered 2014-05-03 – 2014-05-04 (×2): 20 [IU] via SUBCUTANEOUS
  Filled 2014-05-02 (×2): qty 0.2

## 2014-05-02 MED ORDER — INSULIN ASPART 100 UNIT/ML ~~LOC~~ SOLN
0.0000 [IU] | SUBCUTANEOUS | Status: DC
  Administered 2014-05-02 – 2014-05-03 (×5): 2 [IU] via SUBCUTANEOUS

## 2014-05-02 MED ORDER — FUROSEMIDE 10 MG/ML IJ SOLN
40.0000 mg | Freq: Once | INTRAMUSCULAR | Status: AC
  Administered 2014-05-02: 40 mg via INTRAVENOUS
  Filled 2014-05-02: qty 4

## 2014-05-02 MED ORDER — ALPRAZOLAM 0.5 MG PO TABS
0.5000 mg | ORAL_TABLET | Freq: Two times a day (BID) | ORAL | Status: DC | PRN
  Administered 2014-05-02 – 2014-05-04 (×2): 0.5 mg via ORAL
  Filled 2014-05-02 (×3): qty 1

## 2014-05-02 MED ORDER — INSULIN DETEMIR 100 UNIT/ML ~~LOC~~ SOLN
20.0000 [IU] | Freq: Once | SUBCUTANEOUS | Status: AC
  Administered 2014-05-02: 20 [IU] via SUBCUTANEOUS
  Filled 2014-05-02: qty 0.2

## 2014-05-02 MED FILL — Sodium Chloride IV Soln 0.9%: INTRAVENOUS | Qty: 2000 | Status: AC

## 2014-05-02 MED FILL — Sodium Bicarbonate IV Soln 8.4%: INTRAVENOUS | Qty: 50 | Status: AC

## 2014-05-02 MED FILL — Magnesium Sulfate Inj 50%: INTRAMUSCULAR | Qty: 10 | Status: AC

## 2014-05-02 MED FILL — Heparin Sodium (Porcine) Inj 1000 Unit/ML: INTRAMUSCULAR | Qty: 30 | Status: AC

## 2014-05-02 MED FILL — Electrolyte-R (PH 7.4) Solution: INTRAVENOUS | Qty: 4000 | Status: AC

## 2014-05-02 MED FILL — Lidocaine HCl IV Inj 20 MG/ML: INTRAVENOUS | Qty: 5 | Status: AC

## 2014-05-02 MED FILL — Mannitol IV Soln 20%: INTRAVENOUS | Qty: 500 | Status: AC

## 2014-05-02 MED FILL — Potassium Chloride Inj 2 mEq/ML: INTRAVENOUS | Qty: 40 | Status: AC

## 2014-05-02 MED FILL — Heparin Sodium (Porcine) Inj 1000 Unit/ML: INTRAMUSCULAR | Qty: 10 | Status: AC

## 2014-05-02 NOTE — Progress Notes (Signed)
Patient ID: Peter FITT Sr., male   DOB: 17-Dec-1950, 63 y.o.   MRN: [id number]  SICU Evening Rounds:  Hemodynamically stable off inotropes  Diuresed some today.  Alert and up in chair.  BMET    Component Value Date/Time   NA 130* 05/02/2014 1704   K 4.6 05/02/2014 1704   CL 97 05/02/2014 1704   CO2 21 05/02/2014 0420   GLUCOSE 152* 05/02/2014 1704   BUN 25* 05/02/2014 1704   CREATININE 1.20 05/02/2014 1704   CREATININE 1.00 12/13/2013 1115   CALCIUM 8.0* 05/02/2014 0420   GFRNONAA 72* 05/02/2014 1656   GFRNONAA 81 12/13/2013 1115   GFRAA 84* 05/02/2014 1656   GFRAA >89 12/13/2013 1115    CBC    Component Value Date/Time   WBC 16.7* 05/02/2014 1656   RBC 3.46* 05/02/2014 1656   HGB 11.9* 05/02/2014 1704   HCT 35.0* 05/02/2014 1704   PLT 116* 05/02/2014 1656   MCV 97.4 05/02/2014 1656   MCH 32.7 05/02/2014 1656   MCHC 33.5 05/02/2014 1656   RDW 13.9 05/02/2014 1656   LYMPHSABS 1.2 07/29/2013 2055   MONOABS 0.6 07/29/2013 2055   EOSABS 0.1 07/29/2013 2055   BASOSABS 0.0 07/29/2013 2055

## 2014-05-02 NOTE — Progress Notes (Signed)
1 Day Post-Op Procedure(s) (LRB): CORONARY ARTERY BYPASS GRAFTING (CABG) (N/A) INTRAOPERATIVE TRANSESOPHAGEAL ECHOCARDIOGRAM (N/A) Subjective: Sore  Objective: Vital signs in last 24 hours: Temp:  [97.7 F (36.5 C)-100.4 F (38 C)] 98.8 F (37.1 C) (07/17 0700) Pulse Rate:  [89-128] 91 (07/17 0700) Cardiac Rhythm:  [-] Normal sinus rhythm (07/17 0600) Resp:  [12-31] 17 (07/17 0700) BP: (81-116)/(53-82) 116/58 mmHg (07/17 0700) SpO2:  [88 %-100 %] 94 % (07/17 0700) Arterial Line BP: (78-142)/(50-78) 125/70 mmHg (07/17 0700) FiO2 (%):  [40 %-50 %] 40 % (07/16 1521) Weight:  [97.886 kg (215 lb 12.8 oz)] 97.886 kg (215 lb 12.8 oz) (07/17 0440)  Hemodynamic parameters for last 24 hours: PAP: (20-44)/(4-34) 37/23 mmHg CO:  [1.4 L/min-5 L/min] 4.2 L/min CI:  [1.8 L/min/m2-2.5 L/min/m2] 2.1 L/min/m2  Intake/Output from previous day: 07/16 0701 - 07/17 0700 In: 4884.1 [I.V.:3581.1; Blood:803; IV Piggyback:500] Out: 5420 [Urine:3900; Blood:1100; Chest Tube:420] Intake/Output this shift:    General appearance: alert and cooperative Neurologic: intact Heart: regular rate and rhythm, S1, S2 normal, no murmur, click, rub or gallop Lungs: clear to auscultation bilaterally Extremities: edema mild Wound: dressing dry  Lab Results:  Recent Labs  05/01/14 1800 05/01/14 1825 05/02/14 0420  WBC 15.3*  --  16.2*  HGB 13.4 15.3 11.8*  HCT 38.9* 45.0 35.1*  PLT 164  --  132*   BMET:  Recent Labs  05/01/14 1825 05/02/14 0420  NA 138 134*  K 4.3 4.3  CL 104 98  CO2  --  21  GLUCOSE 133* 128*  BUN 14 20  CREATININE 1.00 1.13  CALCIUM  --  8.0*    PT/INR:  Recent Labs  05/01/14 1255  LABPROT 15.3*  INR 1.21   ABG    Component Value Date/Time   PHART 7.375 05/01/2014 1819   HCO3 22.7 05/01/2014 1819   TCO2 21 05/01/2014 1825   ACIDBASEDEF 2.0 05/01/2014 1819   O2SAT 95.0 05/01/2014 1819   CBG (last 3)   Recent Labs  05/02/14 0404 05/02/14 0502 05/02/14 0607   GLUCAP 126* 117* 115*    Assessment/Plan: S/P Procedure(s) (LRB): CORONARY ARTERY BYPASS GRAFTING (CABG) (N/A) INTRAOPERATIVE TRANSESOPHAGEAL ECHOCARDIOGRAM (N/A) Hemodynamically stable. Preop EF 25-30%. Wean milrinone and dopamine as tolerated today. Mobilize Diuresis Diabetes control: Hgb A1c 6.1 d/c tubes/lines Continue foley due to patient in ICU and urinary output monitoring See progression orders   LOS: 8 days    Peter Booker K 05/02/2014

## 2014-05-02 NOTE — Progress Notes (Signed)
Subjective:  Day 1 s/p CABG x5  Objective:   Vital Signs in the last 24 hours: Temp:  [97.7 F (36.5 C)-100.4 F (38 C)] 98.6 F (37 C) (07/17 0945) Pulse Rate:  [84-128] 84 (07/17 1100) Resp:  [12-31] 20 (07/17 1100) BP: (81-141)/(53-82) 141/77 mmHg (07/17 1000) SpO2:  [88 %-100 %] 94 % (07/17 1100) Arterial Line BP: (78-169)/(50-86) 139/69 mmHg (07/17 1100) FiO2 (%):  [40 %-50 %] 40 % (07/16 1521) Weight:  [215 lb 12.8 oz (97.886 kg)] 215 lb 12.8 oz (97.886 kg) (07/17 0440)  Intake/Output from previous day: 07/16 0701 - 07/17 0700 In: 4884.1 [I.V.:3581.1; Blood:803; IV Piggyback:500] Out: 5420 [Urine:3900; Blood:1100; Chest Tube:420]  Medications: . acetaminophen  1,000 mg Oral 4 times per day   Or  . acetaminophen (TYLENOL) oral liquid 160 mg/5 mL  1,000 mg Per Tube 4 times per day  . aspirin EC  325 mg Oral Daily   Or  . aspirin  324 mg Per Tube Daily  . atorvastatin  40 mg Oral q1800  . bisacodyl  10 mg Oral Daily   Or  . bisacodyl  10 mg Rectal Daily  . cefUROXime (ZINACEF)  IV  1.5 g Intravenous Q12H  . docusate sodium  200 mg Oral Daily  . enoxaparin (LOVENOX) injection  40 mg Subcutaneous QHS  . insulin aspart  0-24 Units Subcutaneous 6 times per day  . insulin detemir  20 Units Subcutaneous Once  . [START ON 05/03/2014] insulin detemir  20 Units Subcutaneous Daily  . insulin regular  0-10 Units Intravenous TID WC  . metoprolol tartrate  12.5 mg Oral BID   Or  . metoprolol tartrate  12.5 mg Per Tube BID  . [START ON 05/03/2014] pantoprazole  40 mg Oral Daily  . sodium chloride  3 mL Intravenous Q12H    . sodium chloride 20 mL/hr at 05/02/14 0600  . sodium chloride 20 mL/hr at 05/01/14 1900  . sodium chloride 250 mL (05/02/14 0510)  . dexmedetomidine Stopped (05/01/14 1600)  . DOPamine 5 mcg/kg/min (05/02/14 0600)  . insulin (NOVOLIN-R) infusion 4.3 Units/hr (05/02/14 1100)  . lactated ringers 20 mL/hr at 05/02/14 0600  . milrinone 0.1 mcg/kg/min  (05/02/14 0845)  . nitroGLYCERIN Stopped (05/01/14 1255)  . phenylephrine (NEO-SYNEPHRINE) Adult infusion Stopped (05/02/14 0600)    Physical Exam:   General appearance: alert, cooperative and no distress Neck: supple, symmetrical, trachea midline and thyroid not enlarged, symmetric, no tenderness/mass/nodules Lungs: decreased Bs without wheezing Heart: regular rate and rhythm and no rub Abdomen: soft, non-tender; bowel sounds normal; no masses,  no organomegaly Extremities: trace edema LE Neurologic: Grossly normal   Rate: 88  Rhythm: normal sinus rhythm  Lab Results:   Recent Labs  05/01/14 1825 05/02/14 0420  NA 138 134*  K 4.3 4.3  CL 104 98  CO2  --  21  GLUCOSE 133* 128*  BUN 14 20  CREATININE 1.00 1.13    No results found for this basename: TROPONINI, CK, MB,  in the last 72 hours  Hepatic Function Panel No results found for this basename: PROT, ALBUMIN, AST, ALT, ALKPHOS, BILITOT, BILIDIR, IBILI,  in the last 72 hours  Recent Labs  05/01/14 1255  INR 1.21   BNP (last 3 results)  Recent Labs  07/11/13 0657 07/29/13 1308 11/20/13 1159  PROBNP 23.4 61.6 1011.0*    Lipid Panel     Component Value Date/Time   CHOL 170 04/29/2014 0500   TRIG 104 04/29/2014 0500  HDL 60 04/29/2014 0500   CHOLHDL 2.8 04/29/2014 0500   VLDL 21 04/29/2014 0500   LDLCALC 89 04/29/2014 0500      Imaging:  Dg Chest Portable 1 View In Am  05/02/2014   CLINICAL DATA:  CABG.  EXAM: PORTABLE CHEST - 1 VIEW  COMPARISON:  05/01/2014 .  FINDINGS: Interim extubation and removal of NG tube. Swan-Ganz catheter, mediastinal drainage catheter, left chest tube in stable position. Basilar atelectasis and/or infiltrates. No pleural effusion or pneumothorax. Prior CABG. Cardiomegaly, no pulmonary venous congestion. Degenerative changes both shoulders.  IMPRESSION: 1. Interim extubation and removal of NG tube. Remaining catheter is including left chest tube in stable position. 2. New onset  bibasilar atelectasis and/or infiltrates.   Electronically Signed   By: Maisie Fushomas  Register   On: 05/02/2014 08:17   Dg Chest Portable 1 View  05/01/2014   CLINICAL DATA:  CABG.  EXAM: PORTABLE CHEST - 1 VIEW  COMPARISON:  CT 11/20/2013.  Chest x-ray 11/20/2013.  FINDINGS: Endotracheal tube, NG tube, Port-A-Cath, mediastinal drainage catheter, left chest tube in good anatomic position. No pneumothorax. Prior CABG. Mild cardiomegaly, no CHF.  IMPRESSION: No active disease.   Electronically Signed   By: Maisie Fushomas  Register   On: 05/01/2014 13:30      Assessment/Plan:   Principal Problem:   Unstable angina Active Problems:   Coronary atherosclerosis of native coronary artery   HTN (hypertension)   Cardiomyopathy, ischemic   At risk for sudden cardiac death, hx MI, EF 25%, Lt main disease   NSVT (nonsustained ventricular tachycardia)   S/P CABG x 5  Stable initial hemodynamics. Milrinone and dopamine being weaned as tolerates. Continue diuresis. -535 yesterday and -443 today so far. Rhythm stable.     Lennette Biharihomas A. Lettie Czarnecki, MD, [facility] 05/02/2014, 11:52 AM

## 2014-05-03 ENCOUNTER — Inpatient Hospital Stay (HOSPITAL_COMMUNITY): Payer: Medicaid Other

## 2014-05-03 LAB — BASIC METABOLIC PANEL
Anion gap: 13 (ref 5–15)
BUN: 21 mg/dL (ref 6–23)
CALCIUM: 7.8 mg/dL — AB (ref 8.4–10.5)
CO2: 24 mEq/L (ref 19–32)
Chloride: 93 mEq/L — ABNORMAL LOW (ref 96–112)
Creatinine, Ser: 0.93 mg/dL (ref 0.50–1.35)
GFR calc Af Amer: >90 mL/min (ref >90)
GFR, EST NON AFRICAN AMERICAN: 89 mL/min — AB (ref >90)
GLUCOSE: 142 mg/dL — AB (ref 70–99)
Potassium: 4.9 mEq/L (ref 3.7–5.3)
Sodium: 130 mEq/L — ABNORMAL LOW (ref 137–147)

## 2014-05-03 LAB — GLUCOSE, CAPILLARY
GLUCOSE-CAPILLARY: 136 mg/dL — AB (ref 70–99)
GLUCOSE-CAPILLARY: 138 mg/dL — AB (ref 70–99)
Glucose-Capillary: 132 mg/dL — ABNORMAL HIGH (ref 70–99)
Glucose-Capillary: 121 mg/dL — ABNORMAL HIGH (ref 70–99)
Glucose-Capillary: 113 mg/dL — ABNORMAL HIGH (ref 70–99)
Glucose-Capillary: 120 mg/dL — ABNORMAL HIGH (ref 70–99)

## 2014-05-03 LAB — CBC
HEMATOCRIT: 30.1 % — AB (ref 39.0–52.0)
Hemoglobin: 10.2 g/dL — ABNORMAL LOW (ref 13.0–17.0)
MCH: 32.7 pg (ref 26.0–34.0)
MCHC: 33.9 g/dL (ref 30.0–36.0)
MCV: 96.5 fL (ref 78.0–100.0)
PLATELETS: 98 10*3/uL — AB (ref 150–400)
RBC: 3.12 MIL/uL — ABNORMAL LOW (ref 4.22–5.81)
RDW: 14.2 % (ref 11.5–15.5)
WBC: 12.7 10*3/uL — ABNORMAL HIGH (ref 4.0–10.5)

## 2014-05-03 MED ORDER — POTASSIUM CHLORIDE CRYS ER 20 MEQ PO TBCR
20.0000 meq | EXTENDED_RELEASE_TABLET | Freq: Two times a day (BID) | ORAL | Status: DC
  Administered 2014-05-04 – 2014-05-05 (×4): 20 meq via ORAL
  Filled 2014-05-03 (×7): qty 1

## 2014-05-03 MED ORDER — SODIUM CHLORIDE 0.9 % IJ SOLN: 3.0000 mL | INTRAMUSCULAR | Status: DC | PRN

## 2014-05-03 MED ORDER — BISACODYL 5 MG PO TBEC
10.0000 mg | DELAYED_RELEASE_TABLET | Freq: Every day | ORAL | Status: DC | PRN
  Administered 2014-05-04 – 2014-05-06 (×2): 10 mg via ORAL
  Filled 2014-05-03 (×2): qty 2

## 2014-05-03 MED ORDER — FUROSEMIDE 40 MG PO TABS
40.0000 mg | ORAL_TABLET | Freq: Every day | ORAL | Status: DC
  Administered 2014-05-03 – 2014-05-05 (×3): 40 mg via ORAL
  Filled 2014-05-03 (×4): qty 1

## 2014-05-03 MED ORDER — ASPIRIN EC 325 MG PO TBEC
325.0000 mg | DELAYED_RELEASE_TABLET | Freq: Every day | ORAL | Status: DC
Start: 2014-05-04 — End: 2014-05-06
  Administered 2014-05-04 – 2014-05-05 (×2): 325 mg via ORAL
  Filled 2014-05-03 (×3): qty 1

## 2014-05-03 MED ORDER — BISACODYL 10 MG RE SUPP: 10.0000 mg | Freq: Every day | RECTAL | Status: DC | PRN

## 2014-05-03 MED ORDER — MOVING RIGHT ALONG BOOK
Freq: Once | Status: AC
  Administered 2014-05-03: 16:00:00
  Filled 2014-05-03: qty 1

## 2014-05-03 MED ORDER — SODIUM CHLORIDE 0.9 % IV SOLN: 250.0000 mL | INTRAVENOUS | Status: DC | PRN

## 2014-05-03 MED ORDER — ACETAMINOPHEN 325 MG PO TABS
650.0000 mg | ORAL_TABLET | Freq: Four times a day (QID) | ORAL | Status: DC | PRN
  Administered 2014-05-03 – 2014-05-06 (×8): 650 mg via ORAL
  Filled 2014-05-03 (×10): qty 2

## 2014-05-03 MED ORDER — CARVEDILOL 3.125 MG PO TABS
3.1250 mg | ORAL_TABLET | Freq: Two times a day (BID) | ORAL | Status: DC
  Administered 2014-05-03 – 2014-05-06 (×7): 3.125 mg via ORAL
  Filled 2014-05-03 (×13): qty 1

## 2014-05-03 MED ORDER — ONDANSETRON HCL 4 MG/2ML IJ SOLN
4.0000 mg | Freq: Four times a day (QID) | INTRAMUSCULAR | Status: DC | PRN
  Administered 2014-05-03 – 2014-05-05 (×3): 4 mg via INTRAVENOUS
  Filled 2014-05-03 (×3): qty 2

## 2014-05-03 MED ORDER — OXYCODONE HCL 5 MG PO TABS
5.0000 mg | ORAL_TABLET | ORAL | Status: DC | PRN
  Administered 2014-05-04 – 2014-05-05 (×7): 10 mg via ORAL
  Filled 2014-05-03 (×9): qty 2

## 2014-05-03 MED ORDER — SODIUM CHLORIDE 0.9 % IJ SOLN
3.0000 mL | Freq: Two times a day (BID) | INTRAMUSCULAR | Status: DC
  Administered 2014-05-03 – 2014-05-05 (×5): 3 mL via INTRAVENOUS

## 2014-05-03 MED ORDER — INSULIN ASPART 100 UNIT/ML ~~LOC~~ SOLN: 0.0000 [IU] | Freq: Three times a day (TID) | SUBCUTANEOUS | Status: DC

## 2014-05-03 MED ORDER — COLCHICINE 0.6 MG PO TABS
0.6000 mg | ORAL_TABLET | Freq: Every day | ORAL | Status: DC
  Administered 2014-05-03 – 2014-05-05 (×3): 0.6 mg via ORAL
  Filled 2014-05-03 (×4): qty 1

## 2014-05-03 MED ORDER — DOCUSATE SODIUM 100 MG PO CAPS
200.0000 mg | ORAL_CAPSULE | Freq: Every day | ORAL | Status: DC
  Administered 2014-05-04: 200 mg via ORAL
  Filled 2014-05-03 (×4): qty 2

## 2014-05-03 MED ORDER — PANTOPRAZOLE SODIUM 40 MG PO TBEC
40.0000 mg | DELAYED_RELEASE_TABLET | Freq: Every day | ORAL | Status: DC
  Administered 2014-05-04 – 2014-05-05 (×2): 40 mg via ORAL
  Filled 2014-05-03 (×3): qty 1

## 2014-05-03 MED ORDER — ONDANSETRON HCL 4 MG PO TABS: 4.0000 mg | ORAL_TABLET | Freq: Four times a day (QID) | ORAL | Status: DC | PRN

## 2014-05-03 NOTE — Progress Notes (Signed)
2 Days Post-Op Procedure(s) (LRB): CORONARY ARTERY BYPASS GRAFTING (CABG) (N/A) INTRAOPERATIVE TRANSESOPHAGEAL ECHOCARDIOGRAM (N/A) Subjective: No complaints. Slept well after Xanax which he says he takes at home.  Objective: Vital signs in last 24 hours: Temp:  [97.6 F (36.4 C)-99.1 F (37.3 C)] 98.4 F (36.9 C) (07/18 0744) Pulse Rate:  [68-97] 70 (07/18 0744) Cardiac Rhythm:  [-] Normal sinus rhythm (07/18 0744) Resp:  [15-28] 17 (07/18 0600) BP: (93-141)/(59-84) 132/84 mmHg (07/18 0744) SpO2:  [91 %-100 %] 97 % (07/18 0744) Arterial Line BP: (118-169)/(69-86) 134/73 mmHg (07/17 1300) Weight:  [96.9 kg (213 lb 10 oz)] 96.9 kg (213 lb 10 oz) (07/18 0500)  Hemodynamic parameters for last 24 hours: PAP: (35-52)/(18-33) 44/25 mmHg CO:  [3.7 L/min-4.7 L/min] 4.5 L/min CI:  [1.8 L/min/m2-2.3 L/min/m2] 2.2 L/min/m2  Intake/Output from previous day: 07/17 0701 - 07/18 0700 In: 1107.2 [P.O.:270; I.V.:737.2; IV Piggyback:100] Out: 2280 [Urine:2210; Chest Tube:70] Intake/Output this shift:    General appearance: alert and cooperative Neurologic: intact Heart: regular rate and rhythm, S1, S2 normal, no murmur, click, rub or gallop Lungs: clear to auscultation bilaterally Extremities: edema mild Wound: dressings dry  Lab Results:  Recent Labs  05/02/14 1656 05/02/14 1704 05/03/14 0500  WBC 16.7*  --  12.7*  HGB 11.3* 11.9* 10.2*  HCT 33.7* 35.0* 30.1*  PLT 116*  --  98*   BMET:  Recent Labs  05/02/14 0420  05/02/14 1704 05/03/14 0500  NA 134*  --  130* 130*  K 4.3  --  4.6 4.9  CL 98  --  97 93*  CO2 21  --   --  24  GLUCOSE 128*  --  152* 142*  BUN 20  --  25* 21  CREATININE 1.13  < > 1.20 0.93  CALCIUM 8.0*  --   --  7.8*  < > = values in this interval not displayed.  PT/INR:  Recent Labs  05/01/14 1255  LABPROT 15.3*  INR 1.21   ABG    Component Value Date/Time   PHART 7.375 05/01/2014 1819   HCO3 22.7 05/01/2014 1819   TCO2 23 05/02/2014 1704   ACIDBASEDEF 2.0 05/01/2014 1819   O2SAT 95.0 05/01/2014 1819   CBG (last 3)   Recent Labs  05/02/14 2348 05/03/14 0358 05/03/14 0740  GLUCAP 138* 136* 132*    Assessment/Plan: S/P Procedure(s) (LRB): CORONARY ARTERY BYPASS GRAFTING (CABG) (N/A) INTRAOPERATIVE TRANSESOPHAGEAL ECHOCARDIOGRAM (N/A) Hemodynamically stable. Preop EF 25-30% with severe systolic dysfunction. Will need that reevaluated with echo as outpt. Will switch lopressor to Coreg and plan to add ACE I after diuresis. Mobilize Diuresis Diabetes control: preop Hgb A1c 6.1. I doubt that he is compliant with any diet. Plan for transfer to step-down: see transfer orders   LOS: 9 days    BARTLE,BRYAN K 05/03/2014

## 2014-05-03 NOTE — Progress Notes (Signed)
Pt refused to walk at this time due to nausea, PRN medication already given, see MAR. Will try again later. Will continue to monitor.

## 2014-05-03 NOTE — Progress Notes (Signed)
Pt c/o chest soreness but refuses to take any pain medication. Pt also refusing to walk again at this time. Will continue to monitor

## 2014-05-04 LAB — GLUCOSE, CAPILLARY
GLUCOSE-CAPILLARY: 111 mg/dL — AB (ref 70–99)
Glucose-Capillary: 77 mg/dL (ref 70–99)
Glucose-Capillary: 121 mg/dL — ABNORMAL HIGH (ref 70–99)
Glucose-Capillary: 115 mg/dL — ABNORMAL HIGH (ref 70–99)

## 2014-05-04 LAB — BASIC METABOLIC PANEL
Anion gap: 17 — ABNORMAL HIGH (ref 5–15)
BUN: 21 mg/dL (ref 6–23)
CO2: 27 mEq/L (ref 19–32)
CREATININE: 0.95 mg/dL (ref 0.50–1.35)
Calcium: 8.6 mg/dL (ref 8.4–10.5)
Chloride: 92 mEq/L — ABNORMAL LOW (ref 96–112)
GFR calc Af Amer: >90 mL/min (ref >90)
GFR calc non Af Amer: 88 mL/min — ABNORMAL LOW (ref >90)
GLUCOSE: 117 mg/dL — AB (ref 70–99)
POTASSIUM: 4.2 meq/L (ref 3.7–5.3)
Sodium: 136 mEq/L — ABNORMAL LOW (ref 137–147)

## 2014-05-04 LAB — CBC
HCT: 29.9 % — ABNORMAL LOW (ref 39.0–52.0)
HEMOGLOBIN: 10.1 g/dL — AB (ref 13.0–17.0)
MCH: 33.3 pg (ref 26.0–34.0)
MCHC: 33.8 g/dL (ref 30.0–36.0)
MCV: 98.7 fL (ref 78.0–100.0)
Platelets: 113 10*3/uL — ABNORMAL LOW (ref 150–400)
RBC: 3.03 MIL/uL — ABNORMAL LOW (ref 4.22–5.81)
RDW: 14.1 % (ref 11.5–15.5)
WBC: 12 10*3/uL — AB (ref 4.0–10.5)

## 2014-05-04 NOTE — Progress Notes (Signed)
Pt ambulated in hall with front wheeled walker 300 ft. Pt tolerated well. To chair with call bell in reach.

## 2014-05-04 NOTE — Progress Notes (Addendum)
[address]       [place] [address]             [phone]      3 Days Post-Op Procedure(s) (LRB): CORONARY ARTERY BYPASS GRAFTING (CABG) (N/A) INTRAOPERATIVE TRANSESOPHAGEAL ECHOCARDIOGRAM (N/A) Subjective: Feels poorly primaritly d/t nausea and weakness   Objective: Vital signs in last 24 hours: Temp:  [97.6 F (36.4 C)-98.8 F (37.1 C)] 97.7 F (36.5 C) (07/19 0339) Pulse Rate:  [72-78] 73 (07/19 0339) Cardiac Rhythm:  [-] Normal sinus rhythm (07/19 0820) Resp:  [18-19] 18 (07/19 0339) BP: (106-136)/(66-73) 106/67 mmHg (07/19 0339) SpO2:  [90 %-93 %] 91 % (07/19 0339) Weight:  [209 lb 14.1 oz (95.2 kg)] 209 lb 14.1 oz (95.2 kg) (07/19 0339)  Hemodynamic parameters for last 24 hours:    Intake/Output from previous day: 07/18 0701 - 07/19 0700 In: 120 [P.O.:120] Out: 2025 [Urine:2025] Intake/Output this shift:    General appearance: alert, cooperative and no distress Heart: regular rate and rhythm Lungs: dim in the bases Abdomen: benign Extremities: + LEedema Wound: incis healing well, some drainage drom evh incision  Lab Results:  Recent Labs  05/03/14 0500 05/04/14 0330  WBC 12.7* 12.0*  HGB 10.2* 10.1*  HCT 30.1* 29.9*  PLT 98* 113*   BMET:  Recent Labs  05/03/14 0500 05/04/14 0330  NA 130* 136*  K 4.9 4.2  CL 93* 92*  CO2 24 27  GLUCOSE 142* 117*  BUN 21 21  CREATININE 0.93 0.95  CALCIUM 7.8* 8.6    PT/INR:  Recent Labs  05/01/14 1255  LABPROT 15.3*  INR 1.21   ABG    Component Value Date/Time   PHART 7.375 05/01/2014 1819   HCO3 22.7 05/01/2014 1819   TCO2 23 05/02/2014 1704   ACIDBASEDEF 2.0 05/01/2014 1819   O2SAT 95.0 05/01/2014 1819   CBG (last 3)   Recent Labs  05/03/14 2040 05/04/14 0612 05/04/14 1116  GLUCAP 120* 111* 77   Scheduled Meds: . aspirin EC  325 mg Oral Daily  . atorvastatin  40 mg Oral q1800  . carvedilol  3.125 mg Oral BID WC  . colchicine  0.6 mg Oral Daily  . docusate  sodium  200 mg Oral Daily  . enoxaparin (LOVENOX) injection  40 mg Subcutaneous QHS  . furosemide  40 mg Oral Daily  . insulin aspart  0-24 Units Subcutaneous TID AC & HS  . insulin detemir  20 Units Subcutaneous Daily  . pantoprazole  40 mg Oral QAC breakfast  . potassium chloride  20 mEq Oral BID  . sodium chloride  3 mL Intravenous Q12H   Continuous Infusions:  PRN Meds:.sodium chloride, acetaminophen, ALPRAZolam, bisacodyl, bisacodyl, ondansetron (ZOFRAN) IV, ondansetron, oxyCODONE, sodium chloride  Dg Chest Port 1 View  05/03/2014   CLINICAL DATA:  Postop cardiac surgery  EXAM: PORTABLE CHEST - 1 VIEW  COMPARISON:  Prior chest x-ray 05/02/2014  FINDINGS: Interval removal of the Swan-Ganz catheter and left-sided chest tube. The right IJ vascular sheath remains present. The tip overlies the upper SVC. No evidence of left-sided pneumothorax. Inspiratory volumes remain very low. There is persistent bibasilar atelectasis. Slightly improved interstitial edema. Patient is status post median sternotomy. Stable cardiac and mediastinal contours. No acute osseous abnormality.  IMPRESSION: 1. Interval removal of left chest tube and Swan-Ganz catheter. 2. No evidence of left pneumothorax. 3. Slightly improved interstitial pulmonary edema. 4. Persistent low inspiratory volumes with bibasilar atelectasis.   Electronically Signed  By: Malachy MoanHeath  McCullough M.D.   On: 05/03/2014 07:55    Assessment/Plan: S/P Procedure(s) (LRB): CORONARY ARTERY BYPASS GRAFTING (CABG) (N/A) INTRAOPERATIVE TRANSESOPHAGEAL ECHOCARDIOGRAM (N/A)  1 hemodyn stable in sinus rhythm 2 CBC stable, platelets improving 3 renal fxn normal , cont gentle diuresis for volume overload 4 push pulm toilet/rehab as able 5 sugars controlled- d/c insulin soon- on no meds at home   LOS: 10 days    GOLD,WAYNE E 05/04/2014   Chart reviewed, patient examined, agree with above. Just had large BM and feels much better. Nausea  resolved. Glucose under control. Hgb A1c 6.1 preop with no hx of DM. He will need diet education, exercise and wt loss.

## 2014-05-04 NOTE — Progress Notes (Signed)
Pt ambulated in hallway 350ft with rolling walker and took several standing breaks. Pt back in room to chair with brother visiting. Pt not compliant with sternal precautions. Pt reminded again of importance of sternal precautions and pt states hastly " I know what I'm doing." Will continue to montior.

## 2014-05-05 LAB — GLUCOSE, CAPILLARY: Glucose-Capillary: 113 mg/dL — ABNORMAL HIGH (ref 70–99)

## 2014-05-05 NOTE — Progress Notes (Signed)
Patient would like to discuss with MD about continuing his nausea medication and Xanax at discharge. Stanton Kidney R
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
## 2014-05-05 NOTE — Progress Notes (Addendum)
      [address]       [place] [address]             [phone]        4 Days Post-Op Procedure(s) (LRB): CORONARY ARTERY BYPASS GRAFTING (CABG) (N/A) INTRAOPERATIVE TRANSESOPHAGEAL ECHOCARDIOGRAM (N/A)  Subjective: Patient has no complaints except he wants to go home.  Objective: Vital signs in last 24 hours: Temp:  [97.4 F (36.3 C)-98.2 F (36.8 C)] 97.4 F (36.3 C) (07/20 0502) Pulse Rate:  [60-69] 60 (07/20 0502) Cardiac Rhythm:  [-] Normal sinus rhythm (07/20 0140) Resp:  [18-19] 18 (07/20 0502) BP: (104-112)/(58-69) 110/69 mmHg (07/20 0502) SpO2:  [91 %-97 %] 97 % (07/20 0502) Weight:  [205 lb 4 oz (93.1 kg)] 205 lb 4 oz (93.1 kg) (07/20 0502)  Pre op weight 93 kg Current Weight  05/05/14 205 lb 4 oz (93.1 kg)        Physical Exam:  Cardiovascular: RRR, no murmurs, gallops, or rubs. Pulmonary: Clear to auscultation bilaterally; no rales, wheezes, or rhonchi. Abdomen: Soft, non tender, bowel sounds present. Extremities: Mild bilateral lower extremity edema R>L. Wounds: Sternal wound is clean and dry.  No erythema or signs of infection. RLE wound with sero sanguinous ooze.  Lab Results: CBC: Recent Labs  05/03/14 0500 05/04/14 0330  WBC 12.7* 12.0*  HGB 10.2* 10.1*  HCT 30.1* 29.9*  PLT 98* 113*   BMET:  Recent Labs  05/03/14 0500 05/04/14 0330  NA 130* 136*  K 4.9 4.2  CL 93* 92*  CO2 24 27  GLUCOSE 142* 117*  BUN 21 21  CREATININE 0.93 0.95  CALCIUM 7.8* 8.6    PT/INR:  Lab Results  Component Value Date   INR 1.21 05/01/2014   INR 0.98 05/01/2014   INR 1.0 04/23/2014   ABG:  INR: Will add last result for INR, ABG once components are confirmed Will add last 4 CBG results once components are confirmed  Assessment/Plan:  1. CV - SR in the 70's. On Coreg 3.125 bid. 2.  Pulmonary - Encourage incentive spirometer 3. Volume Overload - On Lasix 40 daily. Is at pre op weight. Will not need at discharge. 4.  Acute  blood loss anemia - H and H yesterday stable at 10.1 and 29.9. 5. Thrombocytopenia-platelets up to 113,000. 6. Remove EPW 7. CBGs 121/115/113.  HGA1C 6.1. He is pre diabetic.Stop accu checks. Will need follow up as an outpatient. 8. Possible discharge in am  ZIMMERMAN,DONIELLE MPA-C 05/05/2014,8:12 AM   Chart reviewed, patient examined, agree with above. He looks good and feels well.

## 2014-05-05 NOTE — Progress Notes (Signed)
EPW removed per order. Patient in NSR. Wires removed per protocol, ends intact, no resistance met and no bleeding noted. Bed rest until 11:50. Frequent vitals set up. Will continue to monitor closely. Roxan Hockey, RN

## 2014-05-05 NOTE — Progress Notes (Signed)
1336 Pt up walking independently with NT with steady gait. Will continue to follow. Luetta Nutting RN BSN 05/05/2014 1:36 PM

## 2014-05-06 MED ORDER — OXYCODONE-ACETAMINOPHEN 5-325 MG PO TABS: 1.0000 | ORAL_TABLET | ORAL | Status: DC | PRN

## 2014-05-06 MED ORDER — ALPRAZOLAM 0.5 MG PO TABS: 0.5000 mg | ORAL_TABLET | Freq: Two times a day (BID) | ORAL | Status: DC | PRN

## 2014-05-06 MED ORDER — ASPIRIN 325 MG PO TBEC: 325.0000 mg | DELAYED_RELEASE_TABLET | Freq: Every day | ORAL | Status: DC

## 2014-05-06 MED ORDER — ONDANSETRON HCL 8 MG PO TABS: 8.0000 mg | ORAL_TABLET | Freq: Three times a day (TID) | ORAL | Status: DC | PRN

## 2014-05-06 MED ORDER — ONDANSETRON HCL 4 MG PO TABS: 4.0000 mg | ORAL_TABLET | Freq: Three times a day (TID) | ORAL | Status: DC | PRN

## 2014-05-06 NOTE — Discharge Instructions (Signed)
Activity: 1.May walk up steps                2.No lifting more than ten pounds for four weeks.                 3.No driving for four weeks.                4.Stop any activity that causes chest pain, shortness of breath, dizziness, sweating or excessive weakness.                5.Avoid straining.                6.Continue with your breathing exercises daily.  Diet: Diabetic diet and Low fat, Low saltl diet  Wound Care: May shower.  Clean wounds with mild soap and water daily. Apply dry gauze and tape to RLE wound. Chang daily. If has increasing drainage, redness, or fever, call office asap.Contact the office at [phone] if any problems arise.      Coronary Artery Bypass Grafting, Care After Refer to this sheet in the next few weeks. These instructions provide you with information on caring for yourself after your procedure. Your health care provider may also give you more specific instructions. Your treatment has been planned according to current medical practices, but problems sometimes occur. Call your health care provider if you have any problems or questions after your procedure. WHAT TO EXPECT AFTER THE PROCEDURE Recovery from surgery will be different for everyone. Some people feel well after 3 or 4 weeks, while for others it takes longer. After your procedure, it is typical to have the following:  Nausea and a lack of appetite.   Constipation.  Weakness and fatigue.   Depression or irritability.   Pain or discomfort at your incision site. HOME CARE INSTRUCTIONS  Take all medicines as directed by your health care provider. Do not stop taking medicines or start any new medicines without first checking with your health care provider.  Take your pulse as directed by your health care provider.  Perform deep breathing as directed by your health care provider. If you were given a device called an incentive spirometer, use it to practice deep breathing several times a day.  Support your chest with a pillow or your arms when you take deep breaths or cough.  Keep incision areas clean, dry, and protected. Remove or change any bandages (dressings) only as directed by your health care provider. You may have skin adhesive strips over the incision areas. Do not take the strips off. They will fall off on their own.  Check incision areas daily for any swelling, redness, or drainage.  If incisions were made in your legs, do the following:  Avoid crossing your legs.   Avoid sitting for long periods of time. Change positions every 30 minutes.   Elevate your legs when you are sitting.  Wear compression stockings as directed by your health care provider. These stockings help keep blood clots from forming in your legs.  Take showers once your health care provider approves. Until then, only take sponge baths. Pat incisions dry. Do not rub incisions with a washcloth or towel. Do not bathe, swim, or use a hot tub until directed by your health care provider.  Eat foods that are high in fiber, such as raw fruits and vegetables, whole grains, beans, and nuts. Meats should be lean cut. Avoid canned, processed, and fried foods.  Drink enough fluids to keep your urine clear  or pale yellow.  Weigh yourself every day. This helps identify if you are retaining fluid that may make your heart and lungs work harder.  Rest and limit activity as directed by your health care provider. You may be instructed to:  Stop any activity at once if you have chest pain, shortness of breath, irregular heartbeats, or dizziness. Get help right away if you have any of these symptoms.  Move around frequently for short periods or take short walks as directed by your health care provider. Increase your activities gradually. You may need physical therapy or cardiac rehabilitation to help strengthen your muscles and build your endurance.  Avoid lifting, pushing, or pulling anything heavier than 10 lb (4.5  kg) for at least 6 weeks after surgery.  Do not drive until your health care provider approves.  Ask your health care provider when you may return to work.  Ask your health care provider when you may resume sexual activity.  Follow up with your health care provider as directed. SEEK MEDICAL CARE IF:  You have swelling, redness, increasing pain, or drainage at the site of an incision.  You have a fever.  You have swelling in your ankles or legs.  You have pain in your legs.   You gain 2 or more pounds (0.9 kg) a day.  You are nauseous or vomit.  You have diarrhea. SEEK IMMEDIATE MEDICAL CARE IF:  You have chest pain that goes to your jaw or arms.  You have shortness of breath.   You have a fast or irregular heartbeat.   You notice a "clicking" in your breastbone (sternum) when you move.   You have numbness or weakness in your arms or legs.  You feel dizzy or light-headed.  MAKE SURE YOU:  Understand these instructions.  Will watch your condition.  Will get help right away if you are not doing well or get worse. Document Released: 04/22/2005 Document Revised: 10/08/2013 Document Reviewed: 03/12/2013 [facility] Patient Information 2015 [place], [place]. This information is not intended to replace advice given to you by your health care provider. Make sure you discuss any questions you have with your health care provider.

## 2014-05-06 NOTE — Progress Notes (Signed)
[id number] Completed brief ed with pt re sternal precautions, IS, diet and ex. Pt with eyes closed and grimacing at times. Asked him if he was in pain and he said just tired and sick. Ready to go home. Not sure pt will be compliant with ed. Left heart healthy and diabetic diets so he can watch carbs. Stated he eats canned foods. Discussed with pt that canned foods have sodium. Declined CRP 2.  Discussed smoking cessation and gave handout.Luetta Nutting RN BSN 05/06/2014 9:09 AM

## 2014-05-06 NOTE — Progress Notes (Signed)
Pt/family given discharge instructions, medication lists, follow up appointments, and when to call the doctor.  Pt/family verbalizes understanding. Pt given signs and symptoms of infection. Arleatha Philipps McClintock, RN    

## 2014-05-06 NOTE — Progress Notes (Addendum)
      [address]       [place] [address]             [phone]        5 Days Post-Op Procedure(s) (LRB): CORONARY ARTERY BYPASS GRAFTING (CABG) (N/A) INTRAOPERATIVE TRANSESOPHAGEAL ECHOCARDIOGRAM (N/A)  Subjective: Patient's only complaint is unable to sleep-Xanax really helps.  Objective: Vital signs in last 24 hours: Temp:  [97.4 F (36.3 C)-97.6 F (36.4 C)] 97.4 F (36.3 C) (07/21 0437) Pulse Rate:  [60-70] 66 (07/21 0437) Cardiac Rhythm:  [-] Sinus bradycardia (07/20 2100) Resp:  [18] 18 (07/21 0437) BP: (106-127)/(61-76) 127/72 mmHg (07/21 0437) SpO2:  [94 %-97 %] 97 % (07/21 0437) Weight:  [206 lb 5.6 oz (93.6 kg)] 206 lb 5.6 oz (93.6 kg) (07/21 0437)  Pre op weight 93 kg Current Weight  05/06/14 206 lb 5.6 oz (93.6 kg)     07/20 0701 - 07/21 0700 In: 720 [P.O.:720] Out: -   Physical Exam:  Cardiovascular: RRR, no murmurs, gallops, or rubs. Pulmonary: Clear to auscultation bilaterally; no rales, wheezes, or rhonchi. Abdomen: Soft, non tender, bowel sounds present. Extremities: Mild bilateral lower extremity edema R>L. Wounds: Sternal wound is clean and dry.  No erythema or signs of infection. RLE wound with sero sanguinous ooze.  Lab Results: CBC:  Recent Labs  05/04/14 0330  WBC 12.0*  HGB 10.1*  HCT 29.9*  PLT 113*   BMET:   Recent Labs  05/04/14 0330  NA 136*  K 4.2  CL 92*  CO2 27  GLUCOSE 117*  BUN 21  CREATININE 0.95  CALCIUM 8.6    PT/INR:  Lab Results  Component Value Date   INR 1.21 05/01/2014   INR 0.98 05/01/2014   INR 1.0 04/23/2014   ABG:  INR: Will add last result for INR, ABG once components are confirmed Will add last 4 CBG results once components are confirmed  Assessment/Plan:  1. CV - SR in the 60-70's. On Coreg 3.125 bid. Was on Spironolactone and very low dose Ramipril pre op. Will not restart at this time. 2.  Pulmonary - Encourage incentive spirometer 3. Volume Overload - On Lasix  40 daily. Is at pre op weight. Will not need at discharge. 4.  Acute blood loss anemia - H and H yesterday stable at 10.1 and 29.9. 5. Thrombocytopenia-platelets up to 113,000. 6. Patient requesting Percocet for pain and Zofran in case has nausea. Also, would like prescription for Xanax. Prescriptions given for the above, as discussed with Dr. Laneta Simmers. 7. Discharge   ZIMMERMAN,DONIELLE MPA-C 05/06/2014,7:35 AM

## 2014-05-09 ENCOUNTER — Ambulatory Visit (INDEPENDENT_AMBULATORY_CARE_PROVIDER_SITE_OTHER): Payer: Self-pay | Admitting: Surgery

## 2014-05-09 ENCOUNTER — Other Ambulatory Visit: Payer: Self-pay | Admitting: *Deleted

## 2014-05-09 ENCOUNTER — Encounter: Payer: Self-pay | Admitting: *Deleted

## 2014-05-09 ENCOUNTER — Encounter: Payer: Self-pay | Admitting: Surgery

## 2014-05-09 DIAGNOSIS — B999 Unspecified infectious disease: Secondary | ICD-10-CM

## 2014-05-09 DIAGNOSIS — R600 Localized edema: Secondary | ICD-10-CM

## 2014-05-09 DIAGNOSIS — Z951 Presence of aortocoronary bypass graft: Secondary | ICD-10-CM

## 2014-05-09 DIAGNOSIS — G8918 Other acute postprocedural pain: Secondary | ICD-10-CM

## 2014-05-09 MED ORDER — CEPHALEXIN 500 MG PO CAPS: 500.0000 mg | ORAL_CAPSULE | Freq: Three times a day (TID) | ORAL | Status: DC

## 2014-05-09 MED ORDER — OXYCODONE-ACETAMINOPHEN 5-325 MG PO TABS: 1.0000 | ORAL_TABLET | ORAL | Status: DC | PRN

## 2014-05-09 MED ORDER — FUROSEMIDE 40 MG PO TABS: 40.0000 mg | ORAL_TABLET | Freq: Every day | ORAL | Status: DC

## 2014-05-09 MED ORDER — HYDROMORPHONE HCL 2 MG PO TABS: 2.0000 mg | ORAL_TABLET | ORAL | Status: DC | PRN

## 2014-05-09 MED ORDER — POTASSIUM CHLORIDE CRYS ER 10 MEQ PO TBCR: 20.0000 meq | EXTENDED_RELEASE_TABLET | Freq: Every day | ORAL | Status: DC

## 2014-05-09 NOTE — Progress Notes (Signed)
HPI:  The patient returns today for suture removal and was noted to have dehiscence of his right leg vein harvest incision with serosanguinous dainage and moderate swelling of the right leg. He denies any fever or chills. He has been taking oxycodone for pain but it is causing a lot of itching. He has a hx of codeine allergy with itching but was given oxycodone in the hospital. He never complained of itching in the hospital but says that it was giving him itching at that time also.  Current Outpatient Prescriptions  Medication Sig Dispense Refill  . albuterol (PROVENTIL HFA;VENTOLIN HFA) 108 (90 BASE) MCG/ACT inhaler Inhale 1-2 puffs into the lungs every 6 (six) hours as needed for wheezing or shortness of breath.      . ALPRAZolam (XANAX) 0.5 MG tablet Take 1 tablet (0.5 mg total) by mouth 2 (two) times daily as needed for anxiety or sleep.  20 tablet  0  . aspirin EC 325 MG EC tablet Take 1 tablet (325 mg total) by mouth daily.  30 tablet  0  . carvedilol (COREG) 3.125 MG tablet Take 3.125 mg by mouth 2 (two) times daily with a meal.      . cephALEXin (KEFLEX) 500 MG capsule Take 1 capsule (500 mg total) by mouth 3 (three) times daily.  21 capsule  0  . colchicine 0.6 MG tablet Take 0.6 mg by mouth daily as needed (for gout).      . furosemide (LASIX) 40 MG tablet Take 1 tablet (40 mg total) by mouth daily. Take one tablet daily  14 tablet  0  . HYDROmorphone (DILAUDID) 2 MG tablet Take 1 tablet (2 mg total) by mouth every 4 (four) hours as needed for severe pain.  50 tablet  0  . ondansetron (ZOFRAN) 4 MG tablet Take 1 tablet (4 mg total) by mouth every 8 (eight) hours as needed for nausea or vomiting.  20 tablet  0  . oxyCODONE-acetaminophen (PERCOCET) 5-325 MG per tablet Take 1 tablet by mouth every 4 (four) hours as needed for severe pain.  40 tablet  0  . pantoprazole (PROTONIX) 40 MG tablet Take 40 mg by mouth daily.      . potassium chloride (K-DUR,KLOR-CON) 10 MEQ tablet Take 2  tablets (20 mEq total) by mouth daily.  28 tablet  1  . rosuvastatin (CRESTOR) 20 MG tablet Take 20 mg by mouth daily.       No current facility-administered medications for this visit.     Physical Exam: There were no vitals taken for this visit. He is in no distress. Lungs are clear Cardiac exam shows a regular rate and rhythm with normal heart sounds The right leg vein harvest incision is open. There is a small amount of serosanguinous drainage but no sign of infection. There is moderate swelling in the right lower leg and foot with ecchymosis down to the foot. There is trace edema in the left foot.  Diagnostic Tests: none  Impression:  He has had dehiscence of the small vein harvest incision. He will need to keep a dry dressing over this until it heals. I will put him on Keflex 500 mg tid for the next week to prevent infection. He does have a moderate amount of edema in that leg so I will put him on lasix 40 mg daily and KCL 20 meq daily for the next two weeks. I will switch his pain med to Dilaudid 2 mg po every 4  hrs as needed for pain.  Plan:  I will see him back in 2 weeks to check his incision.

## 2014-05-13 ENCOUNTER — Other Ambulatory Visit: Payer: Self-pay | Admitting: Surgery

## 2014-05-14 ENCOUNTER — Telehealth: Payer: Self-pay | Admitting: *Deleted

## 2014-05-14 DIAGNOSIS — G8918 Other acute postprocedural pain: Secondary | ICD-10-CM

## 2014-05-14 MED ORDER — OXYCODONE-ACETAMINOPHEN 5-325 MG PO TABS: 1.0000 | ORAL_TABLET | ORAL | Status: DC | PRN

## 2014-05-14 NOTE — Telephone Encounter (Signed)
Mr. Oberlin son called and said that some med is making his father sick and he needs more pain med.  I called Mr. Springer and discussed this with him.  He said he started getting sick after he started the antibiotics. I told him to stop taking them.  He agreed. He said his leg is less swollen.  He said the Dilaudid did not help his pain and is requesting Oxycodone. He was vague regarding the itching he has had with it before. A new script will be available at the office today. He understood.

## 2014-05-16 ENCOUNTER — Ambulatory Visit: Payer: Medicaid Other | Admitting: Cardiovascular Disease

## 2014-05-18 ENCOUNTER — Encounter (HOSPITAL_COMMUNITY): Payer: Self-pay | Admitting: Emergency Medicine

## 2014-05-18 ENCOUNTER — Inpatient Hospital Stay (HOSPITAL_COMMUNITY)
Admission: EM | Admit: 2014-05-18 | Discharge: 2014-05-20 | DRG: 863 | Disposition: A | Discharge status: Home / Self Care | Payer: Medicaid Other | Attending: Thoracic Surgery (Cardiothoracic Vascular Surgery) | Admitting: Thoracic Surgery (Cardiothoracic Vascular Surgery)

## 2014-05-18 DIAGNOSIS — Y831 Surgical operation with implant of artificial internal device as the cause of abnormal reaction of the patient, or of later complication, without mention of misadventure at the time of the procedure: Secondary | ICD-10-CM | POA: Diagnosis present

## 2014-05-18 DIAGNOSIS — Y9269 Other specified industrial and construction area as the place of occurrence of the external cause: Secondary | ICD-10-CM | POA: Diagnosis not present

## 2014-05-18 DIAGNOSIS — G8918 Other acute postprocedural pain: Secondary | ICD-10-CM

## 2014-05-18 DIAGNOSIS — Z7982 Long term (current) use of aspirin: Secondary | ICD-10-CM | POA: Diagnosis not present

## 2014-05-18 DIAGNOSIS — I252 Old myocardial infarction: Secondary | ICD-10-CM | POA: Diagnosis not present

## 2014-05-18 DIAGNOSIS — Z888 Allergy status to other drugs, medicaments and biological substances status: Secondary | ICD-10-CM | POA: Diagnosis not present

## 2014-05-18 DIAGNOSIS — L03119 Cellulitis of unspecified part of limb: Secondary | ICD-10-CM

## 2014-05-18 DIAGNOSIS — Z8249 Family history of ischemic heart disease and other diseases of the circulatory system: Secondary | ICD-10-CM

## 2014-05-18 DIAGNOSIS — Z79899 Other long term (current) drug therapy: Secondary | ICD-10-CM

## 2014-05-18 DIAGNOSIS — R110 Nausea: Secondary | ICD-10-CM | POA: Diagnosis not present

## 2014-05-18 DIAGNOSIS — M109 Gout, unspecified: Secondary | ICD-10-CM | POA: Diagnosis present

## 2014-05-18 DIAGNOSIS — I2589 Other forms of chronic ischemic heart disease: Secondary | ICD-10-CM | POA: Diagnosis present

## 2014-05-18 DIAGNOSIS — L0390 Cellulitis, unspecified: Secondary | ICD-10-CM | POA: Diagnosis present

## 2014-05-18 DIAGNOSIS — Z791 Long term (current) use of non-steroidal anti-inflammatories (NSAID): Secondary | ICD-10-CM

## 2014-05-18 DIAGNOSIS — T8140XA Infection following a procedure, unspecified, initial encounter: Principal | ICD-10-CM | POA: Diagnosis present

## 2014-05-18 DIAGNOSIS — L02419 Cutaneous abscess of limb, unspecified: Secondary | ICD-10-CM | POA: Diagnosis present

## 2014-05-18 DIAGNOSIS — I2510 Atherosclerotic heart disease of native coronary artery without angina pectoris: Secondary | ICD-10-CM | POA: Diagnosis present

## 2014-05-18 DIAGNOSIS — Z9861 Coronary angioplasty status: Secondary | ICD-10-CM

## 2014-05-18 DIAGNOSIS — K219 Gastro-esophageal reflux disease without esophagitis: Secondary | ICD-10-CM | POA: Diagnosis present

## 2014-05-18 DIAGNOSIS — L03115 Cellulitis of right lower limb: Secondary | ICD-10-CM

## 2014-05-18 DIAGNOSIS — I10 Essential (primary) hypertension: Secondary | ICD-10-CM | POA: Diagnosis present

## 2014-05-18 DIAGNOSIS — Z951 Presence of aortocoronary bypass graft: Secondary | ICD-10-CM

## 2014-05-18 LAB — COMPREHENSIVE METABOLIC PANEL
ALK PHOS: 100 U/L (ref 39–117)
ALT: 38 U/L (ref 0–53)
AST: 26 U/L (ref 0–37)
Albumin: 2.9 g/dL — ABNORMAL LOW (ref 3.5–5.2)
Anion gap: 13 (ref 5–15)
BUN: 20 mg/dL (ref 6–23)
CHLORIDE: 105 meq/L (ref 96–112)
CO2: 21 meq/L (ref 19–32)
Calcium: 8.9 mg/dL (ref 8.4–10.5)
Creatinine, Ser: 1.08 mg/dL (ref 0.50–1.35)
GFR calc Af Amer: 84 mL/min — ABNORMAL LOW (ref >90)
GFR, EST NON AFRICAN AMERICAN: 72 mL/min — AB (ref >90)
Glucose, Bld: 112 mg/dL — ABNORMAL HIGH (ref 70–99)
POTASSIUM: 4 meq/L (ref 3.7–5.3)
SODIUM: 139 meq/L (ref 137–147)
Total Bilirubin: 0.2 mg/dL — ABNORMAL LOW (ref 0.3–1.2)
Total Protein: 7.1 g/dL (ref 6.0–8.3)

## 2014-05-18 LAB — CBC WITH DIFFERENTIAL/PLATELET
BASOS ABS: 0 10*3/uL (ref 0.0–0.1)
Basophils Relative: 0 % (ref 0–1)
EOS PCT: 4 % (ref 0–5)
Eosinophils Absolute: 0.3 10*3/uL (ref 0.0–0.7)
HCT: 29.8 % — ABNORMAL LOW (ref 39.0–52.0)
Hemoglobin: 9.4 g/dL — ABNORMAL LOW (ref 13.0–17.0)
LYMPHS ABS: 1.7 10*3/uL (ref 0.7–4.0)
LYMPHS PCT: 24 % (ref 12–46)
MCH: 29.8 pg (ref 26.0–34.0)
MCHC: 31.5 g/dL (ref 30.0–36.0)
MCV: 94.6 fL (ref 78.0–100.0)
Monocytes Absolute: 0.7 10*3/uL (ref 0.1–1.0)
Monocytes Relative: 9 % (ref 3–12)
NEUTROS ABS: 4.4 10*3/uL (ref 1.7–7.7)
NEUTROS PCT: 63 % (ref 43–77)
PLATELETS: 344 10*3/uL (ref 150–400)
RBC: 3.15 MIL/uL — ABNORMAL LOW (ref 4.22–5.81)
RDW: 14.4 % (ref 11.5–15.5)
WBC: 7.1 10*3/uL (ref 4.0–10.5)

## 2014-05-18 LAB — I-STAT CG4 LACTIC ACID, ED: Lactic Acid, Venous: 0.75 mmol/L (ref 0.5–2.2)

## 2014-05-18 MED ORDER — PANTOPRAZOLE SODIUM 40 MG PO TBEC
40.0000 mg | DELAYED_RELEASE_TABLET | Freq: Every day | ORAL | Status: DC
  Administered 2014-05-18 – 2014-05-20 (×3): 40 mg via ORAL
  Filled 2014-05-18 (×3): qty 1

## 2014-05-18 MED ORDER — ALBUTEROL SULFATE HFA 108 (90 BASE) MCG/ACT IN AERS: 1.0000 | INHALATION_SPRAY | Freq: Four times a day (QID) | RESPIRATORY_TRACT | Status: DC | PRN

## 2014-05-18 MED ORDER — DIPHENHYDRAMINE HCL 25 MG PO CAPS
25.0000 mg | ORAL_CAPSULE | Freq: Three times a day (TID) | ORAL | Status: DC | PRN
  Administered 2014-05-19 – 2014-05-20 (×2): 25 mg via ORAL
  Filled 2014-05-18 (×2): qty 1

## 2014-05-18 MED ORDER — ACETAMINOPHEN 325 MG PO TABS
650.0000 mg | ORAL_TABLET | Freq: Four times a day (QID) | ORAL | Status: DC | PRN
Start: 2014-05-18 — End: 2014-05-20
  Administered 2014-05-19 – 2014-05-20 (×2): 650 mg via ORAL
  Filled 2014-05-18 (×2): qty 2

## 2014-05-18 MED ORDER — ONDANSETRON HCL 4 MG/2ML IJ SOLN: 4.0000 mg | Freq: Three times a day (TID) | INTRAMUSCULAR | Status: AC | PRN

## 2014-05-18 MED ORDER — OXYCODONE HCL 5 MG PO TABS
5.0000 mg | ORAL_TABLET | ORAL | Status: DC | PRN
  Administered 2014-05-18: 5 mg via ORAL
  Filled 2014-05-18: qty 1

## 2014-05-18 MED ORDER — ALPRAZOLAM 0.5 MG PO TABS
0.5000 mg | ORAL_TABLET | Freq: Every evening | ORAL | Status: DC | PRN
  Administered 2014-05-18 – 2014-05-19 (×2): 0.5 mg via ORAL
  Filled 2014-05-18 (×2): qty 1

## 2014-05-18 MED ORDER — ALBUTEROL SULFATE (2.5 MG/3ML) 0.083% IN NEBU: 2.5000 mg | INHALATION_SOLUTION | Freq: Four times a day (QID) | RESPIRATORY_TRACT | Status: DC | PRN

## 2014-05-18 MED ORDER — CARVEDILOL 3.125 MG PO TABS
3.1250 mg | ORAL_TABLET | Freq: Two times a day (BID) | ORAL | Status: DC
  Administered 2014-05-19 – 2014-05-20 (×3): 3.125 mg via ORAL
  Filled 2014-05-18 (×6): qty 1

## 2014-05-18 MED ORDER — CLINDAMYCIN PHOSPHATE 600 MG/50ML IV SOLN
600.0000 mg | Freq: Once | INTRAVENOUS | Status: AC
  Administered 2014-05-18: 600 mg via INTRAVENOUS
  Filled 2014-05-18: qty 50

## 2014-05-18 MED ORDER — VANCOMYCIN HCL 10 G IV SOLR
1250.0000 mg | Freq: Two times a day (BID) | INTRAVENOUS | Status: DC
  Administered 2014-05-18 – 2014-05-19 (×2): 1250 mg via INTRAVENOUS
  Filled 2014-05-18 (×3): qty 1250

## 2014-05-18 MED ORDER — GI COCKTAIL ~~LOC~~
30.0000 mL | Freq: Three times a day (TID) | ORAL | Status: DC | PRN
  Administered 2014-05-20: 30 mL via ORAL
  Filled 2014-05-18: qty 30

## 2014-05-18 MED ORDER — ATORVASTATIN CALCIUM 40 MG PO TABS
40.0000 mg | ORAL_TABLET | Freq: Every day | ORAL | Status: DC
Start: 2014-05-19 — End: 2014-05-20
  Administered 2014-05-19: 40 mg via ORAL
  Filled 2014-05-18 (×2): qty 1

## 2014-05-18 MED ORDER — OXYCODONE HCL 5 MG PO TABS
10.0000 mg | ORAL_TABLET | ORAL | Status: DC | PRN
  Administered 2014-05-19 (×2): 10 mg via ORAL
  Filled 2014-05-18 (×2): qty 2

## 2014-05-18 MED ORDER — ASPIRIN EC 325 MG PO TBEC
325.0000 mg | DELAYED_RELEASE_TABLET | Freq: Every day | ORAL | Status: DC
  Administered 2014-05-19 – 2014-05-20 (×2): 325 mg via ORAL
  Filled 2014-05-18 (×2): qty 1

## 2014-05-18 NOTE — ED Notes (Signed)
Pt reports he had open heart surgery 2 week ago, had veins taken from right calf area. Pt reports he did not realize till 3 days ago that his leg wound had opened up, at present wound has yellow slough present and is draining. Pt denies pain. Daughter reports pt has not been feeling well all week.

## 2014-05-18 NOTE — ED Provider Notes (Signed)
I saw and evaluated the patient, reviewed the resident's note and I agree with the findings and plan.   EKG Interpretation None      63 yo male with recent CABG with right saphenous vein harvesting presenting with redness around right leg wound with swelling and increased foul smelling drainage.  Subjective fevers at home.  On exam, well appearing, nontoxic, not distressed, normal respiratory effort, normal perfusion, right leg with 1+ edema, open wound to proximal leg with mild surrounding erythema.  IV clinda given.  Admitted for further IV abx.  Clinical Impression: 1. Cellulitis of right lower extremity       Candyce Churn III, MD 05/18/14 2256

## 2014-05-18 NOTE — ED Provider Notes (Signed)
I saw and evaluated the patient, reviewed the resident's note and I agree with the findings and plan.   EKG Interpretation None        Candyce Churn III, MD 05/18/14 2256

## 2014-05-18 NOTE — ED Provider Notes (Signed)
CSN: [id number]     Arrival date & time 05/18/14  1614 History   First MD Initiated Contact with Patient 05/18/14 1646     Chief Complaint  Patient presents with  . post cardiac surgery, leg infection      (Consider location/radiation/quality/duration/timing/severity/associated sxs/prior Treatment) Patient is a 63 y.o. male presenting with general illness. The history is provided by the patient.  Illness Severity:  Moderate Onset quality:  Gradual Duration:  2 days Timing:  Constant Progression:  Unchanged Chronicity:  New Associated symptoms: fever   Associated symptoms: no abdominal pain, no chest pain, no congestion, no diarrhea, no headaches, no myalgias, no nausea, no rash, no shortness of breath and no vomiting    63yo M with a chief complaint of a right calf wound. Patient had a CABG done on the 16th by Dr. Laneta Simmers, patient was discharged home on antibiotics but was unable to take them secondary to nausea. Patient was switched to Keflex but also had nausea with that medicine. Patient had recent followup on Friday was evaluated by his cardiothoracic surgeon with a clean bill of health. Patient then noticed a foul-smelling discharge from his leg yesterday. This continued throughout today patient also noticed some fevers and chills. Patient denies any nausea vomiting. Patient denies any chest pain or shortness of breath.   Past Medical History  Diagnosis Date  . Coronary artery disease     DES proximal LAD  . GERD (gastroesophageal reflux disease)   . MI (mitral incompetence)     anterolateral MI October 2014  . HTN (hypertension)   . Anginal pain   . Myocardial infarction   . Shortness of breath   . Arthritis     OSTEO  . Gout    Past Surgical History  Procedure Laterality Date  . Coronary stent placement  2000, 2014  . Bilateral shoulder arthroscopy    . Coronary artery bypass graft N/A 05/01/2014    Procedure: CORONARY ARTERY BYPASS GRAFTING (CABG);  Surgeon: Alleen Borne, MD;  Location: [facility] OR;  Service: Open Heart Surgery;  Laterality: N/A;  Times 5 using left internal mammary artery and endoscopically harvested right saphenous vein  . Intraoperative transesophageal echocardiogram N/A 05/01/2014    Procedure: INTRAOPERATIVE TRANSESOPHAGEAL ECHOCARDIOGRAM;  Surgeon: Alleen Borne, MD;  Location: [facility] OR;  Service: Open Heart Surgery;  Laterality: N/A;   Family History  Problem Relation Age of Onset  . CAD Father   . Heart attack Brother 44    x 3  . Heart attack Father 71   History  Substance Use Topics  . Smoking status: Current Some Day Smoker -- 0.50 packs/day for 54 years    Types: Cigarettes  . Smokeless tobacco: Never Used  . Alcohol Use: 7.0 oz/week    14 drink(s) per week    Review of Systems  Constitutional: Positive for fever. Negative for chills.  HENT: Negative for congestion and facial swelling.   Eyes: Negative for discharge and visual disturbance.  Respiratory: Negative for shortness of breath.   Cardiovascular: Negative for chest pain and palpitations.  Gastrointestinal: Negative for nausea, vomiting, abdominal pain and diarrhea.  Musculoskeletal: Negative for arthralgias and myalgias.  Skin: Positive for wound (R calf). Negative for color change and rash.  Neurological: Negative for tremors, syncope and headaches.  Psychiatric/Behavioral: Negative for confusion and dysphoric mood.      Allergies  Codeine and Oxycodone  Home Medications   Prior to Admission medications   Medication Sig Start Date  End Date Taking? Authorizing Provider  albuterol (PROVENTIL HFA;VENTOLIN HFA) 108 (90 BASE) MCG/ACT inhaler Inhale 1-2 puffs into the lungs every 6 (six) hours as needed for wheezing or shortness of breath.   Yes Historical Provider, MD  aspirin EC 325 MG EC tablet Take 1 tablet (325 mg total) by mouth daily. 05/06/14  Yes Donielle Margaretann LovelessM Zimmerman, PA-C  carvedilol (COREG) 3.125 MG tablet Take 3.125 mg by mouth 2 (two) times daily  with a meal.   Yes Historical Provider, MD  oxyCODONE-acetaminophen (PERCOCET) 5-325 MG per tablet Take 1 tablet by mouth every 4 (four) hours as needed for severe pain. 05/14/14  Yes Kerin PernaPeter Van Trigt, MD  pantoprazole (PROTONIX) 40 MG tablet Take 40 mg by mouth daily.   Yes Historical Provider, MD  rosuvastatin (CRESTOR) 20 MG tablet Take 20 mg by mouth daily.   Yes Historical Provider, MD   BP 147/93  Pulse 68  Temp(Src) 97.9 F (36.6 C) (Oral)  Resp 20  Ht 5\' 7"  (1.702 m)  Wt 199 lb 1.6 oz (90.311 kg)  BMI 31.18 kg/m2  SpO2 99% Physical Exam  Constitutional: He is oriented to person, place, and time. He appears well-developed and well-nourished.  HENT:  Head: Normocephalic and atraumatic.  Eyes: EOM are normal. Pupils are equal, round, and reactive to light.  Neck: Normal range of motion. Neck supple. No JVD present.  Cardiovascular: Normal rate and regular rhythm.  Exam reveals no gallop and no friction rub.   No murmur heard. Pulmonary/Chest: No respiratory distress. He has no wheezes.  Abdominal: He exhibits no distension. There is no rebound and no guarding.  Musculoskeletal: Normal range of motion.  Neurological: He is alert and oriented to person, place, and time.  Skin: No rash noted. No pallor.  Right calf surgical wound opening with purulent drainage. Induration noted about the size of a small salad plate. Pulse motor and sensation intact distally. No noted tenderness to the area.  Psychiatric: He has a normal mood and affect. His behavior is normal.    ED Course  Procedures (including critical care time) Labs Review Labs Reviewed  CBC WITH DIFFERENTIAL - Abnormal; Notable for the following:    RBC 3.15 (*)    Hemoglobin 9.4 (*)    HCT 29.8 (*)    All other components within normal limits  COMPREHENSIVE METABOLIC PANEL - Abnormal; Notable for the following:    Glucose, Bld 112 (*)    Albumin 2.9 (*)    Total Bilirubin 0.2 (*)    GFR calc non Af Amer 72 (*)     GFR calc Af Amer 84 (*)    All other components within normal limits  CULTURE, BLOOD (ROUTINE X 2)  CULTURE, BLOOD (ROUTINE X 2)  CBC  BASIC METABOLIC PANEL  I-STAT CG4 LACTIC ACID, ED    Imaging Review No results found.   EKG Interpretation None      MDM   Final diagnoses:  Cellulitis of right lower extremity    Patient is a 63 y.o. male who presents with R LE cellulitis.  This started yesterday. Patient with some fevers and chills at home. Patient with stable vital signs here. Will obtain baseline labs blood cultures give IV clindamycin. Consult CT surgery.   Will admit to [facility], transfer patient.  Patient aware and agrees.     Melene Planan Adrieana Fennelly, MD 05/18/14 [phone]

## 2014-05-18 NOTE — H&P (Signed)
Peter Golden Sr. is an 63 y.o. male.     Chief Complaint: increased pain and swelling right leg   HPI: 63 yo man with a history of HTN, CAD and ischemic cardiomyopathy who underwent CABG x 5 on 7/16. He was discharged on 7/21. He returned to the office on 7/24 with a leg wound dehiscence. He was treated with keflex, but stopped that medication due to nausea. He says that over the past 2 days he has noted increased swelling and tenderness of the skin around the wound. He has also noted purulent drainage from the wound. He has had some subjective fevers. He went to the ED at [facility] today. He is now transferred to [facility] for treatment.   He is having much less sternal pain than he was even a few days ago. He has been taking oxycodone at home even though he thinks it is making his forehead itch. Dilaudid was ineffective in relieving his pain.   Past Medical History  Diagnosis Date  . Coronary artery disease     DES proximal LAD  . GERD (gastroesophageal reflux disease)   . MI (mitral incompetence)     anterolateral MI October 2014  . HTN (hypertension)   . Anginal pain   . Myocardial infarction   . Shortness of breath   . Arthritis     OSTEO  . Gout     Past Surgical History  Procedure Laterality Date  . Coronary stent placement  2000, 2014  . Bilateral shoulder arthroscopy    . Coronary artery bypass graft N/A 05/01/2014    Procedure: CORONARY ARTERY BYPASS GRAFTING (CABG);  Surgeon: Gaye Pollack, MD;  Location: [facility];  Service: Open Heart Surgery;  Laterality: N/A;  Times 5 using left internal mammary artery and endoscopically harvested right saphenous vein  . Intraoperative transesophageal echocardiogram N/A 05/01/2014    Procedure: INTRAOPERATIVE TRANSESOPHAGEAL ECHOCARDIOGRAM;  Surgeon: Gaye Pollack, MD;  Location: [facility] OR;  Service: Open Heart Surgery;  Laterality: N/A;    Family History  Problem Relation Age of Onset  . CAD Father   . Heart attack Brother 44    x 3   . Heart attack Father 54   Social History:  reports that he has been smoking Cigarettes.  He has a 27 pack-year smoking history. He has never used smokeless tobacco. He reports that he drinks about 7 ounces of alcohol per week. He reports that he does not use illicit drugs.  Allergies:  Allergies  Allergen Reactions  . Codeine Itching  . Oxycodone Itching    Medications Prior to Admission  Medication Sig Dispense Refill  . albuterol (PROVENTIL HFA;VENTOLIN HFA) 108 (90 BASE) MCG/ACT inhaler Inhale 1-2 puffs into the lungs every 6 (six) hours as needed for wheezing or shortness of breath.      Marland Kitchen aspirin EC 325 MG EC tablet Take 1 tablet (325 mg total) by mouth daily.  30 tablet  0  . carvedilol (COREG) 3.125 MG tablet Take 3.125 mg by mouth 2 (two) times daily with a meal.      . oxyCODONE-acetaminophen (PERCOCET) 5-325 MG per tablet Take 1 tablet by mouth every 4 (four) hours as needed for severe pain.  40 tablet  0  . pantoprazole (PROTONIX) 40 MG tablet Take 40 mg by mouth daily.      . rosuvastatin (CRESTOR) 20 MG tablet Take 20 mg by mouth daily.        Results for orders placed during  the hospital encounter of 05/18/14 (from the past 48 hour(s))  CBC WITH DIFFERENTIAL     Status: Abnormal   Collection Time    05/18/14  5:21 PM      Result Value Ref Range   WBC 7.1  4.0 - 10.5 K/uL   RBC 3.15 (*) 4.22 - 5.81 MIL/uL   Hemoglobin 9.4 (*) 13.0 - 17.0 g/dL   HCT 29.8 (*) 39.0 - 52.0 %   MCV 94.6  78.0 - 100.0 fL   MCH 29.8  26.0 - 34.0 pg   MCHC 31.5  30.0 - 36.0 g/dL   RDW 14.4  11.5 - 15.5 %   Platelets 344  150 - 400 K/uL   Neutrophils Relative % 63  43 - 77 %   Neutro Abs 4.4  1.7 - 7.7 K/uL   Lymphocytes Relative 24  12 - 46 %   Lymphs Abs 1.7  0.7 - 4.0 K/uL   Monocytes Relative 9  3 - 12 %   Monocytes Absolute 0.7  0.1 - 1.0 K/uL   Eosinophils Relative 4  0 - 5 %   Eosinophils Absolute 0.3  0.0 - 0.7 K/uL   Basophils Relative 0  0 - 1 %   Basophils Absolute 0.0   0.0 - 0.1 K/uL  COMPREHENSIVE METABOLIC PANEL     Status: Abnormal   Collection Time    05/18/14  5:21 PM      Result Value Ref Range   Sodium 139  137 - 147 mEq/L   Potassium 4.0  3.7 - 5.3 mEq/L   Chloride 105  96 - 112 mEq/L   CO2 21  19 - 32 mEq/L   Glucose, Bld 112 (*) 70 - 99 mg/dL   BUN 20  6 - 23 mg/dL   Creatinine, Ser 1.08  0.50 - 1.35 mg/dL   Calcium 8.9  8.4 - 10.5 mg/dL   Total Protein 7.1  6.0 - 8.3 g/dL   Albumin 2.9 (*) 3.5 - 5.2 g/dL   AST 26  0 - 37 U/L   ALT 38  0 - 53 U/L   Alkaline Phosphatase 100  39 - 117 U/L   Total Bilirubin 0.2 (*) 0.3 - 1.2 mg/dL   GFR calc non Af Amer 72 (*) >90 mL/min   GFR calc Af Amer 84 (*) >90 mL/min   Comment: (NOTE)     The eGFR has been calculated using the CKD EPI equation.     This calculation has not been validated in all clinical situations.     eGFR's persistently <90 mL/min signify possible Chronic Kidney     Disease.   Anion gap 13  5 - 15  I-STAT CG4 LACTIC ACID, ED     Status: None   Collection Time    05/18/14  5:38 PM      Result Value Ref Range   Lactic Acid, Venous 0.75  0.5 - 2.2 mmol/L   No results found.  Review of Systems  Constitutional: Positive for fever. Negative for chills and malaise/fatigue.  Respiratory: Negative for shortness of breath and wheezing.   Cardiovascular: Positive for leg swelling.  Skin: Positive for itching (on forehead ? due to oxycodone).    Blood pressure 147/93, pulse 68, temperature 97.9 F (36.6 C), temperature source Oral, resp. rate 20, height $RemoveBe'5\' 7"'RLTEPpWfh$  (1.702 m), weight 199 lb 1.6 oz (90.311 kg), SpO2 99.00%. Physical Exam  Vitals reviewed. Constitutional: He is oriented to person, place, and time. He  appears well-developed and well-nourished. No distress.  HENT:  Head: Normocephalic and atraumatic.  Eyes: EOM are normal. Pupils are equal, round, and reactive to light.  Neck: Neck supple. No thyromegaly present.  Cardiovascular: Normal rate and regular rhythm.   Murmur  heard. Respiratory: Breath sounds normal. He has no wheezes. He has no rales.  Sternum stable, incision healing well  GI: Soft. There is no tenderness.  Musculoskeletal:  Right leg wound open with eschar. Surrounding induration and erythema ~ 6 cm diameter. Mildly tender to touch, warm  Lymphadenopathy:    He has no cervical adenopathy.  Neurological: He is alert and oriented to person, place, and time. No cranial nerve deficit.     Assessment/Plan 63 yo man with a recent CABG who has cellulitis of his right leg following saphenous vein harvest. He does not appear toxic- afebrile, normal WBC. He had noted purulent drainage from the wound as did the ED MD, but there is none presently.   He needs IV antibiotics- I will start him on Vancomycin  He may need debridement of the eschar- will be sen by Dr. Cyndia Bent tomorrow.   Heart healthy diet, activity ad lib.  HENDRICKSON,STEVEN C 05/18/2014, 7:11 PM

## 2014-05-18 NOTE — Progress Notes (Signed)
ANTIBIOTIC CONSULT NOTE - INITIAL  Pharmacy Consult for vancomycin  Indication: cellulitis  Allergies  Allergen Reactions  . Codeine Itching  . Oxycodone Itching    Patient Measurements: Height: 5\' 7"  (170.2 cm) Weight: 199 lb 1.6 oz (90.311 kg) IBW/kg (Calculated) : 66.1  Vital Signs: Temp: 97.9 F (36.6 C) (08/02 1853) Temp src: Oral (08/02 1853) BP: 147/93 mmHg (08/02 1853) Pulse Rate: 68 (08/02 1853) Intake/Output from previous day:   Intake/Output from this shift:    Labs:  Recent Labs  05/18/14 1721  WBC 7.1  HGB 9.4*  PLT 344  CREATININE 1.08   Estimated Creatinine Clearance: 77 ml/min (by C-G formula based on Cr of 1.08). No results found for this basename: VANCOTROUGH, Leodis Binet, VANCORANDOM, GENTTROUGH, GENTPEAK, GENTRANDOM, TOBRATROUGH, TOBRAPEAK, TOBRARND, AMIKACINPEAK, AMIKACINTROU, AMIKACIN,  in the last 72 hours   Microbiology: Recent Results (from the past 720 hour(s))  SURGICAL PCR SCREEN     Status: None   Collection Time    04/30/14  8:53 PM      Result Value Ref Range Status   MRSA, PCR NEGATIVE  NEGATIVE Final   Staphylococcus aureus NEGATIVE  NEGATIVE Final   Comment:            The Xpert SA Assay (FDA     approved for NASAL specimens     in patients over 61 years of age),     is one component of     a comprehensive surveillance     program.  Test performance has     been validated by [employer] for patients greater     than or equal to 53 year old.     It is not intended     to diagnose infection nor to     guide or monitor treatment.    Medical History: Past Medical History  Diagnosis Date  . Coronary artery disease     DES proximal LAD  . GERD (gastroesophageal reflux disease)   . MI (mitral incompetence)     anterolateral MI October 2014  . HTN (hypertension)   . Anginal pain   . Myocardial infarction   . Shortness of breath   . Arthritis     OSTEO  . Gout     Medications:  See EMR  Assessment: 63 year  old male presented to [facility] with right calf pain, s/p CABG 7/16. Patient was discharged home on abx but unable to take them d/t nausea. Patient noticed a foul smell from leg yesterday, transferred to [facility] and orders to start IV vancomycin. No fevers, wbc normal, renal function normal. Blood cultures ordered.  Goal of Therapy:  Vancomycin trough level 10-15 mcg/ml  Plan:  Measure antibiotic drug levels at steady state Follow up culture results Vancomycin 1250 q12 hours  Sheppard Coil PharmD., BCPS Clinical Pharmacist Pager [phone] 05/18/2014 7:13 PM

## 2014-05-19 ENCOUNTER — Encounter (HOSPITAL_COMMUNITY): Payer: Self-pay | Admitting: *Deleted

## 2014-05-19 MED ORDER — IBUPROFEN 600 MG PO TABS
600.0000 mg | ORAL_TABLET | Freq: Four times a day (QID) | ORAL | Status: DC | PRN
  Administered 2014-05-19 (×2): 600 mg via ORAL
  Filled 2014-05-19 (×2): qty 1

## 2014-05-19 MED ORDER — ONDANSETRON HCL 4 MG/2ML IJ SOLN
4.0000 mg | Freq: Four times a day (QID) | INTRAMUSCULAR | Status: DC | PRN
  Administered 2014-05-19: 4 mg via INTRAVENOUS

## 2014-05-19 MED ORDER — ENOXAPARIN SODIUM 40 MG/0.4ML ~~LOC~~ SOLN
40.0000 mg | SUBCUTANEOUS | Status: DC
  Administered 2014-05-19 – 2014-05-20 (×2): 40 mg via SUBCUTANEOUS
  Filled 2014-05-19 (×2): qty 0.4

## 2014-05-19 MED ORDER — ONDANSETRON HCL 4 MG/2ML IJ SOLN
INTRAMUSCULAR | Status: AC
  Filled 2014-05-19: qty 2

## 2014-05-19 MED ORDER — VANCOMYCIN HCL IN DEXTROSE 1-5 GM/200ML-% IV SOLN
1000.0000 mg | Freq: Two times a day (BID) | INTRAVENOUS | Status: DC
  Administered 2014-05-19: 1000 mg via INTRAVENOUS
  Filled 2014-05-19 (×3): qty 200

## 2014-05-19 MED ORDER — DOCUSATE SODIUM 100 MG PO CAPS
100.0000 mg | ORAL_CAPSULE | Freq: Every day | ORAL | Status: DC
  Administered 2014-05-19: 100 mg via ORAL
  Filled 2014-05-19 (×2): qty 1

## 2014-05-19 NOTE — Care Management Note (Signed)
    Page 1 of 1   05/20/2014     11:53:51 AM CARE MANAGEMENT NOTE 05/20/2014  Patient:  Peter Booker, Peter Booker   Account Number:  [id number]  Date Initiated:  05/19/2014  Documentation initiated by:  Natalee Tomkiewicz  Subjective/Objective Assessment:   Pt adm on 05/18/14 with cellulitis to Rt leg vein harvest site, s/p recent CABG.  PTA, pt independent, lives alone.     Action/Plan:   Will follow for dc needs as pt progresses.   Anticipated DC Date:  05/22/2014   Anticipated DC Plan:  HOME W HOME HEALTH SERVICES      DC Planning Services  CM consult      [facility] Choice  HOME HEALTH   Choice offered to / List presented to:  C-1 Patient        HH arranged  HH-1 RN      [facility] agency  [facility]   Status of service:  Completed, signed off Medicare Important Message given?   (If response is "NO", the following Medicare IM given date fields will be blank) Date Medicare IM given:   Medicare IM given by:   Date Additional Medicare IM given:   Additional Medicare IM given by:    Discharge Disposition:  HOME W HOME HEALTH SERVICES  Per UR Regulation:  Reviewed for med. necessity/level of care/duration of stay  If discussed at Long Length of Stay Meetings, dates discussed:    Comments:  05/20/14 Sidney Ace, RN, BSN [phone] Pt for dc home today; will need [facility] for wound care. Referral to [facility], per pt choice.  Start of care 24-48h post dc date.  Best contact #, per pt is [phone].

## 2014-05-19 NOTE — Progress Notes (Signed)
  Subjective:   Says his leg feels fine. Had some nausea this am. Bowels working  Objective: Vital signs in last 24 hours: Temp:  [97.9 F (36.6 C)-98.1 F (36.7 C)] 98.1 F (36.7 C) (08/03 0346) Pulse Rate:  [63-68] 63 (08/03 0346) Cardiac Rhythm:  [-]  Resp:  [20] 20 (08/03 0346) BP: (97-147)/(66-93) 97/85 mmHg (08/03 0346) SpO2:  [98 %-99 %] 98 % (08/03 0346) Weight:  [90 kg (198 lb 6.6 oz)-93.6 kg (206 lb 5.6 oz)] 90 kg (198 lb 6.6 oz) (08/03 0346)  Hemodynamic parameters for last 24 hours:    Intake/Output from previous day: 08/02 0701 - 08/03 0700 In: 600 [P.O.:600] Out: 200 [Urine:200] Intake/Output this shift:    General appearance: alert, cooperative and seems a little loopy Neurologic: intact Heart: regular rate and rhythm, S1, S2 normal, no murmur, click, rub or gallop Lungs: clear to auscultation bilaterally Abdomen: soft, non-tender; bowel sounds normal; no masses,  no organomegaly Extremities: edema mild on right Wound: wound has some fibrinous and purulent debris that was excised. No significant erythema.  Lab Results:  Recent Labs  05/18/14 1721  WBC 7.1  HGB 9.4*  HCT 29.8*  PLT 344   BMET:  Recent Labs  05/18/14 1721  NA 139  K 4.0  CL 105  CO2 21  GLUCOSE 112*  BUN 20  CREATININE 1.08  CALCIUM 8.9    PT/INR: No results found for this basename: LABPROT, INR,  in the last 72 hours ABG    Component Value Date/Time   PHART 7.375 05/01/2014 1819   HCO3 22.7 05/01/2014 1819   TCO2 23 05/02/2014 1704   ACIDBASEDEF 2.0 05/01/2014 1819   O2SAT 95.0 05/01/2014 1819   CBG (last 3)  No results found for this basename: GLUCAP,  in the last 72 hours  Assessment/Plan:  The vein harvest wound required some debridement today. I think it would be best to continue Vancomycin and start bid wet to dry dressings. He has had itching and some nausea that could be related to oxycodone so will stop narcotics and use ibuprofen for pain since his renal  function is normal. Continue mobilization, lovenox for DVT prophylaxis.   LOS: 1 day    Arsal Tappan K 05/19/2014

## 2014-05-19 NOTE — Progress Notes (Signed)
Dressing changed per MD orders; wet to dry dressing with 2X2 gauze covered with 4X4.  Hermina Barters, RN

## 2014-05-20 MED ORDER — IBUPROFEN 600 MG PO TABS: 600.0000 mg | ORAL_TABLET | Freq: Four times a day (QID) | ORAL | Status: DC | PRN

## 2014-05-20 MED ORDER — OXYCODONE-ACETAMINOPHEN 5-325 MG PO TABS: 1.0000 | ORAL_TABLET | Freq: Four times a day (QID) | ORAL | Status: DC | PRN

## 2014-05-20 MED ORDER — LACTULOSE 10 GM/15ML PO SOLN
20.0000 g | Freq: Once | ORAL | Status: DC
  Filled 2014-05-20: qty 30

## 2014-05-20 MED ORDER — ALPRAZOLAM 0.5 MG PO TABS: 0.5000 mg | ORAL_TABLET | Freq: Every evening | ORAL | Status: DC | PRN

## 2014-05-20 MED ORDER — CEPHALEXIN 500 MG PO CAPS: 500.0000 mg | ORAL_CAPSULE | Freq: Three times a day (TID) | ORAL | Status: DC

## 2014-05-20 NOTE — Progress Notes (Signed)
Patient was agitated at the beginning of the shift with multiple complaints. He wasn't happy with the way his dressing was changed, it was peeling a little and he feels that he can do it better than the nursing staff. I changed it and he verbalized that he was content with the change and also reinforced the specific MD dressing change orders. He stated that he had asked for a stool softner earlier and never received it.  Notified MD on call Dr. Tyrone Sage and received orders for 100 mg colace daily. Administered to patient at 2221.He was upset that his pain medication oxycodone was discontinued and he says the doctor never told him that he was only going to receive tylenol and ibuprofen for his pain now which he describes as throbbing.  He has tolerated receiving 650mg  tylenol po. He was given a 0.5mg  xanax po at 2222. He received his IV vancomycin. Will continue to monitor.

## 2014-05-20 NOTE — Progress Notes (Addendum)
Patient stated that he had indigestion and he thinks it came from his peanut butter. Patient given GI cocktail. Patient took a sip of it but didn't drink the entire dose because he did not like the way it tasted. Patient also asked for benedryl for itching. Will continue to monitor.

## 2014-05-20 NOTE — Progress Notes (Addendum)
      [address]       Peter Booker [id number]             [phone]             Subjective: Patient eating breakfast. Had nausea last evening. Resolved now. Patient states wants to go home today.  Objective: Vital signs in last 24 hours: Temp:  [97.8 F (36.6 C)-98.7 F (37.1 C)] 97.8 F (36.6 C) (08/04 0500) Pulse Rate:  [64-71] 68 (08/04 0500) Cardiac Rhythm:  [-] Normal sinus rhythm (08/03 2019) Resp:  [18] 18 (08/04 0500) BP: (100-121)/(60-78) 121/78 mmHg (08/04 0500) SpO2:  [95 %-100 %] 100 % (08/04 0500)   Current Weight  05/19/14 198 lb 6.6 oz (90 kg)       Intake/Output from previous day: 08/03 0701 - 08/04 0700 In: 840 [P.O.:840] Out: -    Physical Exam:  Cardiovascular: RRR Pulmonary: Clear to auscultation bilaterally; no rales, wheezes, or rhonchi. Abdomen: Soft, non tender, bowel sounds present. Extremities: Right lower extremity edema. No left lower extremity edema Wounds: Sternal wound is clean and dry.  No erythema or signs of infection. RLE wound has minor sloughy tissue peripherally. Starting to granulate. No necrotic tissue or purulence. No significant erythema.  Lab Results: CBC: Recent Labs  05/18/14 1721  WBC 7.1  HGB 9.4*  HCT 29.8*  PLT 344   BMET:  Recent Labs  05/18/14 1721  NA 139  K 4.0  CL 105  CO2 21  GLUCOSE 112*  BUN 20  CREATININE 1.08  CALCIUM 8.9    PT/INR:  Lab Results  Component Value Date   INR 1.21 05/01/2014   INR 0.98 05/01/2014   INR 1.0 04/23/2014   ABG:  INR: Will add last result for INR, ABG once components are confirmed Will add last 4 CBG results once components are confirmed  Assessment/Plan:  1. CV - SR in the 60's. On Coreg 3.125 bid. 2. S/p I and D (at bedside) of RLE wound. Continue Vancomycin and dressing changes. 3. Will discuss disposition with Dr. Laneta Simmers as will need oral antibiotics   ZIMMERMAN,DONIELLE MPA-C 05/20/2014,7:34 AM   Chart reviewed, patient  examined, agree with above. Wound examined and it looks better with minimal fibrinous material that was debrided. It is starting to granulate. I think he can go home today with wet to dry dressings by [facility]. I would keep him on Keflex for another week. He thinks that the oxycodone and dilaudid were causing his nausea, not Keflex. Will use ibuprofen for pain since he is not taking it very often.

## 2014-05-20 NOTE — Discharge Instructions (Signed)
Home health to pack RLE wound with wet to dry gauze followed by dry 4x4 and tape. Change daily.

## 2014-05-20 NOTE — Progress Notes (Signed)
Went over discharge instructions with patient. IV discontinued, patient taken off cardiac monitor. Patient given paper prescriptions, reinforced to take full prescribed dose of antibiotics until complete. Patient discharged home. Stanton Kidney R

## 2014-05-20 NOTE — Discharge Summary (Signed)
Physician Discharge Summary       [address]       [place] [id number]             [phone]    Patient ID: Peter Gullingodney D Razzano Sr. MRN: [id number] DOB/AGE: 61/12/1950 61 y.o.  Admit date: 05/18/2014 Discharge date: 05/20/2014  Admission Diagnoses: 1. Cellulitis of RLE EVH wound 2. History of CAD (s/p MI,PCI with DES stent to LAD, MI 14') 3. 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 Laneta SimmersBartle 4. History of ischemic cardiomyopathy 5. History of hypertension 6. History of tobacco abuse 7. History of GERD 8. History of arthritis  Discharge Diagnoses:  1. Cellulitis of RLE EVH wound 2. History of CAD (s/p MI,PCI with DES stent to LAD, MI 14') 3. 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 Laneta SimmersBartle 4. History of ischemic cardiomyopathy 5. History of hypertension 6. History of tobacco abuse 7. History of GERD 8. History of arthritis   Procedure (s): Bedside I and D of RLE EVH wound by Dr. Laneta SimmersBartle on 05/19/2014 and 05/20/2014.  History of Presenting Illness: This is a 63 yo man with a history of HTN, CAD and ischemic cardiomyopathy who underwent CABG x 5 on 7/16. He was discharged on 7/21. He returned to the office on 7/24 with a leg wound dehiscence. He was treated with Keflex, but stopped that medication due to nausea. He says that over the past 2 days, he has noted increased swelling and tenderness of the skin around the wound. He has also noted purulent drainage from the wound. He has had some subjective fevers. [facility] ED [facility]18/[facility]. [facility]  He is having much less sternal pain than he was even a few days ago. He has been taking Oxycodone at home even though he thinks it is making his forehead itch. Dilaudid was ineffective in relieving his pain. He was admitted for cellulitis of the RLE EVH wound. He was placed on Vancomycin.   Brief Hospital Course:  He has remained afebrile and hemodynamically stable.  Dr. Laneta SimmersBartle did an I and D of the  RLE EVH wound at the bedside on 8/3. The wound has minor sloughy tissue peripherally, no necrosis and no purulence, and no significant erythema. The wound was debrided again today. No cellulitis or purulence noted. Dr. Laneta SimmersBartle has seen and evaluated the patient, and it is felt he is surgically stable for discharge today. He will need home health to change RLE wound dressings daily. He will also need one more week of Keflex. He has had occasional nausea, which he had prior to admission. This along with itching is why he is to no longer take Oxy PRN pain. He has an allergy as well. He was instructed to take Ibuprofen PRN.  Latest Vital Signs: Blood pressure 121/78, pulse 68, temperature 97.8 F (36.6 C), temperature source Oral, resp. rate 18, height 5\' 7"  (1.702 m), weight 198 lb 6.6 oz (90 kg), SpO2 100.00%.  Physical Exam: Cardiovascular: RRR  Pulmonary: Clear to auscultation bilaterally; no rales, wheezes, or rhonchi.  Abdomen: Soft, non tender, bowel sounds present.  Extremities: Right lower extremity edema. No left lower extremity edema  Wounds: Sternal wound is clean and dry. No erythema or signs of infection. RLE wound has minor fringe, sloughy tissue. Starting to granulate. No necrotic tissue.   Discharge Condition: Stable  Recent laboratory studies:  Lab Results  Component Value Date   WBC 7.1  05/18/2014   HGB 9.4* 05/18/2014   HCT 29.8* 05/18/2014   MCV 94.6 05/18/2014   PLT 344 05/18/2014   Lab Results  Component Value Date   NA 139 05/18/2014   K 4.0 05/18/2014   CL 105 05/18/2014   CO2 21 05/18/2014   CREATININE 1.08 05/18/2014   GLUCOSE 112* 05/18/2014      Diagnostic Studies: Dg Chest Port 1 View  05/03/2014   CLINICAL DATA:  Postop cardiac surgery  EXAM: PORTABLE CHEST - 1 VIEW  COMPARISON:  Prior chest x-ray 05/02/2014  FINDINGS: Interval removal of the Swan-Ganz catheter and left-sided chest tube. The right IJ vascular sheath remains present. The tip overlies the upper SVC. No evidence  of left-sided pneumothorax. Inspiratory volumes remain very low. There is persistent bibasilar atelectasis. Slightly improved interstitial edema. Patient is status post median sternotomy. Stable cardiac and mediastinal contours. No acute osseous abnormality.  IMPRESSION: 1. Interval removal of left chest tube and Swan-Ganz catheter. 2. No evidence of left pneumothorax. 3. Slightly improved interstitial pulmonary edema. 4. Persistent low inspiratory volumes with bibasilar atelectasis.   Electronically Signed   By: Malachy Moan M.D.   On: 05/03/2014 07:55   Discharge Medications:   Medication List    STOP taking these medications       oxyCODONE-acetaminophen 5-325 MG per tablet  Commonly known as:  PERCOCET      TAKE these medications       albuterol 108 (90 BASE) MCG/ACT inhaler  Commonly known as:  PROVENTIL HFA;VENTOLIN HFA  Inhale 1-2 puffs into the lungs every 6 (six) hours as needed for wheezing or shortness of breath.     ALPRAZolam 0.5 MG tablet  Commonly known as:  XANAX  Take 1 tablet (0.5 mg total) by mouth at bedtime as needed for anxiety.     aspirin 325 MG EC tablet  Take 1 tablet (325 mg total) by mouth daily.     carvedilol 3.125 MG tablet  Commonly known as:  COREG  Take 3.125 mg by mouth 2 (two) times daily with a meal.     cephALEXin 500 MG capsule  Commonly known as:  KEFLEX  Take 1 capsule (500 mg total) by mouth 3 (three) times daily. For one week     ibuprofen 600 MG tablet  Commonly known as:  ADVIL,MOTRIN  Take 1 tablet (600 mg total) by mouth every 6 (six) hours as needed for moderate pain.     pantoprazole 40 MG tablet  Commonly known as:  PROTONIX  Take 40 mg by mouth daily.     rosuvastatin 20 MG tablet  Commonly known as:  CRESTOR  Take 20 mg by mouth daily.        Follow Up Appointments: Follow-up Information   Follow up with Alleen Borne, MD On 05/26/2014. (Appointment is with physician assistant and time is at 1:00 pm)     Specialty:  Cardiothoracic Surgery   Contact information:   [address] [place] [address] [phone]       Signed: Doree Fudge MPA-C 05/20/2014, 11:01 AM

## 2014-05-23 ENCOUNTER — Encounter: Payer: Self-pay | Admitting: *Deleted

## 2014-05-26 ENCOUNTER — Ambulatory Visit (INDEPENDENT_AMBULATORY_CARE_PROVIDER_SITE_OTHER): Payer: Self-pay | Admitting: Surgical

## 2014-05-26 VITALS — BP 123/83 | HR 67 | Resp 16 | Ht 67.0 in | Wt 205.0 lb

## 2014-05-26 DIAGNOSIS — Z5189 Encounter for other specified aftercare: Secondary | ICD-10-CM

## 2014-05-26 DIAGNOSIS — IMO0001 Reserved for inherently not codable concepts without codable children: Secondary | ICD-10-CM

## 2014-05-26 DIAGNOSIS — T8140XA Infection following a procedure, unspecified, initial encounter: Secondary | ICD-10-CM

## 2014-05-26 DIAGNOSIS — Z951 Presence of aortocoronary bypass graft: Secondary | ICD-10-CM

## 2014-05-26 DIAGNOSIS — T814XXD Infection following a procedure, subsequent encounter: Secondary | ICD-10-CM

## 2014-05-26 LAB — CULTURE, BLOOD (ROUTINE X 2)
Culture: NO GROWTH
Culture: NO GROWTH

## 2014-05-26 NOTE — Progress Notes (Signed)
[address]       [place] [id number]             [phone]                  Peter Highlandodney D Martorana Sr. [place] Medical Record #[id number] Date of Birth: 12/24/1950  Referring ON:[id number]:McAlhany, Nile Dearhristopher D* Primary Cardiology: Primary Care:JEGEDE, Keane ScrapeLUGBEMIGA, MD  Chief Complaint:  Follow Up Visit   History of Present Illness:   Patient returns today for recheck of his right lower extremity EVH wound site. He feels as though this is doing good and is doing the dressing changes himself. His only significant complaints at this time are sternal incision discomfort and difficulty sleeping.          Zubrod Score: At the time of surgery this patient's most appropriate activity status/level should be described as: []     0    Normal activity, no symptoms []     1    Restricted in physical strenuous activity but ambulatory, able to do out light work []     2    Ambulatory and capable of self care, unable to do work activities, up and about                 >50 % of waking hours                                                                                   []     3    Only limited self care, in bed greater than 50% of waking hours []     4    Completely disabled, no self care, confined to bed or chair []     5    Moribund  History  Smoking status  . Current Some Day Smoker -- 0.50 packs/day for 54 years  . Types: Cigarettes  Smokeless tobacco  . Never Used       Allergies  Allergen Reactions  . Codeine Itching  . Oxycodone Itching    Current Outpatient Prescriptions  Medication Sig Dispense Refill  . albuterol (PROVENTIL HFA;VENTOLIN HFA) 108 (90 BASE) MCG/ACT inhaler Inhale 1-2 puffs into the lungs every 6 (six) hours as needed for wheezing or shortness of breath.      . ALPRAZolam (XANAX) 0.5 MG tablet Take 1 tablet (0.5 mg total) by mouth at bedtime as needed for anxiety.    0  . aspirin EC 325 MG EC tablet Take 1 tablet (325 mg total) by mouth daily.   30 tablet  0  . carvedilol (COREG) 3.125 MG tablet Take 3.125 mg by mouth 2 (two) times daily with a meal.      . cephALEXin (KEFLEX) 500 MG capsule Take 1 capsule (500 mg total) by mouth 3 (three) times daily. For one week  21 capsule  0  . ibuprofen (ADVIL,MOTRIN) 600 MG tablet Take 1 tablet (600 mg total) by mouth every 6 (six) hours as needed for moderate pain.  30 tablet  0  . pantoprazole (PROTONIX) 40 MG tablet Take 40 mg by mouth daily.      . rosuvastatin (CRESTOR) 20 MG tablet  Take 20 mg by mouth daily.       No current facility-administered medications for this visit.       Physical Exam: BP 123/83  Pulse 67  Resp 16  Ht 5\' 7"  (1.702 m)  Wt 205 lb (92.987 kg)  BMI 32.10 kg/m2  SpO2 98%  General appearance: alert, cooperative and no distress Heart: regular rate and rhythm Lungs: clear to auscultation bilaterally Wound: The wound is granulating well. I did debride some small amounts of fibrinous tissue. I redressed it with a wet to dry saline dressing.   Diagnostic Studies & Laboratory data:         Recent Radiology Findings: No results found.    Recent Labs: Lab Results  Component Value Date   WBC 7.1 05/18/2014   HGB 9.4* 05/18/2014   HCT 29.8* 05/18/2014   PLT 344 05/18/2014   GLUCOSE 112* 05/18/2014   CHOL 170 04/29/2014   TRIG 104 04/29/2014   HDL 60 04/29/2014   LDLCALC 89 04/29/2014   ALT 38 05/18/2014   AST 26 05/18/2014   NA 139 05/18/2014   K 4.0 05/18/2014   CL 105 05/18/2014   CREATININE 1.08 05/18/2014   BUN 20 05/18/2014   CO2 21 05/18/2014   INR 1.21 05/01/2014   HGBA1C  Value: 6.1 (NOTE)                                                                       According to the ADA Clinical Practice Recommendations for 2011, when HbA1c is used as a screening test:   >=6.5%   Diagnostic of Diabetes Mellitus           (if abnormal result  is confirmed)  5.7-6.4%   Increased risk of developing Diabetes Mellitus  References:Diagnosis and Classification of Diabetes  [place],Diabetes Care,2011,34(Suppl 1):S62-S69 and Standards of Medical Care in         Diabetes - 2011,Diabetes Care,2011,34  (Suppl 1):S11-S61.* 12/31/2010      Assessment / Plan:  He is doing well. He is to continue his dressing changes and we will see him again in the office in 2 weeks. Hopefully by that he should be nearly healed. He is to continue his narcotic pain medicine which was refilled last Friday. Also told him to take ibuprofen as there is a significant inflammatory component to his pain.          GOLD,WAYNE E 05/26/2014 1:11 PM

## 2014-05-26 NOTE — Patient Instructions (Signed)
Continue current dressing changes and antibiotics.

## 2014-05-29 ENCOUNTER — Encounter: Payer: Self-pay | Admitting: Physician Assistant

## 2014-05-29 ENCOUNTER — Ambulatory Visit (INDEPENDENT_AMBULATORY_CARE_PROVIDER_SITE_OTHER): Payer: Medicaid Other | Admitting: Physician Assistant

## 2014-05-29 ENCOUNTER — Other Ambulatory Visit: Payer: Self-pay

## 2014-05-29 VITALS — BP 120/70 | HR 66 | Ht 67.0 in | Wt 201.0 lb

## 2014-05-29 DIAGNOSIS — L02419 Cutaneous abscess of limb, unspecified: Secondary | ICD-10-CM

## 2014-05-29 DIAGNOSIS — E785 Hyperlipidemia, unspecified: Secondary | ICD-10-CM

## 2014-05-29 DIAGNOSIS — I2589 Other forms of chronic ischemic heart disease: Secondary | ICD-10-CM

## 2014-05-29 DIAGNOSIS — L03115 Cellulitis of right lower limb: Secondary | ICD-10-CM

## 2014-05-29 DIAGNOSIS — I255 Ischemic cardiomyopathy: Secondary | ICD-10-CM

## 2014-05-29 DIAGNOSIS — L03119 Cellulitis of unspecified part of limb: Secondary | ICD-10-CM

## 2014-05-29 DIAGNOSIS — R609 Edema, unspecified: Secondary | ICD-10-CM

## 2014-05-29 DIAGNOSIS — I2510 Atherosclerotic heart disease of native coronary artery without angina pectoris: Secondary | ICD-10-CM

## 2014-05-29 DIAGNOSIS — I10 Essential (primary) hypertension: Secondary | ICD-10-CM

## 2014-05-29 MED ORDER — FUROSEMIDE 20 MG PO TABS: 20.0000 mg | ORAL_TABLET | Freq: Every day | ORAL | Status: DC | PRN

## 2014-05-29 MED ORDER — OXYCODONE-ACETAMINOPHEN 5-325 MG PO TABS: 1.0000 | ORAL_TABLET | Freq: Three times a day (TID) | ORAL | Status: DC | PRN

## 2014-05-29 MED ORDER — RAMIPRIL 1.25 MG PO CAPS: 1.2500 mg | ORAL_CAPSULE | Freq: Every day | ORAL | Status: DC

## 2014-05-29 NOTE — Telephone Encounter (Signed)
RX for percocet 5/325 mg written and left at front desk for pick up

## 2014-05-29 NOTE — Patient Instructions (Signed)
CALL us AND LET us KNOW OF THE MEDICATIONS YOU ARE TAKING [phone]   IF YOU ARE STILL TAKING THE RAMIPRIL THEN CONTINUE RAMIPRIL 1.25 MG DAILY; IF NOT THEN RE-START RAMIPRIL START LASIX 20 MG DAILY AS NEEDED FOR SWELLING OR WEIGHT GAIN OF 3 LB'S IN 1 DAY  LAB WORK BMET IN 1 WEEK  Your physician recommends that you schedule a follow-up appointment in: 3-4 WEEKS WITH DR. Clifton James OR SCOTT WEAVER, PAC SAME DAY DR. Clifton James IS IN THE OFFICE

## 2014-05-29 NOTE — Progress Notes (Signed)
Cardiology Office Note    Date:  05/29/2014   ID:  Peter Gullingodney D Luscher Sr., DOB 03/10/1951, MRN [id number]  PCP:  Doris CheadleADVANI, DEEPAK, MD  Cardiologist:  Dr. Verne Carrowhristopher McAlhany    History of Present Illness: Peter GullingRodney D Astle Sr. is a 63 y.o. male with a hx of CAD, ischemic CM, GERD, HTN, tobacco abuse, anxiety.  He was previously followed by Dr. Sharyn LullHarwani. Admitted 07/29/13 with an acute anterolateral MI. Emergent cath per Dr. Sharyn LullHarwani with 100% occlusion proximal LAD treated with DES x 1. The Circumflex had moderate disease. The RCA was occluded proximally and filled from collaterals. Echo 07/30/13 with LVEF=30-35%. No significant valve issues.  Myoview 12/2013 with large anterior scar, no ischemia.    He was seen in the office 04/23/14 with symptoms c/w [place].  LHC demonstrated 3 v CAD with LM involvement.  He was referred for CABG.  He was admitted 7/9-7/21. He underwent CABG with LIMA-LAD, SVG-D1, SVG-OM1/OM 2, SVG-PDA. Postoperative course was fairly uneventful. He did require transfusion with PRBCs. He remained in sinus rhythm. He was readmitted 8/2-8/4 with cellulitis of the right lower extremity EVH wound. He underwent debridement and was placed on antibiotics. He returns for follow up.    Overall, he is doing okay. At times, he feels his breathing may be worse. His weight is down a total of 9 pounds. He has occasional nausea. He is nauseated today. His chest is sore but this is improved. He occasionally has to sit up at night. He denies PND. LE edema on the right is improved. He denies syncope. He denies fevers or cough.    Studies:  - LHC (7/15):  Dist LM 70%, ostial LAD 40%, prox LAD stent ok, ostial D1 95% (Jailed by LAD stent), ostial CFX 80-90%, prox OM1 30%, mid OM2 30%, AV CFX 40%, RCA with CTO, EF 25%, apical AK.    - Echo (7/15):  EF 25-30%, anterior, septal, apical and inferoapical hypokinesis, mild LAE, PASP 58 mm Hg  - Carotid US (7/15):  Bilateral ICA 1-39%   Recent  Labs/Images: 11/20/2013: Pro B Natriuretic peptide (BNP) 1011.0*  04/29/2014: HDL Cholesterol by NMR 60; LDL (calc) 89  05/18/2014: ALT 38; Creatinine 1.08; Hemoglobin 9.4*; Potassium 4.0   No results found.   Wt Readings from Last 3 Encounters:  05/29/14 201 lb (91.173 kg)  05/26/14 205 lb (92.987 kg)  05/19/14 198 lb 6.6 oz (90 kg)     Past Medical History  Diagnosis Date  . Coronary artery disease     DES proximal LAD  . GERD (gastroesophageal reflux disease)   . MI (mitral incompetence)     anterolateral MI October 2014  . HTN (hypertension)   . Anginal pain   . Myocardial infarction   . Shortness of breath   . Arthritis     OSTEO  . Gout     Current Outpatient Prescriptions  Medication Sig Dispense Refill  . albuterol (PROVENTIL HFA;VENTOLIN HFA) 108 (90 BASE) MCG/ACT inhaler Inhale 1-2 puffs into the lungs every 6 (six) hours as needed for wheezing or shortness of breath.      . ALPRAZolam (XANAX) 0.5 MG tablet Take 1 tablet (0.5 mg total) by mouth at bedtime as needed for anxiety.    0  . aspirin EC 325 MG EC tablet Take 1 tablet (325 mg total) by mouth daily.  30 tablet  0  . carvedilol (COREG) 3.125 MG tablet Take 3.125 mg by mouth 2 (two) times daily with a  meal.      . cephALEXin (KEFLEX) 500 MG capsule Take 1 capsule (500 mg total) by mouth 3 (three) times daily. For one week  21 capsule  0  . ibuprofen (ADVIL,MOTRIN) 600 MG tablet Take 1 tablet (600 mg total) by mouth every 6 (six) hours as needed for moderate pain.  30 tablet  0  . oxyCODONE-acetaminophen (PERCOCET/ROXICET) 5-325 MG per tablet Take 1 tablet by mouth every 8 (eight) hours as needed for moderate pain or severe pain.  40 tablet  0  . pantoprazole (PROTONIX) 40 MG tablet Take 40 mg by mouth daily.      . rosuvastatin (CRESTOR) 20 MG tablet Take 20 mg by mouth daily.       No current facility-administered medications for this visit.     Allergies:   Codeine and Oxycodone   Social History:  The  patient  reports that he has been smoking Cigarettes.  He has a 27 pack-year smoking history. He has never used smokeless tobacco. He reports that he drinks about 7 ounces of alcohol per week. He reports that he does not use illicit drugs.   Family History:  The patient's family history includes CAD in his father; Cancer in his mother and sister; Heart attack (age of onset: 67) in his brother; Heart attack (age of onset: 40) in his father; Stroke in his mother.   ROS:  Please see the history of present illness.      All other systems reviewed and negative.   PHYSICAL EXAM: VS:  BP 120/70  Pulse 66  Ht 5\' 7"  (1.702 m)  Wt 201 lb (91.173 kg)  BMI 31.47 kg/m2 Well nourished, well developed, in no acute distress HEENT: normal Neck: no JVD Cardiac:  normal S1, S2; RRR; no murmur Lungs:  Decreased breath sounds bilaterally, no wheezing, rhonchi or rales Abd: soft, nontender, no hepatomegaly Ext: tight trace-1+ R LE edema, trace L LE edema  Skin: warm and dry Neuro:  CNs 2-12 intact, no focal abnormalities noted  EKG:  NSR, HR 66, RBBB, no significant change when compared prior tracing     ASSESSMENT AND PLAN:  Coronary artery disease:  He is progressing well after recent CABG. He is still somewhat slow to recover. We discussed the possibility of proceeding with cardiac rehabilitation. He declines. Continue aspirin, beta blocker, statin.  Cardiomyopathy, ischemic:  Continue beta blocker. Resume  Ramipril 1.25 mg daily.  Check a basic metabolic panel in one week. He will need another echocardiogram 90 days post bypass. If EF remains less than 35%, referred to EP.  Unspecified essential hypertension:  Controlled.  Continue current regimen.    Hyperlipidemia:  Continue statin.  Edema:  This is related to a combination of vein harvesting from the right leg as well as recent cellulitis. It generally goes down if his legs are propped up. I will provide him with a prescription for Lasix 20 mg  daily when necessary.  Cellulitis of right lower extremity:  Resolved.  FU with TCTS as planned.   Disposition:  FU Dr. Verne Carrow in 3-4 weeks.   Signed, Brynda Rim, MHS 05/29/2014 4:03 PM    [facility] [address] [place], [place], [place]  [address] Phone: [phone]; Fax: [phone]

## 2014-05-30 ENCOUNTER — Telehealth: Payer: Self-pay | Admitting: Physician Assistant

## 2014-05-30 NOTE — Telephone Encounter (Signed)
I called [facility] and advised Rx sent there in error and to disregard. Pharmacist said ok and thank you.

## 2014-05-30 NOTE — Telephone Encounter (Signed)
New message     They do not carry ramipril.  What else can the pt take?

## 2014-06-02 NOTE — Telephone Encounter (Signed)
New message     Calling to give medication list pantoprazole 40mg  daily, carvedilol 3.125 daily, crestor 20mg  daily, ramipril 1.25mg  daily, ibuprofen 600mg  daily, aspirin 81mg  daily, xanax 0.5 daily, oxycodone 5.325 2 tab every 6hrs

## 2014-06-02 NOTE — Telephone Encounter (Signed)
LMTCB

## 2014-06-03 ENCOUNTER — Other Ambulatory Visit: Payer: Self-pay | Admitting: *Deleted

## 2014-06-03 MED ORDER — CARVEDILOL 3.125 MG PO TABS: 3.1250 mg | ORAL_TABLET | Freq: Two times a day (BID) | ORAL | Status: AC

## 2014-06-04 ENCOUNTER — Ambulatory Visit: Payer: Medicaid Other | Admitting: Surgery

## 2014-06-04 ENCOUNTER — Other Ambulatory Visit: Payer: Self-pay | Admitting: *Deleted

## 2014-06-04 DIAGNOSIS — I2510 Atherosclerotic heart disease of native coronary artery without angina pectoris: Secondary | ICD-10-CM

## 2014-06-04 DIAGNOSIS — Z48812 Encounter for surgical aftercare following surgery on the circulatory system: Secondary | ICD-10-CM

## 2014-06-04 MED ORDER — ROSUVASTATIN CALCIUM 20 MG PO TABS: 20.0000 mg | ORAL_TABLET | Freq: Every day | ORAL | Status: DC

## 2014-06-04 NOTE — Telephone Encounter (Signed)
I cb pt to go over his med list that he called in 8/17 to Ernestine Mcmurray. RN. I explained to pt that I saw he told Thurston Hole that he was taking the coreg 3.125 daily and not BID also that he states he is taking ASA 81 and not 325 mg as we had noted on the chart. Pt advised that he is supposed to be taking the coreg 3.125 mg BID, and that I will make note that he is taking ASA 81 daily. Pt verified his appts while on the phone with me and verbalized understanding to our conversation about taking the  Coreg 3.125 mg BID.

## 2014-06-04 NOTE — Telephone Encounter (Signed)
I cb pt to go over his med list that he called in 8/17 to Anne L. RN. I explained to pt that I saw he told Anne that he was taking the coreg 3.125 daily and not BID also that he states he is taking ASA 81 and not 325 mg as we had noted on the chart. Pt advised that he is supposed to be taking the coreg 3.125 mg BID, and that I will make note that he is taking ASA 81 daily. Pt verified his appts while on the phone with me and verbalized understanding to our conversation about taking the  Coreg 3.125 mg BID.  

## 2014-06-05 ENCOUNTER — Other Ambulatory Visit (INDEPENDENT_AMBULATORY_CARE_PROVIDER_SITE_OTHER): Payer: Medicaid Other

## 2014-06-05 ENCOUNTER — Encounter: Payer: Self-pay | Admitting: Internal Medicine

## 2014-06-05 ENCOUNTER — Ambulatory Visit: Payer: Medicaid Other | Attending: Internal Medicine | Admitting: Internal Medicine

## 2014-06-05 VITALS — BP 107/68 | HR 76 | Temp 98.0°F | Resp 16 | Wt 201.4 lb

## 2014-06-05 DIAGNOSIS — F411 Generalized anxiety disorder: Secondary | ICD-10-CM

## 2014-06-05 DIAGNOSIS — K219 Gastro-esophageal reflux disease without esophagitis: Secondary | ICD-10-CM

## 2014-06-05 DIAGNOSIS — I2510 Atherosclerotic heart disease of native coronary artery without angina pectoris: Secondary | ICD-10-CM | POA: Insufficient documentation

## 2014-06-05 DIAGNOSIS — Z79899 Other long term (current) drug therapy: Secondary | ICD-10-CM | POA: Diagnosis not present

## 2014-06-05 DIAGNOSIS — G8929 Other chronic pain: Secondary | ICD-10-CM | POA: Diagnosis not present

## 2014-06-05 DIAGNOSIS — M109 Gout, unspecified: Secondary | ICD-10-CM | POA: Insufficient documentation

## 2014-06-05 DIAGNOSIS — I10 Essential (primary) hypertension: Secondary | ICD-10-CM

## 2014-06-05 DIAGNOSIS — I252 Old myocardial infarction: Secondary | ICD-10-CM | POA: Diagnosis not present

## 2014-06-05 DIAGNOSIS — Z951 Presence of aortocoronary bypass graft: Secondary | ICD-10-CM

## 2014-06-05 DIAGNOSIS — M542 Cervicalgia: Secondary | ICD-10-CM | POA: Diagnosis not present

## 2014-06-05 DIAGNOSIS — Z9861 Coronary angioplasty status: Secondary | ICD-10-CM | POA: Diagnosis not present

## 2014-06-05 DIAGNOSIS — F17200 Nicotine dependence, unspecified, uncomplicated: Secondary | ICD-10-CM | POA: Insufficient documentation

## 2014-06-05 DIAGNOSIS — E785 Hyperlipidemia, unspecified: Secondary | ICD-10-CM

## 2014-06-05 DIAGNOSIS — R062 Wheezing: Secondary | ICD-10-CM

## 2014-06-05 LAB — BASIC METABOLIC PANEL
BUN: 11 mg/dL (ref 6–23)
CHLORIDE: 105 meq/L (ref 96–112)
CO2: 25 mEq/L (ref 19–32)
Calcium: 8.5 mg/dL (ref 8.4–10.5)
Creatinine, Ser: 1 mg/dL (ref 0.4–1.5)
GFR: 83.48 mL/min (ref >60.00)
Glucose, Bld: 89 mg/dL (ref 70–99)
POTASSIUM: 3.6 meq/L (ref 3.5–5.1)
Sodium: 137 mEq/L (ref 135–145)

## 2014-06-05 MED ORDER — ESOMEPRAZOLE MAGNESIUM 40 MG PO CPDR: 40.0000 mg | DELAYED_RELEASE_CAPSULE | Freq: Every day | ORAL | Status: DC

## 2014-06-05 MED ORDER — ALPRAZOLAM 0.5 MG PO TABS: 0.5000 mg | ORAL_TABLET | Freq: Every evening | ORAL | Status: DC | PRN

## 2014-06-05 MED ORDER — ALBUTEROL SULFATE HFA 108 (90 BASE) MCG/ACT IN AERS: 1.0000 | INHALATION_SPRAY | Freq: Four times a day (QID) | RESPIRATORY_TRACT | Status: DC | PRN

## 2014-06-05 NOTE — Progress Notes (Signed)
MRN: [id number] Name: Peter Booker.  Sex: male Age: 63 y.o. DOB: May 07, 1951  Allergies: Codeine and Oxycodone  Chief Complaint  Patient presents with  . Follow-up    HPI: Patient is 63 y.o. male who has  History of hypertension GERD CAD, last month he underwent CABG, currently denies any active symptoms following up with her cardiologist and is on aspirin, ACE inhibitor, statins beta blocker Lasix, patient also reported to have anxiety symptoms and takes Xanax at bedtime when necessary which also also with the sleep, also patient has history of GERD as per patient the Protonix does not help which he was prescribed recently he has been on Nexium and that helps with the symptoms, patient also requesting refill on albuterol which she used when necessary for wheezing/shortness of breath. Patient also has chronic neck pain back pain and is requesting referral to see pain management.  Past Medical History  Diagnosis Date  . Coronary artery disease     DES proximal LAD  . GERD (gastroesophageal reflux disease)   . MI (mitral incompetence)     anterolateral MI October 2014  . HTN (hypertension)   . Anginal pain   . Myocardial infarction   . Shortness of breath   . Arthritis     OSTEO  . Gout     Past Surgical History  Procedure Laterality Date  . Coronary stent placement  2000, 2014  . Bilateral shoulder arthroscopy    . Coronary artery bypass graft N/A 05/01/2014    Procedure: CORONARY ARTERY BYPASS GRAFTING (CABG);  Surgeon: Alleen Borne, MD;  Location: [facility] OR;  Service: Open Heart Surgery;  Laterality: N/A;  Times 5 using left internal mammary artery and endoscopically harvested right saphenous vein  . Intraoperative transesophageal echocardiogram N/A 05/01/2014    Procedure: INTRAOPERATIVE TRANSESOPHAGEAL ECHOCARDIOGRAM;  Surgeon: Alleen Borne, MD;  Location: [facility] OR;  Service: Open Heart Surgery;  Laterality: N/A;      Medication List       This list is accurate  as of: 06/05/14 10:51 AM.  Always use your most recent med list.               albuterol 108 (90 BASE) MCG/ACT inhaler  Commonly known as:  PROVENTIL HFA;VENTOLIN HFA  Inhale 1-2 puffs into the lungs every 6 (six) hours as needed for wheezing or shortness of breath.     ALPRAZolam 0.5 MG tablet  Commonly known as:  XANAX  Take 1 tablet (0.5 mg total) by mouth at bedtime as needed for anxiety.     aspirin 325 MG EC tablet  Take 1 tablet (325 mg total) by mouth daily.     carvedilol 3.125 MG tablet  Commonly known as:  COREG  Take 1 tablet (3.125 mg total) by mouth 2 (two) times daily with a meal.     cephALEXin 500 MG capsule  Commonly known as:  KEFLEX  Take 1 capsule (500 mg total) by mouth 3 (three) times daily. For one week     esomeprazole 40 MG capsule  Commonly known as:  NEXIUM  Take 1 capsule (40 mg total) by mouth daily.     furosemide 20 MG tablet  Commonly known as:  LASIX  Take 1 tablet (20 mg total) by mouth daily as needed.     ibuprofen 600 MG tablet  Commonly known as:  ADVIL,MOTRIN  Take 1 tablet (600 mg total) by mouth every 6 (six) hours as needed for moderate  pain.     oxyCODONE-acetaminophen 5-325 MG per tablet  Commonly known as:  PERCOCET/ROXICET  Take 1 tablet by mouth every 8 (eight) hours as needed for moderate pain or severe pain.     pantoprazole 40 MG tablet  Commonly known as:  PROTONIX  Take 40 mg by mouth daily.     ramipril 1.25 MG capsule  Commonly known as:  ALTACE  Take 1 capsule (1.25 mg total) by mouth daily.     rosuvastatin 20 MG tablet  Commonly known as:  CRESTOR  Take 1 tablet (20 mg total) by mouth daily.        Meds ordered this encounter  Medications  . albuterol (PROVENTIL HFA;VENTOLIN HFA) 108 (90 BASE) MCG/ACT inhaler    Sig: Inhale 1-2 puffs into the lungs every 6 (six) hours as needed for wheezing or shortness of breath.    Dispense:  1 Inhaler    Refill:  2  . ALPRAZolam (XANAX) 0.5 MG tablet    Sig:  Take 1 tablet (0.5 mg total) by mouth at bedtime as needed for anxiety.    Dispense:  20 tablet    Refill:  0  . esomeprazole (NEXIUM) 40 MG capsule    Sig: Take 1 capsule (40 mg total) by mouth daily.    Dispense:  30 capsule    Refill:  3     There is no immunization history on file for this patient.  Family History  Problem Relation Age of Onset  . CAD Father   . Heart attack Brother 44    x 3  . Heart attack Father 1046  . Cancer Sister   . Cancer Mother   . Stroke Mother     History  Substance Use Topics  . Smoking status: Current Some Day Smoker -- 0.50 packs/day for 54 years    Types: Cigarettes  . Smokeless tobacco: Never Used  . Alcohol Use: 7.0 oz/week    14 drink(s) per week    Review of Systems   As noted in HPI  Filed Vitals:   06/05/14 1005  BP: 107/68  Pulse: 76  Temp: 98 F (36.7 C)  Resp: 16    Physical Exam  Physical Exam  Constitutional: No distress.  Eyes: EOM are normal. Pupils are equal, round, and reactive to light.  Cardiovascular: Normal rate and regular rhythm.   Pulmonary/Chest: Breath sounds normal. No respiratory distress. He has no wheezes. He has no rales.  Midline surgical scar healing well  Abdominal: Soft. There is no tenderness. There is no rebound.    CBC    Component Value Date/Time   WBC 7.1 05/18/2014 1721   RBC 3.15* 05/18/2014 1721   HGB 9.4* 05/18/2014 1721   HCT 29.8* 05/18/2014 1721   PLT 344 05/18/2014 1721   MCV 94.6 05/18/2014 1721   LYMPHSABS 1.7 05/18/2014 1721   MONOABS 0.7 05/18/2014 1721   EOSABS 0.3 05/18/2014 1721   BASOSABS 0.0 05/18/2014 1721    CMP     Component Value Date/Time   NA 139 05/18/2014 1721   K 4.0 05/18/2014 1721   CL 105 05/18/2014 1721   CO2 21 05/18/2014 1721   GLUCOSE 112* 05/18/2014 1721   BUN 20 05/18/2014 1721   CREATININE 1.08 05/18/2014 1721   CREATININE 1.00 12/13/2013 1115   CALCIUM 8.9 05/18/2014 1721   PROT 7.1 05/18/2014 1721   ALBUMIN 2.9* 05/18/2014 1721   AST 26 05/18/2014 1721   ALT 38  05/18/2014 1721  ALKPHOS 100 05/18/2014 1721   BILITOT 0.2* 05/18/2014 1721   GFRNONAA 72* 05/18/2014 1721   GFRNONAA 81 12/13/2013 1115   GFRAA 84* 05/18/2014 1721   GFRAA >89 12/13/2013 1115    Lab Results  Component Value Date/Time   CHOL 170 04/29/2014  5:00 AM    No components found with this basename: hga1c    Lab Results  Component Value Date/Time   AST 26 05/18/2014  5:21 PM    Assessment and Plan  Essential hypertension Blood pressure is well controlled continue with current meds.  Hyperlipidemia Currently patient is on Crestor, will repeat in 3 months.  S/P CABG x 5 Patient is following up with cardiology and is on aspirin statin and ACE inhibitor beta blockers.  Gastroesophageal reflux disease, esophagitis presence not specified - Plan: Advised for lifestyle modification esomeprazole (NEXIUM) 40 MG capsule  Anxiety state, unspecified - Plan: ALPRAZolam (XANAX) 0.5 MG tablet  Chronic neck pain Referred to pain management.  Wheezing - Plan: albuterol (PROVENTIL HFA;VENTOLIN HFA) 108 (90 BASE) MCG/ACT inhaler    Return in about 3 months (around 09/05/2014) for gerd.  Doris Cheadle, MD

## 2014-06-05 NOTE — Progress Notes (Signed)
Patient here for follow up Recently had open heart surgery for some blockages Would like a prescription for nexium for his acid reflux

## 2014-06-06 ENCOUNTER — Other Ambulatory Visit: Payer: Self-pay | Admitting: Surgery

## 2014-06-06 DIAGNOSIS — I2589 Other forms of chronic ischemic heart disease: Secondary | ICD-10-CM

## 2014-06-11 ENCOUNTER — Ambulatory Visit: Payer: Self-pay | Admitting: Surgery

## 2014-06-11 NOTE — Telephone Encounter (Signed)
lmptcb to let pt know that I s/w Lorin Picket W. PA since he has been bcak from vacation and that he stated pt needs to take his coreg 3.125 mg BID, ok to take ASA 81 mg daily.

## 2014-06-12 ENCOUNTER — Telehealth: Payer: Self-pay | Admitting: *Deleted

## 2014-06-30 ENCOUNTER — Ambulatory Visit: Payer: Medicaid Other | Admitting: Physician Assistant

## 2014-08-16 ENCOUNTER — Emergency Department (HOSPITAL_COMMUNITY): Payer: Medicaid Other

## 2014-08-16 ENCOUNTER — Emergency Department (HOSPITAL_COMMUNITY)
Admission: EM | Admit: 2014-08-16 | Discharge: 2014-08-16 | Disposition: A | Discharge status: Home / Self Care | Payer: Medicaid Other | Attending: Emergency Medicine | Admitting: Emergency Medicine

## 2014-08-16 ENCOUNTER — Encounter (HOSPITAL_COMMUNITY): Payer: Self-pay | Admitting: Emergency Medicine

## 2014-08-16 DIAGNOSIS — Z9861 Coronary angioplasty status: Secondary | ICD-10-CM | POA: Diagnosis not present

## 2014-08-16 DIAGNOSIS — Z9889 Other specified postprocedural states: Secondary | ICD-10-CM | POA: Insufficient documentation

## 2014-08-16 DIAGNOSIS — M1990 Unspecified osteoarthritis, unspecified site: Secondary | ICD-10-CM | POA: Insufficient documentation

## 2014-08-16 DIAGNOSIS — K219 Gastro-esophageal reflux disease without esophagitis: Secondary | ICD-10-CM | POA: Insufficient documentation

## 2014-08-16 DIAGNOSIS — Z951 Presence of aortocoronary bypass graft: Secondary | ICD-10-CM | POA: Insufficient documentation

## 2014-08-16 DIAGNOSIS — G4700 Insomnia, unspecified: Secondary | ICD-10-CM | POA: Insufficient documentation

## 2014-08-16 DIAGNOSIS — Z720 Tobacco use: Secondary | ICD-10-CM | POA: Insufficient documentation

## 2014-08-16 DIAGNOSIS — R5381 Other malaise: Secondary | ICD-10-CM

## 2014-08-16 DIAGNOSIS — Z79899 Other long term (current) drug therapy: Secondary | ICD-10-CM | POA: Diagnosis not present

## 2014-08-16 DIAGNOSIS — M791 Myalgia, unspecified site: Secondary | ICD-10-CM

## 2014-08-16 DIAGNOSIS — Z7982 Long term (current) use of aspirin: Secondary | ICD-10-CM | POA: Insufficient documentation

## 2014-08-16 DIAGNOSIS — I25119 Atherosclerotic heart disease of native coronary artery with unspecified angina pectoris: Secondary | ICD-10-CM | POA: Diagnosis not present

## 2014-08-16 DIAGNOSIS — R5383 Other fatigue: Secondary | ICD-10-CM | POA: Insufficient documentation

## 2014-08-16 DIAGNOSIS — D649 Anemia, unspecified: Secondary | ICD-10-CM | POA: Insufficient documentation

## 2014-08-16 DIAGNOSIS — I252 Old myocardial infarction: Secondary | ICD-10-CM | POA: Insufficient documentation

## 2014-08-16 DIAGNOSIS — I10 Essential (primary) hypertension: Secondary | ICD-10-CM | POA: Diagnosis not present

## 2014-08-16 LAB — CBC WITH DIFFERENTIAL/PLATELET
Basophils Absolute: 0 10*3/uL (ref 0.0–0.1)
Basophils Relative: 0 % (ref 0–1)
EOS ABS: 0.2 10*3/uL (ref 0.0–0.7)
Eosinophils Relative: 3 % (ref 0–5)
HCT: 32.2 % — ABNORMAL LOW (ref 39.0–52.0)
Hemoglobin: 9.5 g/dL — ABNORMAL LOW (ref 13.0–17.0)
Lymphocytes Relative: 18 % (ref 12–46)
Lymphs Abs: 1.1 10*3/uL (ref 0.7–4.0)
MCH: 23.5 pg — AB (ref 26.0–34.0)
MCHC: 29.5 g/dL — ABNORMAL LOW (ref 30.0–36.0)
MCV: 79.7 fL (ref 78.0–100.0)
Monocytes Absolute: 0.6 10*3/uL (ref 0.1–1.0)
Monocytes Relative: 9 % (ref 3–12)
NEUTROS PCT: 70 % (ref 43–77)
Neutro Abs: 4.3 10*3/uL (ref 1.7–7.7)
PLATELETS: 224 10*3/uL (ref 150–400)
RBC: 4.04 MIL/uL — ABNORMAL LOW (ref 4.22–5.81)
RDW: 17.6 % — AB (ref 11.5–15.5)
WBC: 6.1 10*3/uL (ref 4.0–10.5)

## 2014-08-16 LAB — COMPREHENSIVE METABOLIC PANEL
ALT: 14 U/L (ref 0–53)
AST: 16 U/L (ref 0–37)
Albumin: 3.2 g/dL — ABNORMAL LOW (ref 3.5–5.2)
Alkaline Phosphatase: 82 U/L (ref 39–117)
Anion gap: 11 (ref 5–15)
BUN: 11 mg/dL (ref 6–23)
CO2: 23 meq/L (ref 19–32)
Calcium: 8.6 mg/dL (ref 8.4–10.5)
Chloride: 105 mEq/L (ref 96–112)
Creatinine, Ser: 0.86 mg/dL (ref 0.50–1.35)
GFR calc Af Amer: >90 mL/min (ref >90)
GLUCOSE: 104 mg/dL — AB (ref 70–99)
Potassium: 3.9 mEq/L (ref 3.7–5.3)
SODIUM: 139 meq/L (ref 137–147)
Total Bilirubin: 0.5 mg/dL (ref 0.3–1.2)
Total Protein: 6.7 g/dL (ref 6.0–8.3)

## 2014-08-16 LAB — URINALYSIS, ROUTINE W REFLEX MICROSCOPIC
Bilirubin Urine: NEGATIVE
Glucose, UA: NEGATIVE mg/dL
HGB URINE DIPSTICK: NEGATIVE
Ketones, ur: NEGATIVE mg/dL
LEUKOCYTES UA: NEGATIVE
NITRITE: NEGATIVE
PROTEIN: NEGATIVE mg/dL
SPECIFIC GRAVITY, URINE: 1.014 (ref 1.005–1.030)
Urobilinogen, UA: 1 mg/dL (ref 0.0–1.0)
pH: 7 (ref 5.0–8.0)

## 2014-08-16 LAB — CK: Total CK: 79 U/L (ref 7–232)

## 2014-08-16 LAB — PRO B NATRIURETIC PEPTIDE: Pro B Natriuretic peptide (BNP): 1520 pg/mL — ABNORMAL HIGH (ref 0–125)

## 2014-08-16 LAB — I-STAT TROPONIN, ED: TROPONIN I, POC: 0.01 ng/mL (ref 0.00–0.08)

## 2014-08-16 MED ORDER — ZOLPIDEM TARTRATE 5 MG PO TABS: 5.0000 mg | ORAL_TABLET | Freq: Every evening | ORAL | Status: DC | PRN

## 2014-08-16 NOTE — ED Provider Notes (Signed)
CSN: [id number]     Arrival date & time 08/16/14  4098 History   First MD Initiated Contact with Patient 08/16/14 0700     Chief Complaint  Patient presents with  . multiple complaints      (Consider location/radiation/quality/duration/timing/severity/associated sxs/prior Treatment) HPI The patient reports generalized aching and fatigue for over 2 weeks duration. He associates this with his Crestor. There is no localization of pain. It has an aching quality. He also reports she's had a dry mouth. He reports he's had insomnia. The insomnia is gone on for more than 4 days. He reports he only slept for 1 hour last night. He has no documented fever. No skin rash. He reports his pain is exacerbated by lying on his right side. Or lying flat on his back. Apparently this is what's keeping him awake. He also reports that he gets a both burning and a sharp zinger quality pain in his right leg where his vein graft site from coronary artery bypass grafting surgery is healing. He reports it used to be about a 5 cm large wound. He reports he is here because he doesn't think his doctor listens to him in regards to his medications and he thinks he is on the wrong medications. No vomiting no diarrhea no dysuria or urgency. Past Medical History  Diagnosis Date  . Coronary artery disease     DES proximal LAD  . GERD (gastroesophageal reflux disease)   . MI (mitral incompetence)     anterolateral MI October 2014  . HTN (hypertension)   . Anginal pain   . Myocardial infarction   . Shortness of breath   . Arthritis     OSTEO  . Gout    Past Surgical History  Procedure Laterality Date  . Coronary stent placement  2000, 2014  . Bilateral shoulder arthroscopy    . Coronary artery bypass graft N/A 05/01/2014    Procedure: CORONARY ARTERY BYPASS GRAFTING (CABG);  Surgeon: Alleen Borne, MD;  Location: [facility] OR;  Service: Open Heart Surgery;  Laterality: N/A;  Times 5 using left internal mammary artery and  endoscopically harvested right saphenous vein  . Intraoperative transesophageal echocardiogram N/A 05/01/2014    Procedure: INTRAOPERATIVE TRANSESOPHAGEAL ECHOCARDIOGRAM;  Surgeon: Alleen Borne, MD;  Location: [facility] OR;  Service: Open Heart Surgery;  Laterality: N/A;   Family History  Problem Relation Age of Onset  . CAD Father   . Heart attack Brother 44    x 3  . Heart attack Father 24  . Cancer Sister   . Cancer Mother   . Stroke Mother    History  Substance Use Topics  . Smoking status: Current Some Day Smoker -- 0.50 packs/day for 54 years    Types: Cigarettes  . Smokeless tobacco: Never Used  . Alcohol Use: 7.0 oz/week    14 drink(s) per week    Review of Systems 10 Systems reviewed and are negative for acute change except as noted in the HPI.    Allergies  Codeine and Oxycodone  Home Medications   Prior to Admission medications   Medication Sig Start Date End Date Taking? Authorizing Provider  albuterol (PROVENTIL HFA;VENTOLIN HFA) 108 (90 BASE) MCG/ACT inhaler Inhale 1-2 puffs into the lungs every 6 (six) hours as needed for wheezing or shortness of breath. 06/05/14  Yes Doris Cheadle, MD  ALPRAZolam (XANAX) 0.5 MG tablet Take 1 tablet (0.5 mg total) by mouth at bedtime as needed for anxiety. 06/05/14  Yes Deepak Advani,  MD  aspirin EC 325 MG EC tablet Take 1 tablet (325 mg total) by mouth daily. 05/06/14  Yes Donielle Margaretann LovelessM Zimmerman, PA-C  carvedilol (COREG) 3.125 MG tablet Take 1 tablet (3.125 mg total) by mouth 2 (two) times daily with a meal. 06/03/14  Yes Kathleene Hazelhristopher D McAlhany, MD  oxyCODONE-acetaminophen (PERCOCET/ROXICET) 5-325 MG per tablet Take 1 tablet by mouth every 8 (eight) hours as needed for moderate pain or severe pain. 05/29/14  Yes Delight OvensEdward B Gerhardt, MD  pantoprazole (PROTONIX) 40 MG tablet Take 40 mg by mouth daily.   Yes Historical Provider, MD  PRESCRIPTION MEDICATION Take 1 tablet by mouth daily. Stomach medication. Blue capsule   Yes Historical Provider,  MD  ramipril (ALTACE) 1.25 MG capsule Take 1 capsule (1.25 mg total) by mouth daily. 05/29/14  Yes Beatrice LecherScott T Weaver, PA-C  rosuvastatin (CRESTOR) 20 MG tablet Take 1 tablet (20 mg total) by mouth daily. 06/04/14  Yes Kathleene Hazelhristopher D McAlhany, MD  zolpidem (AMBIEN) 5 MG tablet Take 1 tablet (5 mg total) by mouth at bedtime as needed for sleep. 08/16/14   Arby BarretteMarcy Delynn Pursley, MD   BP 122/78  Pulse 63  Temp(Src) 97.5 F (36.4 C) (Oral)  Resp 20  SpO2 98% Physical Exam  Nursing note and vitals reviewed. Constitutional: He is oriented to person, place, and time.  Awake, alert, nontoxic appearance. Patient has generally well appearance.  HENT:  Head: Atraumatic.  Right Ear: External ear normal.  Left Ear: External ear normal.  Nose: Nose normal.  Mouth/Throat: No oropharyngeal exudate.  Eyes: Conjunctivae and EOM are normal. Pupils are equal, round, and reactive to light. Right eye exhibits no discharge. Left eye exhibits no discharge. No scleral icterus.  Neck: Neck supple.  Cardiovascular: Normal rate, regular rhythm, normal heart sounds and intact distal pulses.   Chest surgical site is well-healed  Pulmonary/Chest: Effort normal and breath sounds normal. No respiratory distress. He has no wheezes. He has no rales.  Abdominal: Soft. Bowel sounds are normal. He exhibits no distension and no mass. There is no tenderness. There is no rebound and no guarding.  Musculoskeletal: He exhibits no edema and no tenderness.  Baseline ROM, no obvious new focal weakness. The areas of the patient identifies on his right lower extremity is a nearly completely healed vein graft site. There is less than 1 cm of pink eschar with no surrounding erythema or sign of cellulitis. There is no peripheral edema the lower extremities skin condition is very good  Neurological: He is alert and oriented to person, place, and time. No cranial nerve deficit. Coordination normal.  Mental status and motor strength appears baseline for  patient and situation. Patient is ambulatory into the emergency department he is undressing spontaneously without difficulty since on the stretcher raises his legs and repositions all with normal coordination and strength.  Skin: Skin is warm and dry. No rash noted. No erythema.  Psychiatric: He has a normal mood and affect.    ED Course  Procedures (including critical care time) Labs Review Labs Reviewed  COMPREHENSIVE METABOLIC PANEL - Abnormal; Notable for the following:    Glucose, Bld 104 (*)    Albumin 3.2 (*)    All other components within normal limits  CBC WITH DIFFERENTIAL - Abnormal; Notable for the following:    RBC 4.04 (*)    Hemoglobin 9.5 (*)    HCT 32.2 (*)    MCH 23.5 (*)    MCHC 29.5 (*)    RDW 17.6 (*)  All other components within normal limits  PRO B NATRIURETIC PEPTIDE - Abnormal; Notable for the following:    Pro B Natriuretic peptide (BNP) 1520.0 (*)    All other components within normal limits  URINALYSIS, ROUTINE W REFLEX MICROSCOPIC  CK  I-STAT TROPOININ, ED    Imaging Review Dg Chest 2 View  08/16/2014   CLINICAL DATA:  Nausea, vomiting  EXAM: CHEST  2 VIEW  COMPARISON:  Radiograph 05/03/2014  FINDINGS: Sternotomy wires overlie normal cardiac silhouette. Removal of right IJ sheath. There is consider improvement in aeration to the lung bases. No pulmonary edema. No pneumothorax. No pleural fluid.  IMPRESSION: 1. Marked improvement in aeration to the lung bases. 2. No pneumothorax or pulmonary edema.   Electronically Signed   By: Genevive Bi M.D.   On: 08/16/2014 08:30     EKG Interpretation   Date/Time:  Saturday August 16 2014 07:38:33 EDT Ventricular Rate:  64 PR Interval:  175 QRS Duration: 136 QT Interval:  469 QTC Calculation: 484 R Axis:   137 Text Interpretation:  Sinus rhythm Right bundle branch block Abnormal  lateral Q waves Anteroseptal infarct, age indeterminate agree.consistent  with prior ECG. Confirmed by Donnald Garre, MD,  Lebron Conners [phone]) on 08/16/2014  7:46:33 AM      MDM   Final diagnoses:  Myalgia  Mild chronic anemia  Malaise and fatigue  Insomnia   At this point time patient's general condition is good. He is well in appearance and has stable vital signs. Diagnostic workup does not indicate any acute findings. He is had some chronic anemia in the range of 9 mg/dL. His symptoms may be due to Crestor however due to his recent bypass surgery and coronary artery risk factors I will have him contact his cardiologist before empirically discontinuing this. While the patient's main concerns is insomnia, I will give him one weeks worth of Ambien to assist with sleep while he resumes care with his primary care provider.    Arby Barrette, MD 08/16/14 1003

## 2014-08-16 NOTE — ED Notes (Signed)
The pt has multiple complaints hurting all over sore throat dry mouth cannot sleep at night for 4 days  No temp.  He thinks the reason for his muscle aches is the crestor he has been taking

## 2014-08-16 NOTE — Discharge Instructions (Signed)
Muscle Pain  Muscle pain (myalgia) may be caused by many things, including:   Overuse or muscle strain, especially if you are not in shape. This is the most common cause of muscle pain.   Injury.   Bruises.   Viruses, such as the flu.   Infectious diseases.   Fibromyalgia, which is a chronic condition that causes muscle tenderness, fatigue, and headache.   Autoimmune diseases, including lupus.   Certain drugs, including ACE inhibitors and statins.  Muscle pain may be mild or severe. In most cases, the pain lasts only a short time and goes away without treatment. To diagnose the cause of your muscle pain, your health care provider will take your medical history. This means he or she will ask you when your muscle pain began and what has been happening. If you have not had muscle pain for very long, your health care provider may want to wait before doing much testing. If your muscle pain has lasted a long time, your health care provider may want to run tests right away. If your health care provider thinks your muscle pain may be caused by illness, you may need to have additional tests to rule out certain conditions.   Treatment for muscle pain depends on the cause. Home care is often enough to relieve muscle pain. Your health care provider may also prescribe anti-inflammatory medicine.  HOME CARE INSTRUCTIONS  Watch your condition for any changes. The following actions may help to lessen any discomfort you are feeling:   Only take over-the-counter or prescription medicines as directed by your health care provider.   Apply ice to the sore muscle:   Put ice in a plastic bag.   Place a towel between your skin and the bag.   Leave the ice on for 15-20 minutes, 3-4 times a day.   You may alternate applying hot and cold packs to the muscle as directed by your health care provider.   If overuse is causing your muscle pain, slow down your activities until the pain goes away.   Remember that it is normal to feel  some muscle pain after starting a workout program. Muscles that have not been used often will be sore at first.   Do regular, gentle exercises if you are not usually active.   Warm up before exercising to lower your risk of muscle pain.   Do not continue working out if the pain is very bad. Bad pain could mean you have injured a muscle.  SEEK MEDICAL CARE IF:   Your muscle pain gets worse, and medicines do not help.   You have muscle pain that lasts longer than 3 days.   You have a rash or fever along with muscle pain.   You have muscle pain after a tick bite.   You have muscle pain while working out, even though you are in good physical condition.   You have redness, soreness, or swelling along with muscle pain.   You have muscle pain after starting a new medicine or changing the dose of a medicine.  SEEK IMMEDIATE MEDICAL CARE IF:   You have trouble breathing.   You have trouble swallowing.   You have muscle pain along with a stiff neck, fever, and vomiting.   You have severe muscle weakness or cannot move part of your body.  MAKE SURE YOU:    Understand these instructions.   Will watch your condition.   Will get help right away if you are not   questions you have with your health care provider.  Call your heart doctor Monday to discuss discontinuing your Crestor due to your symptoms.

## 2014-08-20 ENCOUNTER — Ambulatory Visit: Payer: Medicaid Other | Admitting: Internal Medicine

## 2014-08-26 ENCOUNTER — Other Ambulatory Visit: Payer: Self-pay

## 2014-08-26 ENCOUNTER — Telehealth: Payer: Self-pay | Admitting: Internal Medicine

## 2014-08-26 DIAGNOSIS — R062 Wheezing: Secondary | ICD-10-CM

## 2014-08-26 MED ORDER — ALBUTEROL SULFATE HFA 108 (90 BASE) MCG/ACT IN AERS: 1.0000 | INHALATION_SPRAY | Freq: Four times a day (QID) | RESPIRATORY_TRACT | Status: DC | PRN

## 2014-08-26 NOTE — Telephone Encounter (Signed)
Pt. Came into facility to request a refill for albuterol (PROVENTIL HFA;VENTOLIN HFA) 108 (90 BASE) MCG/ACT inhaler and his sleeping medication. Please f/u with pt.

## 2014-09-02 ENCOUNTER — Encounter: Payer: Self-pay | Admitting: Internal Medicine

## 2014-09-02 ENCOUNTER — Ambulatory Visit: Payer: Medicaid Other | Attending: Internal Medicine | Admitting: Internal Medicine

## 2014-09-02 VITALS — BP 114/77 | HR 73 | Temp 98.0°F | Resp 16 | Wt 204.8 lb

## 2014-09-02 DIAGNOSIS — I25119 Atherosclerotic heart disease of native coronary artery with unspecified angina pectoris: Secondary | ICD-10-CM | POA: Insufficient documentation

## 2014-09-02 DIAGNOSIS — Z7982 Long term (current) use of aspirin: Secondary | ICD-10-CM | POA: Diagnosis not present

## 2014-09-02 DIAGNOSIS — Z951 Presence of aortocoronary bypass graft: Secondary | ICD-10-CM | POA: Insufficient documentation

## 2014-09-02 DIAGNOSIS — G4700 Insomnia, unspecified: Secondary | ICD-10-CM | POA: Diagnosis not present

## 2014-09-02 DIAGNOSIS — I10 Essential (primary) hypertension: Secondary | ICD-10-CM | POA: Insufficient documentation

## 2014-09-02 DIAGNOSIS — K219 Gastro-esophageal reflux disease without esophagitis: Secondary | ICD-10-CM | POA: Diagnosis not present

## 2014-09-02 DIAGNOSIS — I252 Old myocardial infarction: Secondary | ICD-10-CM | POA: Diagnosis not present

## 2014-09-02 DIAGNOSIS — Z79899 Other long term (current) drug therapy: Secondary | ICD-10-CM | POA: Insufficient documentation

## 2014-09-02 DIAGNOSIS — F17210 Nicotine dependence, cigarettes, uncomplicated: Secondary | ICD-10-CM | POA: Insufficient documentation

## 2014-09-02 DIAGNOSIS — R0602 Shortness of breath: Secondary | ICD-10-CM | POA: Insufficient documentation

## 2014-09-02 DIAGNOSIS — Z9861 Coronary angioplasty status: Secondary | ICD-10-CM | POA: Insufficient documentation

## 2014-09-02 MED ORDER — TRAZODONE HCL 50 MG PO TABS: 25.0000 mg | ORAL_TABLET | Freq: Every evening | ORAL | Status: DC | PRN

## 2014-09-02 NOTE — Patient Instructions (Signed)
Insomnia Insomnia is frequent trouble falling and/or staying asleep. Insomnia can be a long term problem or a short term problem. Both are common. Insomnia can be a short term problem when the wakefulness is related to a certain stress or worry. Long term insomnia is often related to ongoing stress during waking hours and/or poor sleeping habits. Overtime, sleep deprivation itself can make the problem worse. Every little thing feels more severe because you are overtired and your ability to cope is decreased. CAUSES   Stress, anxiety, and depression.  Poor sleeping habits.  Distractions such as TV in the bedroom.  Naps close to bedtime.  Engaging in emotionally charged conversations before bed.  Technical reading before sleep.  Alcohol and other sedatives. They may make the problem worse. They can hurt normal sleep patterns and normal dream activity.  Stimulants such as caffeine for several hours prior to bedtime.  Pain syndromes and shortness of breath can cause insomnia.  Exercise late at night.  Changing time zones may cause sleeping problems (jet lag). It is sometimes helpful to have someone observe your sleeping patterns. They should look for periods of not breathing during the night (sleep apnea). They should also look to see how long those periods last. If you live alone or observers are uncertain, you can also be observed at a sleep clinic where your sleep patterns will be professionally monitored. Sleep apnea requires a checkup and treatment. Give your caregivers your medical history. Give your caregivers observations your family has made about your sleep.  SYMPTOMS   Not feeling rested in the morning.  Anxiety and restlessness at bedtime.  Difficulty falling and staying asleep. TREATMENT   Your caregiver may prescribe treatment for an underlying medical disorders. Your caregiver can give advice or help if you are using alcohol or other drugs for self-medication. Treatment  of underlying problems will usually eliminate insomnia problems.  Medications can be prescribed for short time use. They are generally not recommended for lengthy use.  Over-the-counter sleep medicines are not recommended for lengthy use. They can be habit forming.  You can promote easier sleeping by making lifestyle changes such as:  Using relaxation techniques that help with breathing and reduce muscle tension.  Exercising earlier in the day.  Changing your diet and the time of your last meal. No night time snacks.  Establish a regular time to go to bed.  Counseling can help with stressful problems and worry.  Soothing music and white noise may be helpful if there are background noises you cannot remove.  Stop tedious detailed work at least one hour before bedtime. HOME CARE INSTRUCTIONS   Keep a diary. Inform your caregiver about your progress. This includes any medication side effects. See your caregiver regularly. Take note of:  Times when you are asleep.  Times when you are awake during the night.  The quality of your sleep.  How you feel the next day. This information will help your caregiver care for you.  Get out of bed if you are still awake after 15 minutes. Read or do some quiet activity. Keep the lights down. Wait until you feel sleepy and go back to bed.  Keep regular sleeping and waking hours. Avoid naps.  Exercise regularly.  Avoid distractions at bedtime. Distractions include watching television or engaging in any intense or detailed activity like attempting to balance the household checkbook.  Develop a bedtime ritual. Keep a familiar routine of bathing, brushing your teeth, climbing into bed at the same   time each night, listening to soothing music. Routines increase the success of falling to sleep faster.  Use relaxation techniques. This can be using breathing and muscle tension release routines. It can also include visualizing peaceful scenes. You can  also help control troubling or intruding thoughts by keeping your mind occupied with boring or repetitive thoughts like the old concept of counting sheep. You can make it more creative like imagining planting one beautiful flower after another in your backyard garden.  During your day, work to eliminate stress. When this is not possible use some of the previous suggestions to help reduce the anxiety that accompanies stressful situations. MAKE SURE YOU:   Understand these instructions.  Will watch your condition.  Will get help right away if you are not doing well or get worse. Document Released: 09/30/2000 Document Revised: 12/26/2011 Document Reviewed: 10/31/2007 Exit[employer] Patient Information 2015 [employer]. This information is not intended to replace advice given to you by your health care provider. Make sure you discuss any questions you have with your health care provider.  

## 2014-09-02 NOTE — Progress Notes (Signed)
MRN: [id number] Name: Peter GWALTNEY Sr.  Sex: male Age: 63 y.o. DOB: 10-03-1951  Allergies: Codeine and Oxycodone  Chief Complaint  Patient presents with  . Insomnia    HPI: Patient is 63 y.o. male who history of hypertension CAD, status post CABG, recently patient went to the emergency room with symptoms of myalgia as per patient when he was taking Crestor it was causing in the symptoms but along with that he is also having problem with the sleep, he was prescribed Ambien as per patient he is taking the medication but does not help him with the symptoms, patient denies any chest and shortness of breath any orthopnea or PND. As per patient he is schedule appointment with his cardiologist regarding continuation of his cholesterol medication. Patient is requesting something to help with the sleep.  Past Medical History  Diagnosis Date  . Coronary artery disease     DES proximal LAD  . GERD (gastroesophageal reflux disease)   . MI (mitral incompetence)     anterolateral MI October 2014  . HTN (hypertension)   . Anginal pain   . Myocardial infarction   . Shortness of breath   . Arthritis     OSTEO  . Gout     Past Surgical History  Procedure Laterality Date  . Coronary stent placement  2000, 2014  . Bilateral shoulder arthroscopy    . Coronary artery bypass graft N/A 05/01/2014    Procedure: CORONARY ARTERY BYPASS GRAFTING (CABG);  Surgeon: Alleen Borne, MD;  Location: [facility] OR;  Service: Open Heart Surgery;  Laterality: N/A;  Times 5 using left internal mammary artery and endoscopically harvested right saphenous vein  . Intraoperative transesophageal echocardiogram N/A 05/01/2014    Procedure: INTRAOPERATIVE TRANSESOPHAGEAL ECHOCARDIOGRAM;  Surgeon: Alleen Borne, MD;  Location: [facility] OR;  Service: Open Heart Surgery;  Laterality: N/A;      Medication List       This list is accurate as of: 09/02/14 10:30 AM.  Always use your most recent med list.               albuterol 108 (90 BASE) MCG/ACT inhaler  Commonly known as:  PROVENTIL HFA;VENTOLIN HFA  Inhale 1-2 puffs into the lungs every 6 (six) hours as needed for wheezing or shortness of breath.     ALPRAZolam 0.5 MG tablet  Commonly known as:  XANAX  Take 1 tablet (0.5 mg total) by mouth at bedtime as needed for anxiety.     aspirin 325 MG EC tablet  Take 1 tablet (325 mg total) by mouth daily.     carvedilol 3.125 MG tablet  Commonly known as:  COREG  Take 1 tablet (3.125 mg total) by mouth 2 (two) times daily with a meal.     oxyCODONE-acetaminophen 5-325 MG per tablet  Commonly known as:  PERCOCET/ROXICET  Take 1 tablet by mouth every 8 (eight) hours as needed for moderate pain or severe pain.     pantoprazole 40 MG tablet  Commonly known as:  PROTONIX  Take 40 mg by mouth daily.     PRESCRIPTION MEDICATION  Take 1 tablet by mouth daily. Stomach medication. Blue capsule     ramipril 1.25 MG capsule  Commonly known as:  ALTACE  Take 1 capsule (1.25 mg total) by mouth daily.     rosuvastatin 20 MG tablet  Commonly known as:  CRESTOR  Take 1 tablet (20 mg total) by mouth daily.     traZODone  50 MG tablet  Commonly known as:  DESYREL  Take 0.5-1 tablets (25-50 mg total) by mouth at bedtime as needed for sleep.     zolpidem 5 MG tablet  Commonly known as:  AMBIEN  Take 1 tablet (5 mg total) by mouth at bedtime as needed for sleep.        Meds ordered this encounter  Medications  . traZODone (DESYREL) 50 MG tablet    Sig: Take 0.5-1 tablets (25-50 mg total) by mouth at bedtime as needed for sleep.    Dispense:  30 tablet    Refill:  3     There is no immunization history on file for this patient.  Family History  Problem Relation Age of Onset  . CAD Father   . Heart attack Brother 44    x 3  . Heart attack Father [id number]  . Cancer Sister   . Cancer Mother   . Stroke Mother     History  Substance Use Topics  . Smoking status: Current Some Day Smoker -- 0.50  packs/day for 54 years    Types: Cigarettes  . Smokeless tobacco: Never Used  . Alcohol Use: 7.0 oz/week    14 drink(s) per week    Review of Systems   As noted in HPI  Filed Vitals:   09/02/14 1000  BP: 114/77  Pulse: 73  Temp: 98 F (36.7 C)  Resp: 16    Physical Exam  Physical Exam  Constitutional: No distress.  Eyes: EOM are normal. Pupils are equal, round, and reactive to light.  Cardiovascular: Normal rate and regular rhythm.   Pulmonary/Chest: Breath sounds normal. No respiratory distress. He has no wheezes. He has no rales.  Musculoskeletal: He exhibits no edema.    CBC    Component Value Date/Time   WBC 6.1 08/16/2014 0726   RBC 4.04* 08/16/2014 0726   HGB 9.5* 08/16/2014 0726   HCT 32.2* 08/16/2014 0726   PLT 224 08/16/2014 0726   MCV 79.7 08/16/2014 0726   LYMPHSABS 1.1 08/16/2014 0726   MONOABS 0.6 08/16/2014 0726   EOSABS 0.2 08/16/2014 0726   BASOSABS 0.0 08/16/2014 0726    CMP     Component Value Date/Time   NA 139 08/16/2014 0726   K 3.9 08/16/2014 0726   CL 105 08/16/2014 0726   CO2 23 08/16/2014 0726   GLUCOSE 104* 08/16/2014 0726   BUN 11 08/16/2014 0726   CREATININE 0.86 08/16/2014 0726   CREATININE 1.00 12/13/2013 1115   CALCIUM 8.6 08/16/2014 0726   PROT 6.7 08/16/2014 0726   ALBUMIN 3.2* 08/16/2014 0726   AST 16 08/16/2014 0726   ALT 14 08/16/2014 0726   ALKPHOS 82 08/16/2014 0726   BILITOT 0.5 08/16/2014 0726   GFRNONAA >90 08/16/2014 0726   GFRNONAA 81 12/13/2013 1115   GFRAA >90 08/16/2014 0726   GFRAA >89 12/13/2013 1115    Lab Results  Component Value Date/Time   CHOL 170 04/29/2014 05:00 AM    No components found for: HGA1C  Lab Results  Component Value Date/Time   AST 16 08/16/2014 07:26 AM    Assessment and Plan  Insomnia - Plan: advised patient for sleep hygiene, he also try  over-the-counter melatonin , prescribed  traZODone (DESYREL) 50 MG tablet  Essential hypertension Blood pressure is well  controlled continued current medications, patient is scheduled to follow with his cardiologist.  Health Maintenance Patient will come back for flu shot    Return in about 3 months (  around 12/03/2014).  Doris Cheadle, MD

## 2014-09-02 NOTE — Progress Notes (Signed)
Patient here for follow up Complains of having insomnia Was seen in the ED for the symptoms and states the medications he received is not working Patient states he only slept two hours last night

## 2014-09-10 ENCOUNTER — Emergency Department (HOSPITAL_COMMUNITY): Payer: Medicaid Other

## 2014-09-10 ENCOUNTER — Encounter (HOSPITAL_COMMUNITY): Payer: Self-pay | Admitting: *Deleted

## 2014-09-10 ENCOUNTER — Emergency Department (HOSPITAL_COMMUNITY)
Admission: EM | Admit: 2014-09-10 | Discharge: 2014-09-10 | Disposition: A | Discharge status: Home / Self Care | Payer: Medicaid Other | Attending: Emergency Medicine | Admitting: Emergency Medicine

## 2014-09-10 DIAGNOSIS — K219 Gastro-esophageal reflux disease without esophagitis: Secondary | ICD-10-CM | POA: Insufficient documentation

## 2014-09-10 DIAGNOSIS — I252 Old myocardial infarction: Secondary | ICD-10-CM | POA: Diagnosis not present

## 2014-09-10 DIAGNOSIS — J209 Acute bronchitis, unspecified: Secondary | ICD-10-CM | POA: Insufficient documentation

## 2014-09-10 DIAGNOSIS — R0602 Shortness of breath: Secondary | ICD-10-CM | POA: Diagnosis present

## 2014-09-10 DIAGNOSIS — M1990 Unspecified osteoarthritis, unspecified site: Secondary | ICD-10-CM | POA: Insufficient documentation

## 2014-09-10 DIAGNOSIS — I10 Essential (primary) hypertension: Secondary | ICD-10-CM | POA: Insufficient documentation

## 2014-09-10 DIAGNOSIS — J40 Bronchitis, not specified as acute or chronic: Secondary | ICD-10-CM

## 2014-09-10 DIAGNOSIS — Z95818 Presence of other cardiac implants and grafts: Secondary | ICD-10-CM | POA: Insufficient documentation

## 2014-09-10 DIAGNOSIS — Z720 Tobacco use: Secondary | ICD-10-CM | POA: Insufficient documentation

## 2014-09-10 DIAGNOSIS — I2510 Atherosclerotic heart disease of native coronary artery without angina pectoris: Secondary | ICD-10-CM | POA: Diagnosis not present

## 2014-09-10 DIAGNOSIS — Z79899 Other long term (current) drug therapy: Secondary | ICD-10-CM | POA: Diagnosis not present

## 2014-09-10 LAB — BASIC METABOLIC PANEL
ANION GAP: 14 (ref 5–15)
BUN: 14 mg/dL (ref 6–23)
CHLORIDE: 102 meq/L (ref 96–112)
CO2: 20 mEq/L (ref 19–32)
Calcium: 8.8 mg/dL (ref 8.4–10.5)
Creatinine, Ser: 0.83 mg/dL (ref 0.50–1.35)
GFR calc non Af Amer: >90 mL/min (ref >90)
Glucose, Bld: 104 mg/dL — ABNORMAL HIGH (ref 70–99)
POTASSIUM: 4 meq/L (ref 3.7–5.3)
SODIUM: 136 meq/L — AB (ref 137–147)

## 2014-09-10 LAB — PRO B NATRIURETIC PEPTIDE: Pro B Natriuretic peptide (BNP): 1277 pg/mL — ABNORMAL HIGH (ref 0–125)

## 2014-09-10 LAB — CBC
HCT: 33.8 % — ABNORMAL LOW (ref 39.0–52.0)
Hemoglobin: 9.9 g/dL — ABNORMAL LOW (ref 13.0–17.0)
MCH: 22 pg — ABNORMAL LOW (ref 26.0–34.0)
MCHC: 29.3 g/dL — AB (ref 30.0–36.0)
MCV: 75.3 fL — ABNORMAL LOW (ref 78.0–100.0)
PLATELETS: 234 10*3/uL (ref 150–400)
RBC: 4.49 MIL/uL (ref 4.22–5.81)
RDW: 18.3 % — ABNORMAL HIGH (ref 11.5–15.5)
WBC: 9.8 10*3/uL (ref 4.0–10.5)

## 2014-09-10 LAB — I-STAT TROPONIN, ED: TROPONIN I, POC: 0 ng/mL (ref 0.00–0.08)

## 2014-09-10 MED ORDER — ALBUTEROL SULFATE HFA 108 (90 BASE) MCG/ACT IN AERS
1.0000 | INHALATION_SPRAY | Freq: Four times a day (QID) | RESPIRATORY_TRACT | Status: DC
  Administered 2014-09-10: 1 via RESPIRATORY_TRACT
  Filled 2014-09-10: qty 6.7

## 2014-09-10 MED ORDER — IPRATROPIUM-ALBUTEROL 0.5-2.5 (3) MG/3ML IN SOLN
3.0000 mL | Freq: Once | RESPIRATORY_TRACT | Status: AC
  Administered 2014-09-10: 3 mL via RESPIRATORY_TRACT
  Filled 2014-09-10: qty 3

## 2014-09-10 MED ORDER — PREDNISONE 20 MG PO TABS: 40.0000 mg | ORAL_TABLET | Freq: Every day | ORAL | Status: DC

## 2014-09-10 MED ORDER — IPRATROPIUM-ALBUTEROL 0.5-2.5 (3) MG/3ML IN SOLN
5.0000 mL | Freq: Once | RESPIRATORY_TRACT | Status: AC
  Administered 2014-09-10: 5 mL via RESPIRATORY_TRACT
  Filled 2014-09-10: qty 6

## 2014-09-10 MED ORDER — PREDNISONE 20 MG PO TABS
60.0000 mg | ORAL_TABLET | Freq: Once | ORAL | Status: AC
  Administered 2014-09-10: 60 mg via ORAL
  Filled 2014-09-10: qty 3

## 2014-09-10 MED ORDER — AEROCHAMBER PLUS FLO-VU MEDIUM MISC
1.0000 | Freq: Once | Status: AC
  Administered 2014-09-10: 1
  Filled 2014-09-10 (×2): qty 1

## 2014-09-10 NOTE — ED Provider Notes (Signed)
CSN: [id number]     Arrival date & time 09/10/14  1421 History   First MD Initiated Contact with Patient 09/10/14 1513     Chief Complaint  Patient presents with  . Shortness of Breath     (Consider location/radiation/quality/duration/timing/severity/associated sxs/prior Treatment) HPI Comments: The patient is a 63 year old male with past medical history of CAD status post stent placement and CABG, hypertension, presents emergency room chief complaint of productive cough and postnasal drip for 3 days. The patient reports helping a friend install a back door approximately one day prior to symptom onset, patient reports friend had runny nose and cough. Patient reports subjective fever 4 days ago, self resolved. He denies chest pain, shortness breath, chest tightness. Patient reports moderate resolution of symptoms after receiving albuterol nebulizer treatment in ED.  The history is provided by the patient. No language interpreter was used.    Past Medical History  Diagnosis Date  . Coronary artery disease     DES proximal LAD  . GERD (gastroesophageal reflux disease)   . MI (mitral incompetence)     anterolateral MI October 2014  . HTN (hypertension)   . Anginal pain   . Myocardial infarction   . Shortness of breath   . Arthritis     OSTEO  . Gout    Past Surgical History  Procedure Laterality Date  . Coronary stent placement  2000, 2014  . Bilateral shoulder arthroscopy    . Coronary artery bypass graft N/A 05/01/2014    Procedure: CORONARY ARTERY BYPASS GRAFTING (CABG);  Surgeon: Alleen Borne, MD;  Location: [facility] OR;  Service: Open Heart Surgery;  Laterality: N/A;  Times 5 using left internal mammary artery and endoscopically harvested right saphenous vein  . Intraoperative transesophageal echocardiogram N/A 05/01/2014    Procedure: INTRAOPERATIVE TRANSESOPHAGEAL ECHOCARDIOGRAM;  Surgeon: Alleen Borne, MD;  Location: [facility] OR;  Service: Open Heart Surgery;  Laterality: N/A;    Family History  Problem Relation Age of Onset  . CAD Father   . Heart attack Brother 44    x 3  . Heart attack Father 75  . Cancer Sister   . Cancer Mother   . Stroke Mother    History  Substance Use Topics  . Smoking status: Current Some Day Smoker -- 0.50 packs/day for 54 years    Types: Cigarettes  . Smokeless tobacco: Never Used  . Alcohol Use: 7.0 oz/week    14 drink(s) per week    Review of Systems  Constitutional: Positive for fever. Negative for chills.  HENT: Positive for postnasal drip and rhinorrhea.   Respiratory: Positive for cough. Negative for chest tightness and shortness of breath.   Cardiovascular: Negative for chest pain, palpitations and leg swelling.      Allergies  Codeine and Oxycodone  Home Medications   Prior to Admission medications   Medication Sig Start Date End Date Taking? Authorizing Provider  albuterol (PROVENTIL HFA;VENTOLIN HFA) 108 (90 BASE) MCG/ACT inhaler Inhale 1-2 puffs into the lungs every 6 (six) hours as needed for wheezing or shortness of breath. 08/26/14   Doris Cheadle, MD  ALPRAZolam Prudy Feeler) 0.5 MG tablet Take 1 tablet (0.5 mg total) by mouth at bedtime as needed for anxiety. 06/05/14   Doris Cheadle, MD  aspirin EC 325 MG EC tablet Take 1 tablet (325 mg total) by mouth daily. 05/06/14   Donielle Margaretann Loveless, PA-C  carvedilol (COREG) 3.125 MG tablet Take 1 tablet (3.125 mg total) by mouth 2 (two) times daily  with a meal. 06/03/14   Kathleene Hazelhristopher D McAlhany, MD  oxyCODONE-acetaminophen (PERCOCET/ROXICET) 5-325 MG per tablet Take 1 tablet by mouth every 8 (eight) hours as needed for moderate pain or severe pain. 05/29/14   Delight OvensEdward B Gerhardt, MD  pantoprazole (PROTONIX) 40 MG tablet Take 40 mg by mouth daily.    Historical Provider, MD  PRESCRIPTION MEDICATION Take 1 tablet by mouth daily. Stomach medication. Blue capsule    Historical Provider, MD  ramipril (ALTACE) 1.25 MG capsule Take 1 capsule (1.25 mg total) by mouth daily.  05/29/14   Beatrice LecherScott T Weaver, PA-C  rosuvastatin (CRESTOR) 20 MG tablet Take 1 tablet (20 mg total) by mouth daily. 06/04/14   Kathleene Hazelhristopher D McAlhany, MD  traZODone (DESYREL) 50 MG tablet Take 0.5-1 tablets (25-50 mg total) by mouth at bedtime as needed for sleep. 09/02/14   Doris Cheadleeepak Advani, MD  zolpidem (AMBIEN) 5 MG tablet Take 1 tablet (5 mg total) by mouth at bedtime as needed for sleep. 08/16/14   Arby BarretteMarcy Pfeiffer, MD   BP 126/61 mmHg  Pulse 70  Temp(Src) 97.7 F (36.5 C) (Oral)  Resp 18  SpO2 98% Physical Exam  Constitutional: He is oriented to person, place, and time. He appears well-developed and well-nourished. No distress.  HENT:  Head: Normocephalic and atraumatic.  Neck: Neck supple.  Cardiovascular: Normal rate and regular rhythm.   No lower extremity edema.  Pulmonary/Chest: Effort normal. No respiratory distress. He has wheezes. He has no rales.  More pronounced in bilateral lower lung field. Patient is able to speak in complete sentences.   Abdominal: Soft. There is no tenderness. There is no rebound.  Neurological: He is alert and oriented to person, place, and time.  Skin: Skin is warm and dry. He is not diaphoretic.  Psychiatric: He has a normal mood and affect. His behavior is normal.  Nursing note and vitals reviewed.   ED Course  Procedures (including critical care time) Labs Review Labs Reviewed  CBC - Abnormal; Notable for the following:    Hemoglobin 9.9 (*)    HCT 33.8 (*)    MCV 75.3 (*)    MCH 22.0 (*)    MCHC 29.3 (*)    RDW 18.3 (*)    All other components within normal limits  BASIC METABOLIC PANEL - Abnormal; Notable for the following:    Sodium 136 (*)    Glucose, Bld 104 (*)    All other components within normal limits  PRO B NATRIURETIC PEPTIDE - Abnormal; Notable for the following:    Pro B Natriuretic peptide (BNP) 1277.0 (*)    All other components within normal limits  I-STAT TROPOININ, ED    Imaging Review Dg Chest 2 View  09/10/2014    CLINICAL DATA:  Shortness of breath, cough, dizziness fever for 2 days  EXAM: CHEST  2 VIEW  COMPARISON:  09/10/2014  FINDINGS: Cardiomediastinal silhouette is stable. Status post median sternotomy. No acute infiltrate or pulmonary edema. Central mild bronchitic changes. Again noted postsurgical changes bilateral shoulders.  IMPRESSION: No acute infiltrate or pulmonary edema. Status post median sternotomy. Central mild bronchitic changes.   Electronically Signed   By: Natasha MeadLiviu  Pop M.D.   On: 09/10/2014 16:45     EKG Interpretation   Date/Time:  Wednesday September 10 2014 14:35:33 EST Ventricular Rate:  74 PR Interval:  172 QRS Duration: 130 QT Interval:  446 QTC Calculation: 495 R Axis:   -144 Text Interpretation:  Normal sinus rhythm Right bundle branch block  Anterolateral infarct , age undetermined Abnormal ECG No significant  change since last tracing Confirmed by ALLEN  MD, ANTHONY ([id number]) on  09/10/2014 4:36:18 PM      MDM   Final diagnoses:  Bronchitis   patient presents with productive cough no dyspnea or chest discomfort at this time. Patient reports relief of symptoms with breathing treatment. Moderate in historian For wheezing or prominent in bases bilaterally plan to give DuoNeb him a prednisone reevaluate.  1700 reevaluation patient resting comfortable in room reports moderate resolution of symptoms with second breathing treatment, repeat lung auscultation without wheezing. A chest x-ray negative,  Troponin negative. Doubt ACS or PE at this time. Pt denies chest discomfort or SOB. Discussed lab results, imaging results, and treatment plan with the patient. Return precautions given. Reports understanding and no other concerns at this time.  Patient is stable for discharge at this time. Meds given in ED:  Medications  albuterol (PROVENTIL HFA;VENTOLIN HFA) 108 (90 BASE) MCG/ACT inhaler 1 puff (not administered)  AEROCHAMBER PLUS FLO-VU MEDIUM device MISC 1 each (not  administered)  ipratropium-albuterol (DUONEB) 0.5-2.5 (3) MG/3ML nebulizer solution 3 mL (3 mLs Nebulization Given 09/10/14 1443)  ipratropium-albuterol (DUONEB) 0.5-2.5 (3) MG/3ML nebulizer solution 5 mL (5 mLs Nebulization Given 09/10/14 1534)  predniSONE (DELTASONE) tablet 60 mg (60 mg Oral Given 09/10/14 1533)    New Prescriptions   PREDNISONE (DELTASONE) 20 MG TABLET    Take 2 tablets (40 mg total) by mouth daily. Take 40 mg by mouth daily for 3 days, then 20mg  by mouth daily for 3 days, then 10mg  daily for 3 days        Mellody Drown, PA-C 09/12/14 1409  Toy Baker, MD 09/15/14 1027

## 2014-09-10 NOTE — ED Notes (Signed)
Pt reports recent cough and cold symptoms that started on Sunday, wheezing noted at triage and pt reports sob. Denies swelling to extremities. spo2 97% and ekg done at triage.

## 2014-09-10 NOTE — ED Notes (Signed)
Patient transported to X-ray 

## 2014-09-10 NOTE — Discharge Instructions (Signed)
Call for a follow up appointment with a Family or Primary Care Provider.  °Return if Symptoms worsen.   °Take medication as prescribed.  ° °

## 2014-09-10 NOTE — ED Notes (Signed)
Pt just used inhaler, waiting on spacer, no Distress at this time.

## 2014-09-14 ENCOUNTER — Emergency Department (HOSPITAL_COMMUNITY)
Admission: EM | Admit: 2014-09-14 | Discharge: 2014-09-14 | Disposition: A | Discharge status: Home / Self Care | Payer: Medicaid Other | Attending: Emergency Medicine | Admitting: Emergency Medicine

## 2014-09-14 ENCOUNTER — Encounter (HOSPITAL_COMMUNITY): Payer: Self-pay | Admitting: Emergency Medicine

## 2014-09-14 ENCOUNTER — Emergency Department (HOSPITAL_COMMUNITY): Payer: Medicaid Other

## 2014-09-14 DIAGNOSIS — I2510 Atherosclerotic heart disease of native coronary artery without angina pectoris: Secondary | ICD-10-CM | POA: Insufficient documentation

## 2014-09-14 DIAGNOSIS — Z955 Presence of coronary angioplasty implant and graft: Secondary | ICD-10-CM | POA: Insufficient documentation

## 2014-09-14 DIAGNOSIS — I10 Essential (primary) hypertension: Secondary | ICD-10-CM | POA: Insufficient documentation

## 2014-09-14 DIAGNOSIS — Z79899 Other long term (current) drug therapy: Secondary | ICD-10-CM | POA: Insufficient documentation

## 2014-09-14 DIAGNOSIS — K219 Gastro-esophageal reflux disease without esophagitis: Secondary | ICD-10-CM | POA: Diagnosis not present

## 2014-09-14 DIAGNOSIS — Z951 Presence of aortocoronary bypass graft: Secondary | ICD-10-CM | POA: Diagnosis not present

## 2014-09-14 DIAGNOSIS — M109 Gout, unspecified: Secondary | ICD-10-CM | POA: Insufficient documentation

## 2014-09-14 DIAGNOSIS — Z87891 Personal history of nicotine dependence: Secondary | ICD-10-CM | POA: Diagnosis not present

## 2014-09-14 DIAGNOSIS — I252 Old myocardial infarction: Secondary | ICD-10-CM | POA: Insufficient documentation

## 2014-09-14 DIAGNOSIS — R0602 Shortness of breath: Secondary | ICD-10-CM | POA: Diagnosis present

## 2014-09-14 DIAGNOSIS — M1990 Unspecified osteoarthritis, unspecified site: Secondary | ICD-10-CM | POA: Diagnosis not present

## 2014-09-14 DIAGNOSIS — J209 Acute bronchitis, unspecified: Secondary | ICD-10-CM | POA: Diagnosis not present

## 2014-09-14 DIAGNOSIS — J40 Bronchitis, not specified as acute or chronic: Secondary | ICD-10-CM

## 2014-09-14 DIAGNOSIS — R61 Generalized hyperhidrosis: Secondary | ICD-10-CM | POA: Diagnosis not present

## 2014-09-14 DIAGNOSIS — Z7982 Long term (current) use of aspirin: Secondary | ICD-10-CM | POA: Diagnosis not present

## 2014-09-14 LAB — CBC WITH DIFFERENTIAL/PLATELET
BASOS ABS: 0 10*3/uL (ref 0.0–0.1)
Basophils Relative: 0 % (ref 0–1)
EOS ABS: 0.3 10*3/uL (ref 0.0–0.7)
Eosinophils Relative: 3 % (ref 0–5)
HCT: 35.5 % — ABNORMAL LOW (ref 39.0–52.0)
Hemoglobin: 10.5 g/dL — ABNORMAL LOW (ref 13.0–17.0)
LYMPHS PCT: 21 % (ref 12–46)
Lymphs Abs: 2.2 10*3/uL (ref 0.7–4.0)
MCH: 22.2 pg — ABNORMAL LOW (ref 26.0–34.0)
MCHC: 29.6 g/dL — AB (ref 30.0–36.0)
MCV: 75.1 fL — ABNORMAL LOW (ref 78.0–100.0)
Monocytes Absolute: 0.7 10*3/uL (ref 0.1–1.0)
Monocytes Relative: 7 % (ref 3–12)
NEUTROS ABS: 7.3 10*3/uL (ref 1.7–7.7)
NEUTROS PCT: 69 % (ref 43–77)
Platelets: ADEQUATE 10*3/uL (ref 150–400)
RBC: 4.73 MIL/uL (ref 4.22–5.81)
RDW: 18.2 % — ABNORMAL HIGH (ref 11.5–15.5)
WBC: 10.5 10*3/uL (ref 4.0–10.5)

## 2014-09-14 LAB — I-STAT CHEM 8, ED
BUN: 23 mg/dL (ref 6–23)
CREATININE: 0.9 mg/dL (ref 0.50–1.35)
Calcium, Ion: 1.11 mmol/L — ABNORMAL LOW (ref 1.13–1.30)
Chloride: 102 mEq/L (ref 96–112)
GLUCOSE: 105 mg/dL — AB (ref 70–99)
HEMATOCRIT: 37 % — AB (ref 39.0–52.0)
HEMOGLOBIN: 12.6 g/dL — AB (ref 13.0–17.0)
Potassium: 3.8 mEq/L (ref 3.7–5.3)
SODIUM: 139 meq/L (ref 137–147)
TCO2: 25 mmol/L (ref 0–100)

## 2014-09-14 LAB — I-STAT TROPONIN, ED: Troponin i, poc: 0 ng/mL (ref 0.00–0.08)

## 2014-09-14 LAB — PRO B NATRIURETIC PEPTIDE: Pro B Natriuretic peptide (BNP): 2654 pg/mL — ABNORMAL HIGH (ref 0–125)

## 2014-09-14 MED ORDER — ALBUTEROL (5 MG/ML) CONTINUOUS INHALATION SOLN
10.0000 mg/h | INHALATION_SOLUTION | RESPIRATORY_TRACT | Status: AC
  Administered 2014-09-14: 10 mg/h via RESPIRATORY_TRACT
  Filled 2014-09-14: qty 20

## 2014-09-14 MED ORDER — ACETAMINOPHEN 325 MG PO TABS
650.0000 mg | ORAL_TABLET | Freq: Four times a day (QID) | ORAL | Status: DC | PRN
  Administered 2014-09-14: 650 mg via ORAL
  Filled 2014-09-14: qty 2

## 2014-09-14 MED ORDER — LEVOFLOXACIN 750 MG PO TABS: 750.0000 mg | ORAL_TABLET | Freq: Every day | ORAL | Status: DC

## 2014-09-14 MED ORDER — ALBUTEROL SULFATE (2.5 MG/3ML) 0.083% IN NEBU: 2.5000 mg | INHALATION_SOLUTION | Freq: Four times a day (QID) | RESPIRATORY_TRACT | Status: DC | PRN

## 2014-09-14 NOTE — ED Provider Notes (Addendum)
CSN: [id number]     Arrival date & time 09/14/14  0217 History  This chart was scribed for Peter Roller, Peter Booker by Roxy Cedar, ED Scribe. This patient was seen in room A03C/A03C and the patient's care was started at 2:22 AM.   Chief Complaint  Patient presents with  . Shortness of Breath   Patient is a 63 y.o. male presenting with shortness of breath. The history is provided by the patient. No language interpreter was used.  Shortness of Breath Associated symptoms: cough, diaphoresis and wheezing    HPI Comments: Peter WINCHELL Sr. is a 63 y.o. male with a history of asthma, CAD, GERD, MI, and hypertension who presents to the Emergency Department complaining of moderate SOB that began a few weeks ago and worsened in the past few days. He reports associated cough and diaphoresis. Patient is a former smoker and stopped 1 year ago. He denies associated edema in extremities, abdominal pain.  Pt was seen at the hospital 4 days ago and diagnosed with bronchitis. During that visit, he had a negative xray, no signs of cardiac abnormalities and approved upon discharge. He was prescribed prednisone. His symptoms improved until today.  Past Medical History  Diagnosis Date  . Coronary artery disease     DES proximal LAD  . GERD (gastroesophageal reflux disease)   . MI (mitral incompetence)     anterolateral MI October 2014  . HTN (hypertension)   . Anginal pain   . Myocardial infarction   . Shortness of breath   . Arthritis     OSTEO  . Gout    Past Surgical History  Procedure Laterality Date  . Coronary stent placement  2000, 2014  . Bilateral shoulder arthroscopy    . Coronary artery bypass graft N/A 05/01/2014    Procedure: CORONARY ARTERY BYPASS GRAFTING (CABG);  Surgeon: Alleen Borne, Peter Booker;  Location: [facility] OR;  Service: Open Heart Surgery;  Laterality: N/A;  Times 5 using left internal mammary artery and endoscopically harvested right saphenous vein  . Intraoperative  transesophageal echocardiogram N/A 05/01/2014    Procedure: INTRAOPERATIVE TRANSESOPHAGEAL ECHOCARDIOGRAM;  Surgeon: Alleen Borne, Peter Booker;  Location: [facility] OR;  Service: Open Heart Surgery;  Laterality: N/A;   Family History  Problem Relation Age of Onset  . CAD Father   . Heart attack Brother 44    x 3  . Heart attack Father 28  . Cancer Sister   . Cancer Mother   . Stroke Mother    History  Substance Use Topics  . Smoking status: Former Smoker -- 0.50 packs/day for 54 years    Types: Cigarettes  . Smokeless tobacco: Current User    Types: Chew  . Alcohol Use: 7.0 oz/week    14 Not specified per week   Review of Systems  Constitutional: Positive for diaphoresis.  Respiratory: Positive for cough, shortness of breath and wheezing.   All other systems reviewed and are negative.  Allergies  Codeine and Oxycodone  Home Medications   Prior to Admission medications   Medication Sig Start Date End Date Taking? Authorizing Provider  albuterol (PROVENTIL HFA;VENTOLIN HFA) 108 (90 BASE) MCG/ACT inhaler Inhale 1-2 puffs into the lungs every 6 (six) hours as needed for wheezing or shortness of breath. 08/26/14  Yes Doris Cheadle, Peter Booker  ALPRAZolam (XANAX) 0.5 MG tablet Take 1 tablet (0.5 mg total) by mouth at bedtime as needed for anxiety. 06/05/14  Yes Doris Cheadle, Peter Booker  aspirin EC 325 MG EC tablet  Take 1 tablet (325 mg total) by mouth daily. 05/06/14  Yes Donielle Margaretann Loveless, PA-C  carvedilol (COREG) 3.125 MG tablet Take 1 tablet (3.125 mg total) by mouth 2 (two) times daily with a meal. 06/03/14  Yes Kathleene Hazel, Peter Booker  esomeprazole (NEXIUM) 40 MG capsule Take 40 mg by mouth daily at 12 noon.   Yes Historical Provider, Peter Booker  oxyCODONE-acetaminophen (PERCOCET/ROXICET) 5-325 MG per tablet Take 1 tablet by mouth every 8 (eight) hours as needed for moderate pain or severe pain. 05/29/14  Yes Delight Ovens, Peter Booker  pantoprazole (PROTONIX) 40 MG tablet Take 40 mg by mouth daily.   Yes Historical  Provider, Peter Booker  predniSONE (DELTASONE) 20 MG tablet Take 2 tablets (40 mg total) by mouth daily. Take 40 mg by mouth daily for 3 days, then 20mg  by mouth daily for 3 days, then 10mg  daily for 3 days 09/10/14  Yes Mellody Drown, PA-C  PRESCRIPTION MEDICATION Take 1 tablet by mouth daily. Stomach medication. Blue capsule   Yes Historical Provider, Peter Booker  ramipril (ALTACE) 1.25 MG capsule Take 1 capsule (1.25 mg total) by mouth daily. 05/29/14  Yes Beatrice Lecher, PA-C  rosuvastatin (CRESTOR) 20 MG tablet Take 1 tablet (20 mg total) by mouth daily. 06/04/14  Yes Kathleene Hazel, Peter Booker  spironolactone (ALDACTONE) 25 MG tablet Take 12.5 mg by mouth daily.   Yes Historical Provider, Peter Booker  albuterol (PROVENTIL) (2.5 MG/3ML) 0.083% nebulizer solution Take 3 mLs (2.5 mg total) by nebulization every 6 (six) hours as needed for wheezing or shortness of breath. 09/14/14   Peter Roller, Peter Booker  levofloxacin (LEVAQUIN) 750 MG tablet Take 1 tablet (750 mg total) by mouth daily. 09/14/14   Peter Roller, Peter Booker  traZODone (DESYREL) 50 MG tablet Take 0.5-1 tablets (25-50 mg total) by mouth at bedtime as needed for sleep. Patient not taking: Reported on 09/14/2014 09/02/14   Doris Cheadle, Peter Booker  zolpidem (AMBIEN) 5 MG tablet Take 1 tablet (5 mg total) by mouth at bedtime as needed for sleep. Patient not taking: Reported on 09/10/2014 08/16/14   Arby Barrette, Peter Booker   Triage Vitals: Resp 20  Ht 5\' 7"  (1.702 m)  Wt 205 lb (92.987 kg)  BMI 32.10 kg/m2  SpO2 99%  Physical Exam  Constitutional: He appears well-developed and well-nourished. He appears distressed.  HENT:  Head: Normocephalic and atraumatic.  Mouth/Throat: Oropharynx is clear and moist. No oropharyngeal exudate.  Eyes: Conjunctivae and EOM are normal. Pupils are equal, round, and reactive to light. Right eye exhibits no discharge. Left eye exhibits no discharge. No scleral icterus.  Neck: Normal range of motion. Neck supple. No JVD present. No thyromegaly present.   Cardiovascular: Normal rate, regular rhythm, normal heart sounds and intact distal pulses.  Exam reveals no gallop and no friction rub.   No murmur heard. Pulmonary/Chest: He is in respiratory distress. He has wheezes. He has no rales.  increase WOB. Prolonged expiratory phase. Speaks in short sentences. Positive excessive muscle use.  Abdominal: Soft. Bowel sounds are normal. He exhibits no distension and no mass. There is no tenderness.  Musculoskeletal: Normal range of motion. He exhibits no edema or tenderness.  Lymphadenopathy:    He has no cervical adenopathy.  Neurological: He is alert. Coordination normal.  Skin: Skin is warm. No rash noted. He is diaphoretic. No erythema.  Psychiatric: He has a normal mood and affect. His behavior is normal.  Nursing note and vitals reviewed.  ED Course  Procedures (including critical care  time)  DIAGNOSTIC STUDIES: Oxygen Saturation is 99% on RA, normal by my interpretation.    COORDINATION OF CARE: 2:27 AM- Discussed plans to order diagnostic CXR and lab work. Will give pt albuterol breathing treatment. Pt advised of plan for treatment and pt agrees.  Labs Review Labs Reviewed  CBC WITH DIFFERENTIAL - Abnormal; Notable for the following:    Hemoglobin 10.5 (*)    HCT 35.5 (*)    MCV 75.1 (*)    MCH 22.2 (*)    MCHC 29.6 (*)    RDW 18.2 (*)    All other components within normal limits  PRO B NATRIURETIC PEPTIDE - Abnormal; Notable for the following:    Pro B Natriuretic peptide (BNP) 2654.0 (*)    All other components within normal limits  I-STAT CHEM 8, ED - Abnormal; Notable for the following:    Glucose, Bld 105 (*)    Calcium, Ion 1.11 (*)    Hemoglobin 12.6 (*)    HCT 37.0 (*)    All other components within normal limits  I-STAT TROPOININ, ED    Imaging Review Dg Chest 2 View  09/14/2014   CLINICAL DATA:  Shortness of breath for 3 days, recent diagnosis of bronchitis.  EXAM: CHEST  2 VIEW  COMPARISON:  Chest radiograph  September 10, 2014  FINDINGS: Cardiac silhouette is upper limits of normal in size, unchanged. Mediastinal silhouette is nonsuspicious, status post median sternotomy. Slightly increasing bronchitic changes without pleural effusions or focal consolidations. No pneumothorax. Bilateral screws within the scapula. Soft tissue planes and included osseous structures are nonsuspicious.  IMPRESSION: Slightly increasing bronchitic changes without focal consolidation.  Stable borderline cardiomegaly.   Electronically Signed   By: Awilda Metroourtnay  Bloomer   On: 09/14/2014 03:03     EKG Interpretation   Date/Time:  Sunday September 14 2014 02:33:31 EST Ventricular Rate:  83 PR Interval:  175 QRS Duration: 139 QT Interval:  404 QTC Calculation: 475 R Axis:   -133 Text Interpretation:  Sinus rhythm Right bundle branch block Anterolateral  infarct, age indeterminate since last tracing no significant change  Confirmed by Sara Selvidge  Peter Booker, Mahalia Dykes ([id number]) on 09/14/2014 6:17:21 AM     MDM   Final diagnoses:  SOB (shortness of breath)  Bronchitis    After continuous nebulizer therapy and steroids the patient was able to ambulate around the emergency department with significant improvement in his work of breathing, air movement and his ability to speak in full sentences. At this time the patient states that he wants to go home, he does not want to be admitted to the hospital, his x-ray and labs support that he is likely has bronchitis. Medications given as below, prescriptions given for home, patient expresses understanding. He will be given a prescription for a nebulizer with refill medications.  I suspect that this is pulmonary wheezing and not cardiac wheezing given the fact that the patient has no pulmonary edema or peripheral edema on his exam and has significant significant improvement in his exam after albuterol nebulizer therapy. Again the patient told that this could be related to his heart but declines admission and  would rather go home to follow-up as an outpatient.  Meds given in ED:  Medications  albuterol (PROVENTIL,VENTOLIN) solution continuous neb (0 mg/hr Nebulization Stopped 09/14/14 0425)  acetaminophen (TYLENOL) tablet 650 mg (650 mg Oral Given 09/14/14 0525)    New Prescriptions   ALBUTEROL (PROVENTIL) (2.5 MG/3ML) 0.083% NEBULIZER SOLUTION    Take 3 mLs (2.5 mg  total) by nebulization every 6 (six) hours as needed for wheezing or shortness of breath.   LEVOFLOXACIN (LEVAQUIN) 750 MG TABLET    Take 1 tablet (750 mg total) by mouth daily.      I personally performed the services described in this documentation, which was scribed in my presence. The recorded information has been reviewed and is accurate.  Peter Roller, Peter Booker 09/14/14 1610  Peter Roller, Peter Booker 09/14/14 [phone]

## 2014-09-14 NOTE — ED Notes (Signed)
Hour long neb completed.  RA sats dropped to 91% oxygen [place] 2 liters applied

## 2014-09-14 NOTE — ED Notes (Signed)
Discharge instructions and prescriptions reviewed, voiced understanding. 

## 2014-09-14 NOTE — ED Notes (Signed)
PT maintained 98% oxygen saturation while ambulating

## 2014-09-14 NOTE — ED Notes (Addendum)
Pt arrives via EMS with shortness of breath, cough with discolored production and streaks of blood. 2 nebs PTA with 10mg  albuterol and .5 MG atrovent, 125 solumedrol. Initial O2 91%. Pt speaking in full sentences at triage without pause,

## 2014-09-14 NOTE — Discharge Instructions (Signed)
#  1 please finish your prednisone #2 albuterol treatments every 4 hours for the next 24 hours, then every 4 hours as needed #3 Levaquin antibiotic once a day for 7 days Return to the hospital for severe or worsening wheezing coughing fevers chills chest pain or shortness of breath.

## 2014-09-18 ENCOUNTER — Ambulatory Visit: Payer: Medicaid Other | Admitting: Internal Medicine

## 2014-09-22 ENCOUNTER — Ambulatory Visit: Payer: Medicaid Other

## 2014-09-25 ENCOUNTER — Encounter (HOSPITAL_COMMUNITY): Payer: Self-pay | Admitting: Cardiology

## 2014-11-27 ENCOUNTER — Emergency Department (HOSPITAL_COMMUNITY)
Admission: EM | Admit: 2014-11-27 | Discharge: 2014-11-27 | Discharge status: Against Medical Advice | Payer: Medicaid Other

## 2014-11-27 NOTE — ED Notes (Signed)
Patient called for triage x 2.  No answer.  Waiting room and bathrooms checked for patient.

## 2014-11-27 NOTE — ED Notes (Signed)
Pt called with no answer

## 2014-12-23 ENCOUNTER — Telehealth: Payer: Self-pay | Admitting: *Deleted

## 2014-12-23 NOTE — Telephone Encounter (Signed)
Received prior auth request for Crestor. Pt first seen in office on 11/29/13 and was on Crestor at that time.  Pt was previously followed by Dr. Sharyn Lull.  I placed call to pt to see if he has been on other cholesterol medications in the past and why they may have been stopped.  Left message to call back.  Pt also missed last office visit and is due to be seen in office.

## 2014-12-25 NOTE — Telephone Encounter (Signed)
I spoke with pt's pharmacy and pt has previously been on Atorvastatin. This was prescribed by pt's previous cardiologist--Dr. Sharyn Lull.

## 2014-12-25 NOTE — Telephone Encounter (Signed)
Left message to call back  

## 2014-12-26 NOTE — Telephone Encounter (Signed)
Chart reviewed with pharmacist in office and pt was unable to achieve LDL less than 100 on max dose Atorvastatin. I completed prior auth and faxed in.

## 2014-12-29 ENCOUNTER — Emergency Department (HOSPITAL_COMMUNITY): Payer: Medicaid Other

## 2014-12-29 ENCOUNTER — Encounter (HOSPITAL_COMMUNITY): Payer: Self-pay | Admitting: *Deleted

## 2014-12-29 ENCOUNTER — Emergency Department (HOSPITAL_COMMUNITY)
Admission: EM | Admit: 2014-12-29 | Discharge: 2014-12-29 | Disposition: A | Discharge status: Home / Self Care | Payer: Medicaid Other | Attending: Emergency Medicine | Admitting: Emergency Medicine

## 2014-12-29 DIAGNOSIS — Z87891 Personal history of nicotine dependence: Secondary | ICD-10-CM | POA: Insufficient documentation

## 2014-12-29 DIAGNOSIS — Z79899 Other long term (current) drug therapy: Secondary | ICD-10-CM | POA: Diagnosis not present

## 2014-12-29 DIAGNOSIS — Z7982 Long term (current) use of aspirin: Secondary | ICD-10-CM | POA: Diagnosis not present

## 2014-12-29 DIAGNOSIS — K219 Gastro-esophageal reflux disease without esophagitis: Secondary | ICD-10-CM | POA: Insufficient documentation

## 2014-12-29 DIAGNOSIS — R0602 Shortness of breath: Secondary | ICD-10-CM | POA: Diagnosis present

## 2014-12-29 DIAGNOSIS — J45901 Unspecified asthma with (acute) exacerbation: Secondary | ICD-10-CM | POA: Insufficient documentation

## 2014-12-29 DIAGNOSIS — E785 Hyperlipidemia, unspecified: Secondary | ICD-10-CM | POA: Insufficient documentation

## 2014-12-29 DIAGNOSIS — I25119 Atherosclerotic heart disease of native coronary artery with unspecified angina pectoris: Secondary | ICD-10-CM | POA: Diagnosis not present

## 2014-12-29 DIAGNOSIS — M1990 Unspecified osteoarthritis, unspecified site: Secondary | ICD-10-CM | POA: Insufficient documentation

## 2014-12-29 DIAGNOSIS — Z7952 Long term (current) use of systemic steroids: Secondary | ICD-10-CM | POA: Diagnosis not present

## 2014-12-29 DIAGNOSIS — I252 Old myocardial infarction: Secondary | ICD-10-CM | POA: Diagnosis not present

## 2014-12-29 DIAGNOSIS — I10 Essential (primary) hypertension: Secondary | ICD-10-CM | POA: Diagnosis not present

## 2014-12-29 DIAGNOSIS — J40 Bronchitis, not specified as acute or chronic: Secondary | ICD-10-CM

## 2014-12-29 HISTORY — DX: Unspecified asthma, uncomplicated: J45.909

## 2014-12-29 HISTORY — DX: Hyperlipidemia, unspecified: E78.5

## 2014-12-29 LAB — CBC WITH DIFFERENTIAL/PLATELET
BASOS ABS: 0 10*3/uL (ref 0.0–0.1)
Basophils Relative: 0 % (ref 0–1)
Eosinophils Absolute: 0.2 10*3/uL (ref 0.0–0.7)
Eosinophils Relative: 3 % (ref 0–5)
HEMATOCRIT: 33.8 % — AB (ref 39.0–52.0)
Hemoglobin: 10.1 g/dL — ABNORMAL LOW (ref 13.0–17.0)
LYMPHS ABS: 1.4 10*3/uL (ref 0.7–4.0)
LYMPHS PCT: 18 % (ref 12–46)
MCH: 23.3 pg — AB (ref 26.0–34.0)
MCHC: 29.9 g/dL — AB (ref 30.0–36.0)
MCV: 78.1 fL (ref 78.0–100.0)
Monocytes Absolute: 0.6 10*3/uL (ref 0.1–1.0)
Monocytes Relative: 8 % (ref 3–12)
NEUTROS ABS: 5.3 10*3/uL (ref 1.7–7.7)
Neutrophils Relative %: 71 % (ref 43–77)
PLATELETS: 212 10*3/uL (ref 150–400)
RBC: 4.33 MIL/uL (ref 4.22–5.81)
RDW: 19.1 % — AB (ref 11.5–15.5)
WBC: 7.5 10*3/uL (ref 4.0–10.5)

## 2014-12-29 LAB — BASIC METABOLIC PANEL
Anion gap: 10 (ref 5–15)
BUN: 7 mg/dL (ref 6–23)
CO2: 22 mmol/L (ref 19–32)
CREATININE: 0.95 mg/dL (ref 0.50–1.35)
Calcium: 8.8 mg/dL (ref 8.4–10.5)
Chloride: 107 mmol/L (ref 96–112)
GFR calc Af Amer: >90 mL/min (ref >90)
GFR calc non Af Amer: 87 mL/min — ABNORMAL LOW (ref >90)
GLUCOSE: 121 mg/dL — AB (ref 70–99)
Potassium: 4.1 mmol/L (ref 3.5–5.1)
SODIUM: 139 mmol/L (ref 135–145)

## 2014-12-29 LAB — TROPONIN I

## 2014-12-29 LAB — BRAIN NATRIURETIC PEPTIDE: B Natriuretic Peptide: 411.8 pg/mL — ABNORMAL HIGH (ref 0.0–100.0)

## 2014-12-29 MED ORDER — PREDNISONE 20 MG PO TABS: 40.0000 mg | ORAL_TABLET | Freq: Every day | ORAL | Status: DC

## 2014-12-29 MED ORDER — ALBUTEROL SULFATE (2.5 MG/3ML) 0.083% IN NEBU: 2.5000 mg | INHALATION_SOLUTION | Freq: Four times a day (QID) | RESPIRATORY_TRACT | Status: DC | PRN

## 2014-12-29 MED ORDER — METHYLPREDNISOLONE SODIUM SUCC 125 MG IJ SOLR
125.0000 mg | Freq: Once | INTRAMUSCULAR | Status: AC
  Administered 2014-12-29: 125 mg via INTRAVENOUS
  Filled 2014-12-29: qty 2

## 2014-12-29 MED ORDER — IPRATROPIUM BROMIDE 0.02 % IN SOLN
0.5000 mg | Freq: Once | RESPIRATORY_TRACT | Status: AC
  Administered 2014-12-29: 0.5 mg via RESPIRATORY_TRACT
  Filled 2014-12-29: qty 2.5

## 2014-12-29 MED ORDER — AZITHROMYCIN 250 MG PO TABS: 250.0000 mg | ORAL_TABLET | Freq: Every day | ORAL | Status: DC

## 2014-12-29 MED ORDER — ALBUTEROL (5 MG/ML) CONTINUOUS INHALATION SOLN
10.0000 mg/h | INHALATION_SOLUTION | Freq: Once | RESPIRATORY_TRACT | Status: AC
  Administered 2014-12-29: 10 mg/h via RESPIRATORY_TRACT
  Filled 2014-12-29: qty 20

## 2014-12-29 MED ORDER — SODIUM CHLORIDE 0.9 % IV SOLN
INTRAVENOUS | Status: DC
  Administered 2014-12-29: 20 mL/h via INTRAVENOUS

## 2014-12-29 MED ORDER — IBUPROFEN 400 MG PO TABS
600.0000 mg | ORAL_TABLET | Freq: Once | ORAL | Status: AC
  Administered 2014-12-29: 600 mg via ORAL
  Filled 2014-12-29 (×2): qty 1

## 2014-12-29 NOTE — ED Notes (Signed)
Patient has hx of pulmonary disease.  He has been using home meds until today.  He ran out of meds.  Patient reports today his cough has increased, he has sob, and chest pain.  He reports he is coughing up green sputum.  Patient is alert.  States he has had some sweating spells as well.  Patient is seen by Dr Sharyn Lull.  Patient is seen by [facility].  Patient has not taken any meds for chest pain.  Patient feels that his sx are related to lungs

## 2014-12-29 NOTE — ED Provider Notes (Signed)
CSN: [id number]     Arrival date & time 12/29/14  0506 History   First MD Initiated Contact with Patient 12/29/14 [phone]     Chief Complaint  Patient presents with  . Shortness of Breath  . Cough  . Chest Pain     (Consider location/radiation/quality/duration/timing/severity/associated sxs/prior Treatment) HPI Comments: Pt with h/o bronchitis and this is similar--denies any chf or angina sx, ran out of his inhalers  Patient is a 64 y.o. male presenting with shortness of breath, cough, and chest pain. The history is provided by the patient.  Shortness of Breath Severity:  Moderate Onset quality:  Sudden Duration:  2 days Timing:  Constant Progression:  Worsening Chronicity:  Recurrent Relieved by:  Nothing Worsened by:  Nothing tried Ineffective treatments:  None tried Associated symptoms: chest pain and cough   Cough Associated symptoms: chest pain and shortness of breath   Chest Pain Associated symptoms: cough and shortness of breath     Past Medical History  Diagnosis Date  . Coronary artery disease     DES proximal LAD  . GERD (gastroesophageal reflux disease)   . MI (mitral incompetence)     anterolateral MI October 2014  . HTN (hypertension)   . Anginal pain   . Myocardial infarction   . Shortness of breath   . Arthritis     OSTEO  . Gout   . Hyperlipemia   . Asthma    Past Surgical History  Procedure Laterality Date  . Coronary stent placement  2000, 2014  . Bilateral shoulder arthroscopy    . Coronary artery bypass graft N/A 05/01/2014    Procedure: CORONARY ARTERY BYPASS GRAFTING (CABG);  Surgeon: Alleen Borne, MD;  Location: [facility] OR;  Service: Open Heart Surgery;  Laterality: N/A;  Times 5 using left internal mammary artery and endoscopically harvested right saphenous vein  . Intraoperative transesophageal echocardiogram N/A 05/01/2014    Procedure: INTRAOPERATIVE TRANSESOPHAGEAL ECHOCARDIOGRAM;  Surgeon: Alleen Borne, MD;  Location: [facility] OR;  Service:  Open Heart Surgery;  Laterality: N/A;  . Left heart catheterization with coronary angiogram N/A 07/29/2013    Procedure: LEFT HEART CATHETERIZATION WITH CORONARY ANGIOGRAM;  Surgeon: Robynn Pane, MD;  Location: [facility] CATH LAB;  Service: Cardiovascular;  Laterality: N/A;  . Percutaneous stent intervention  07/29/2013    Procedure: PERCUTANEOUS STENT INTERVENTION;  Surgeon: Robynn Pane, MD;  Location: [facility] CATH LAB;  Service: Cardiovascular;;  . Left heart catheterization with coronary angiogram N/A 04/24/2014    Procedure: LEFT HEART CATHETERIZATION WITH CORONARY ANGIOGRAM;  Surgeon: Kathleene Hazel, MD;  Location: [facility] CATH LAB;  Service: Cardiovascular;  Laterality: N/A;   Family History  Problem Relation Age of Onset  . CAD Father   . Heart attack Brother 44    x 3  . Heart attack Father 32  . Cancer Sister   . Cancer Mother   . Stroke Mother    History  Substance Use Topics  . Smoking status: Former Smoker -- 0.50 packs/day for 54 years    Types: Cigarettes  . Smokeless tobacco: Current User    Types: Chew  . Alcohol Use: No     Comment: former    Review of Systems  Respiratory: Positive for cough and shortness of breath.   Cardiovascular: Positive for chest pain.  All other systems reviewed and are negative.     Allergies  Codeine and Oxycodone  Home Medications   Prior to Admission medications   Medication Sig Start  Date End Date Taking? Authorizing Provider  albuterol (PROVENTIL HFA;VENTOLIN HFA) 108 (90 BASE) MCG/ACT inhaler Inhale 1-2 puffs into the lungs every 6 (six) hours as needed for wheezing or shortness of breath. 08/26/14   Doris Cheadle, MD  albuterol (PROVENTIL) (2.5 MG/3ML) 0.083% nebulizer solution Take 3 mLs (2.5 mg total) by nebulization every 6 (six) hours as needed for wheezing or shortness of breath. 09/14/14   Eber Hong, MD  ALPRAZolam Prudy Feeler) 0.5 MG tablet Take 1 tablet (0.5 mg total) by mouth at bedtime as needed for anxiety. 06/05/14    Doris Cheadle, MD  aspirin EC 325 MG EC tablet Take 1 tablet (325 mg total) by mouth daily. 05/06/14   Donielle Margaretann Loveless, PA-C  carvedilol (COREG) 3.125 MG tablet Take 1 tablet (3.125 mg total) by mouth 2 (two) times daily with a meal. 06/03/14   Kathleene Hazel, MD  esomeprazole (NEXIUM) 40 MG capsule Take 40 mg by mouth daily at 12 noon.    Historical Provider, MD  levofloxacin (LEVAQUIN) 750 MG tablet Take 1 tablet (750 mg total) by mouth daily. 09/14/14   Eber Hong, MD  oxyCODONE-acetaminophen (PERCOCET/ROXICET) 5-325 MG per tablet Take 1 tablet by mouth every 8 (eight) hours as needed for moderate pain or severe pain. 05/29/14   Delight Ovens, MD  pantoprazole (PROTONIX) 40 MG tablet Take 40 mg by mouth daily.    Historical Provider, MD  predniSONE (DELTASONE) 20 MG tablet Take 2 tablets (40 mg total) by mouth daily. Take 40 mg by mouth daily for 3 days, then 20mg  by mouth daily for 3 days, then 10mg  daily for 3 days 09/10/14   Mellody Drown, PA-C  PRESCRIPTION MEDICATION Take 1 tablet by mouth daily. Stomach medication. Blue capsule    Historical Provider, MD  ramipril (ALTACE) 1.25 MG capsule Take 1 capsule (1.25 mg total) by mouth daily. 05/29/14   Beatrice Lecher, PA-C  rosuvastatin (CRESTOR) 20 MG tablet Take 1 tablet (20 mg total) by mouth daily. 06/04/14   Kathleene Hazel, MD  spironolactone (ALDACTONE) 25 MG tablet Take 12.5 mg by mouth daily.    Historical Provider, MD  traZODone (DESYREL) 50 MG tablet Take 0.5-1 tablets (25-50 mg total) by mouth at bedtime as needed for sleep. Patient not taking: Reported on 09/14/2014 09/02/14   Doris Cheadle, MD  zolpidem (AMBIEN) 5 MG tablet Take 1 tablet (5 mg total) by mouth at bedtime as needed for sleep. Patient not taking: Reported on 09/10/2014 08/16/14   Arby Barrette, MD   BP 121/67 mmHg  Pulse 80  Temp(Src) 98.5 F (36.9 C) (Oral)  Resp 21  SpO2 97% Physical Exam  Constitutional: He is oriented to person, place,  and time. He appears well-developed and well-nourished.  Non-toxic appearance. No distress.  HENT:  Head: Normocephalic and atraumatic.  Eyes: Conjunctivae, EOM and lids are normal. Pupils are equal, round, and reactive to light.  Neck: Normal range of motion. Neck supple. No tracheal deviation present. No thyroid mass present.  Cardiovascular: Normal rate, regular rhythm and normal heart sounds.  Exam reveals no gallop.   No murmur heard. Pulmonary/Chest: Effort normal. No stridor. No respiratory distress. He has no decreased breath sounds. He has wheezes. He has no rhonchi. He has no rales.  Abdominal: Soft. Normal appearance and bowel sounds are normal. He exhibits no distension. There is no tenderness. There is no rebound and no CVA tenderness.  Musculoskeletal: Normal range of motion. He exhibits no edema or tenderness.  Neurological:  He is alert and oriented to person, place, and time. He has normal strength. No cranial nerve deficit or sensory deficit. GCS eye subscore is 4. GCS verbal subscore is 5. GCS motor subscore is 6.  Skin: Skin is warm and dry. No abrasion and no rash noted.  Psychiatric: He has a normal mood and affect. His speech is normal and behavior is normal.  Nursing note and vitals reviewed.   ED Course  Procedures (including critical care time) Labs Review Labs Reviewed  CBC WITH DIFFERENTIAL/PLATELET  BASIC METABOLIC PANEL  TROPONIN I  BRAIN NATRIURETIC PEPTIDE    Imaging Review No results found.   EKG Interpretation   Date/Time:  Monday December 29 2014 05:13:14 EDT Ventricular Rate:  79 PR Interval:  175 QRS Duration: 131 QT Interval:  402 QTC Calculation: 461 R Axis:   140 Text Interpretation:  Sinus rhythm Right bundle branch block Extensive  anterior infarct, old Baseline wander in lead(s) V3 No significant change  since last tracing Confirmed by Ashrith Sagan  MD, Schylar Wuebker ([id number]) on 12/29/2014  5:20:29 AM      MDM   Final diagnoses:  SOB (shortness  of breath)    Patient given albuterol along with solu- Medrol and feels better. Wheezing has improved. Will be placed on prednisone and also given antibiotics.    Lorre Nick, MD 12/29/14 [phone]

## 2014-12-29 NOTE — ED Notes (Signed)
Patient pulse ox noted to fluctuate 90-95% on air neb.

## 2014-12-29 NOTE — ED Notes (Signed)
Patient states he thinks he had an allergic reaction to streroid pills in the past.  Review of chart shows that he received solumedrol on last visit with no issues

## 2014-12-29 NOTE — Discharge Instructions (Signed)

## 2015-01-06 ENCOUNTER — Ambulatory Visit: Payer: Medicaid Other | Attending: Internal Medicine | Admitting: Internal Medicine

## 2015-01-06 ENCOUNTER — Encounter: Payer: Self-pay | Admitting: Internal Medicine

## 2015-01-06 VITALS — BP 110/73 | HR 71 | Temp 98.0°F | Resp 16 | Wt 212.8 lb

## 2015-01-06 DIAGNOSIS — I2510 Atherosclerotic heart disease of native coronary artery without angina pectoris: Secondary | ICD-10-CM | POA: Diagnosis not present

## 2015-01-06 DIAGNOSIS — K219 Gastro-esophageal reflux disease without esophagitis: Secondary | ICD-10-CM | POA: Diagnosis not present

## 2015-01-06 DIAGNOSIS — R0981 Nasal congestion: Secondary | ICD-10-CM | POA: Diagnosis not present

## 2015-01-06 DIAGNOSIS — Z951 Presence of aortocoronary bypass graft: Secondary | ICD-10-CM

## 2015-01-06 DIAGNOSIS — I252 Old myocardial infarction: Secondary | ICD-10-CM | POA: Insufficient documentation

## 2015-01-06 DIAGNOSIS — Z8709 Personal history of other diseases of the respiratory system: Secondary | ICD-10-CM | POA: Diagnosis not present

## 2015-01-06 DIAGNOSIS — E785 Hyperlipidemia, unspecified: Secondary | ICD-10-CM | POA: Diagnosis not present

## 2015-01-06 DIAGNOSIS — J45909 Unspecified asthma, uncomplicated: Secondary | ICD-10-CM | POA: Diagnosis not present

## 2015-01-06 DIAGNOSIS — I10 Essential (primary) hypertension: Secondary | ICD-10-CM | POA: Diagnosis not present

## 2015-01-06 MED ORDER — FLUTICASONE PROPIONATE 50 MCG/ACT NA SUSP: 2.0000 | Freq: Every day | NASAL | Status: DC

## 2015-01-06 NOTE — Progress Notes (Signed)
Patient is here for follow up from the ED  Patient is concerned he is still having some SOB and worried about his lungs Patient states hospital only gave him a weeks worth of medications and he is still Not feeling better

## 2015-01-06 NOTE — Progress Notes (Signed)
MRN: [id number] Name: Peter HILBUN Sr.  Sex: male Age: 64 y.o. DOB: [birth year]  Allergies: Codeine and Oxycodone  Chief Complaint  Patient presents with  . Follow-up    HPI: Patient is 64 y.o. male who history of hypertension, CAD, status post CABG in the past, recently went to the emergency room with the symptoms of coughing and shortness of breath, EMR reviewed her patient was treated for bronchitis already finished a course of antibiotic and was given prednisone, currently is still having some nasal congestion postnasal drip and occasional shortness of breath he is using the inhaler albuterol, as per patient is following up with his cardiologist Dr. Sharyn Lull , currently denies any chest pain, currently denies smoking cigarettes.  Past Medical History  Diagnosis Date  . Coronary artery disease     DES proximal LAD  . GERD (gastroesophageal reflux disease)   . MI (mitral incompetence)     anterolateral MI October 2014  . HTN (hypertension)   . Anginal pain   . Myocardial infarction   . Shortness of breath   . Arthritis     OSTEO  . Gout   . Hyperlipemia   . Asthma     Past Surgical History  Procedure Laterality Date  . Coronary stent placement  2000, 2014  . Bilateral shoulder arthroscopy    . Coronary artery bypass graft N/A 05/01/2014    Procedure: CORONARY ARTERY BYPASS GRAFTING (CABG);  Surgeon: Alleen Borne, MD;  Location: [facility] OR;  Service: Open Heart Surgery;  Laterality: N/A;  Times 5 using left internal mammary artery and endoscopically harvested right saphenous vein  . Intraoperative transesophageal echocardiogram N/A 05/01/2014    Procedure: INTRAOPERATIVE TRANSESOPHAGEAL ECHOCARDIOGRAM;  Surgeon: Alleen Borne, MD;  Location: [facility] OR;  Service: Open Heart Surgery;  Laterality: N/A;  . Left heart catheterization with coronary angiogram N/A 07/29/2013    Procedure: LEFT HEART CATHETERIZATION WITH CORONARY ANGIOGRAM;  Surgeon: Robynn Pane, MD;  Location:  [facility] CATH LAB;  Service: Cardiovascular;  Laterality: N/A;  . Percutaneous stent intervention  07/29/2013    Procedure: PERCUTANEOUS STENT INTERVENTION;  Surgeon: Robynn Pane, MD;  Location: [facility] CATH LAB;  Service: Cardiovascular;;  . Left heart catheterization with coronary angiogram N/A 04/24/2014    Procedure: LEFT HEART CATHETERIZATION WITH CORONARY ANGIOGRAM;  Surgeon: Kathleene Hazel, MD;  Location: [facility] CATH LAB;  Service: Cardiovascular;  Laterality: N/A;      Medication List       This list is accurate as of: 01/06/15 12:46 PM.  Always use your most recent med list.               albuterol (2.5 MG/3ML) 0.083% nebulizer solution  Commonly known as:  PROVENTIL  Take 3 mLs (2.5 mg total) by nebulization every 6 (six) hours as needed for wheezing or shortness of breath.     ALPRAZolam 0.5 MG tablet  Commonly known as:  XANAX  Take 1 tablet (0.5 mg total) by mouth at bedtime as needed for anxiety.     aspirin 325 MG EC tablet  Take 1 tablet (325 mg total) by mouth daily.     azithromycin 250 MG tablet  Commonly known as:  ZITHROMAX  Take 1 tablet (250 mg total) by mouth daily. Take first 2 tablets together, then 1 every day until finished.     carvedilol 3.125 MG tablet  Commonly known as:  COREG  Take 1 tablet (3.125 mg total) by mouth 2 (two)  times daily with a meal.     esomeprazole 40 MG capsule  Commonly known as:  NEXIUM  Take 40 mg by mouth daily at 12 noon.     fluticasone 50 MCG/ACT nasal spray  Commonly known as:  FLONASE  Place 2 sprays into both nostrils daily.     furosemide 20 MG tablet  Commonly known as:  LASIX  Take 20 mg by mouth daily.     nitroGLYCERIN 0.4 MG SL tablet  Commonly known as:  NITROSTAT  Place 0.4 mg under the tongue every 5 (five) minutes as needed for chest pain.     oxyCODONE-acetaminophen 5-325 MG per tablet  Commonly known as:  PERCOCET/ROXICET  Take 1 tablet by mouth every 8 (eight) hours as needed for moderate pain  or severe pain.     pantoprazole 40 MG tablet  Commonly known as:  PROTONIX  Take 40 mg by mouth daily.     predniSONE 20 MG tablet  Commonly known as:  DELTASONE  Take 2 tablets (40 mg total) by mouth daily.     ramipril 1.25 MG capsule  Commonly known as:  ALTACE  Take 1 capsule (1.25 mg total) by mouth daily.     rosuvastatin 20 MG tablet  Commonly known as:  CRESTOR  Take 1 tablet (20 mg total) by mouth daily.     spironolactone 25 MG tablet  Commonly known as:  ALDACTONE  Take 12.5 mg by mouth daily.     traZODone 50 MG tablet  Commonly known as:  DESYREL  Take 0.5-1 tablets (25-50 mg total) by mouth at bedtime as needed for sleep.     zolpidem 5 MG tablet  Commonly known as:  AMBIEN  Take 1 tablet (5 mg total) by mouth at bedtime as needed for sleep.        Meds ordered this encounter  Medications  . fluticasone (FLONASE) 50 MCG/ACT nasal spray    Sig: Place 2 sprays into both nostrils daily.    Dispense:  16 g    Refill:  3     There is no immunization history on file for this patient.  Family History  Problem Relation Age of Onset  . CAD Father   . Heart attack Brother 44    x 3  . Heart attack Father 75  . Cancer Sister   . Cancer Mother   . Stroke Mother     History  Substance Use Topics  . Smoking status: Former Smoker -- 0.50 packs/day for 54 years    Types: Cigarettes  . Smokeless tobacco: Current User    Types: Chew  . Alcohol Use: No     Comment: former    Review of Systems   As noted in HPI  Filed Vitals:   01/06/15 1052  BP: 110/73  Pulse: 71  Temp: 98 F (36.7 C)  Resp: 16    Physical Exam  Physical Exam  HENT:  Nasal congestion no sinus tenderness  Eyes: EOM are normal. Pupils are equal, round, and reactive to light.  Cardiovascular: Normal rate and regular rhythm.   Pulmonary/Chest: Breath sounds normal. No respiratory distress. He has no wheezes. He has no rales.  Musculoskeletal: He exhibits no edema.     CBC    Component Value Date/Time   WBC 7.5 12/29/2014 0517   RBC 4.33 12/29/2014 0517   HGB 10.1* 12/29/2014 0517   HCT 33.8* 12/29/2014 0517   PLT 212 12/29/2014 0517   MCV 78.1  12/29/2014 0517   LYMPHSABS 1.4 12/29/2014 0517   MONOABS 0.6 12/29/2014 0517   EOSABS 0.2 12/29/2014 0517   BASOSABS 0.0 12/29/2014 0517    CMP     Component Value Date/Time   NA 139 12/29/2014 0517   K 4.1 12/29/2014 0517   CL 107 12/29/2014 0517   CO2 22 12/29/2014 0517   GLUCOSE 121* 12/29/2014 0517   BUN 7 12/29/2014 0517   CREATININE 0.95 12/29/2014 0517   CREATININE 1.00 12/13/2013 1115   CALCIUM 8.8 12/29/2014 0517   PROT 6.7 08/16/2014 0726   ALBUMIN 3.2* 08/16/2014 0726   AST 16 08/16/2014 0726   ALT 14 08/16/2014 0726   ALKPHOS 82 08/16/2014 0726   BILITOT 0.5 08/16/2014 0726   GFRNONAA 87* 12/29/2014 0517   GFRNONAA 81 12/13/2013 1115   GFRAA >90 12/29/2014 0517   GFRAA >89 12/13/2013 1115    Lab Results  Component Value Date/Time   CHOL 170 04/29/2014 05:00 AM    No components found for: HGA1C  Lab Results  Component Value Date/Time   AST 16 08/16/2014 07:26 AM    Assessment and Plan  Essential hypertension Blood pressure is well-controlled continued current meds.  History of bronchitis Already finished a course of antibiotic has minimal symptoms.  Nasal congestion - Plan:Trial of fluticasone (FLONASE) 50 MCG/ACT nasal spray  S/P CABG x 5 Patient is currently following up with his cardiologist.    Return in about 3 months (around 04/08/2015) for hypertension.   This note has been created with Education officer, environmental. Any transcriptional errors are unintentional.    Doris Cheadle, MD

## 2015-01-13 NOTE — Telephone Encounter (Signed)
I spoke with pt's pharmacy [facility]) and insurance is now covering Crestor.

## 2015-03-29 ENCOUNTER — Encounter (HOSPITAL_COMMUNITY): Payer: Self-pay | Admitting: *Deleted

## 2015-03-29 ENCOUNTER — Emergency Department (HOSPITAL_COMMUNITY): Payer: Medicaid Other

## 2015-03-29 DIAGNOSIS — Z7951 Long term (current) use of inhaled steroids: Secondary | ICD-10-CM | POA: Insufficient documentation

## 2015-03-29 DIAGNOSIS — Z8639 Personal history of other endocrine, nutritional and metabolic disease: Secondary | ICD-10-CM | POA: Diagnosis not present

## 2015-03-29 DIAGNOSIS — Z7982 Long term (current) use of aspirin: Secondary | ICD-10-CM | POA: Insufficient documentation

## 2015-03-29 DIAGNOSIS — M1990 Unspecified osteoarthritis, unspecified site: Secondary | ICD-10-CM | POA: Diagnosis not present

## 2015-03-29 DIAGNOSIS — Z792 Long term (current) use of antibiotics: Secondary | ICD-10-CM | POA: Insufficient documentation

## 2015-03-29 DIAGNOSIS — I252 Old myocardial infarction: Secondary | ICD-10-CM | POA: Insufficient documentation

## 2015-03-29 DIAGNOSIS — K219 Gastro-esophageal reflux disease without esophagitis: Secondary | ICD-10-CM | POA: Diagnosis not present

## 2015-03-29 DIAGNOSIS — Z9861 Coronary angioplasty status: Secondary | ICD-10-CM | POA: Diagnosis not present

## 2015-03-29 DIAGNOSIS — J45901 Unspecified asthma with (acute) exacerbation: Secondary | ICD-10-CM | POA: Insufficient documentation

## 2015-03-29 DIAGNOSIS — Z79899 Other long term (current) drug therapy: Secondary | ICD-10-CM | POA: Diagnosis not present

## 2015-03-29 DIAGNOSIS — Z9889 Other specified postprocedural states: Secondary | ICD-10-CM | POA: Insufficient documentation

## 2015-03-29 DIAGNOSIS — Z951 Presence of aortocoronary bypass graft: Secondary | ICD-10-CM | POA: Diagnosis not present

## 2015-03-29 DIAGNOSIS — I10 Essential (primary) hypertension: Secondary | ICD-10-CM | POA: Insufficient documentation

## 2015-03-29 DIAGNOSIS — R0602 Shortness of breath: Secondary | ICD-10-CM | POA: Diagnosis present

## 2015-03-29 DIAGNOSIS — Z7952 Long term (current) use of systemic steroids: Secondary | ICD-10-CM | POA: Diagnosis not present

## 2015-03-29 DIAGNOSIS — J90 Pleural effusion, not elsewhere classified: Secondary | ICD-10-CM | POA: Diagnosis not present

## 2015-03-29 DIAGNOSIS — I25119 Atherosclerotic heart disease of native coronary artery with unspecified angina pectoris: Secondary | ICD-10-CM | POA: Insufficient documentation

## 2015-03-29 DIAGNOSIS — Z87891 Personal history of nicotine dependence: Secondary | ICD-10-CM | POA: Diagnosis not present

## 2015-03-29 NOTE — ED Notes (Signed)
The pt is c/o sob for one week after he was diagnosed with bronchitis.  Today he has had more difficulty breathing,  hhns at home not helping

## 2015-03-30 ENCOUNTER — Emergency Department (HOSPITAL_COMMUNITY)
Admission: EM | Admit: 2015-03-30 | Discharge: 2015-03-30 | Disposition: A | Discharge status: Home / Self Care | Payer: Medicaid Other | Attending: Emergency Medicine | Admitting: Emergency Medicine

## 2015-03-30 DIAGNOSIS — J90 Pleural effusion, not elsewhere classified: Secondary | ICD-10-CM

## 2015-03-30 LAB — CBC WITH DIFFERENTIAL/PLATELET
Basophils Absolute: 0 10*3/uL (ref 0.0–0.1)
Basophils Relative: 0 % (ref 0–1)
Eosinophils Absolute: 0.2 10*3/uL (ref 0.0–0.7)
Eosinophils Relative: 3 % (ref 0–5)
HCT: 34.2 % — ABNORMAL LOW (ref 39.0–52.0)
Hemoglobin: 10 g/dL — ABNORMAL LOW (ref 13.0–17.0)
LYMPHS ABS: 1.4 10*3/uL (ref 0.7–4.0)
Lymphocytes Relative: 20 % (ref 12–46)
MCH: 21.8 pg — ABNORMAL LOW (ref 26.0–34.0)
MCHC: 29.2 g/dL — ABNORMAL LOW (ref 30.0–36.0)
MCV: 74.7 fL — AB (ref 78.0–100.0)
Monocytes Absolute: 0.5 10*3/uL (ref 0.1–1.0)
Monocytes Relative: 8 % (ref 3–12)
NEUTROS ABS: 4.9 10*3/uL (ref 1.7–7.7)
NEUTROS PCT: 69 % (ref 43–77)
PLATELETS: 231 10*3/uL (ref 150–400)
RBC: 4.58 MIL/uL (ref 4.22–5.81)
RDW: 20.1 % — ABNORMAL HIGH (ref 11.5–15.5)
WBC: 7.1 10*3/uL (ref 4.0–10.5)

## 2015-03-30 LAB — BASIC METABOLIC PANEL
ANION GAP: 8 (ref 5–15)
BUN: 9 mg/dL (ref 6–20)
CO2: 22 mmol/L (ref 22–32)
Calcium: 8.7 mg/dL — ABNORMAL LOW (ref 8.9–10.3)
Chloride: 109 mmol/L (ref 101–111)
Creatinine, Ser: 0.97 mg/dL (ref 0.61–1.24)
GFR calc Af Amer: >60 mL/min (ref >60)
GLUCOSE: 120 mg/dL — AB (ref 65–99)
POTASSIUM: 3.6 mmol/L (ref 3.5–5.1)
SODIUM: 139 mmol/L (ref 135–145)

## 2015-03-30 LAB — TROPONIN I: Troponin I: <0.03 ng/mL (ref <0.031)

## 2015-03-30 LAB — BRAIN NATRIURETIC PEPTIDE: B NATRIURETIC PEPTIDE 5: 485.4 pg/mL — AB (ref 0.0–100.0)

## 2015-03-30 MED ORDER — FUROSEMIDE 40 MG PO TABS: 40.0000 mg | ORAL_TABLET | Freq: Every day | ORAL | Status: AC

## 2015-03-30 MED ORDER — FUROSEMIDE 20 MG PO TABS
40.0000 mg | ORAL_TABLET | Freq: Once | ORAL | Status: AC
  Administered 2015-03-30: 40 mg via ORAL
  Filled 2015-03-30: qty 2

## 2015-03-30 NOTE — Discharge Instructions (Signed)
Heart Failure Mr. Rembert, Dr. Sharyn Lull wants to see you in clinic tomorrow. Call to obtain a same day appointment. He also advises for you to restart Lasix. If any symptoms worsen come back to the emergency department immediately. Thank you. Heart failure means your heart has trouble pumping blood. This makes it hard for your body to work well. Heart failure is usually a long-term (chronic) condition. You must take good care of yourself and follow your doctor's treatment plan. HOME CARE  Take your heart medicine as told by your doctor.  Do not stop taking medicine unless your doctor tells you to.  Do not skip any dose of medicine.  Refill your medicines before they run out.  Take other medicines only as told by your doctor or pharmacist.  Stay active if told by your doctor. The elderly and people with severe heart failure should talk with a doctor about physical activity.  Eat heart-healthy foods. Choose foods that are without trans fat and are low in saturated fat, cholesterol, and salt (sodium). This includes fresh or frozen fruits and vegetables, fish, lean meats, fat-free or low-fat dairy foods, whole grains, and high-fiber foods. Lentils and dried peas and beans (legumes) are also good choices.  Limit salt if told by your doctor.  Cook in a healthy way. Roast, grill, broil, bake, poach, steam, or stir-fry foods.  Limit fluids as told by your doctor.  Weigh yourself every morning. Do this after you pee (urinate) and before you eat breakfast. Write down your weight to give to your doctor.  Take your blood pressure and write it down if your doctor tells you to.  Ask your doctor how to check your pulse. Check your pulse as told.  Lose weight if told by your doctor.  Stop smoking or chewing tobacco. Do not use gum or patches that help you quit without your doctor's approval.  Schedule and go to doctor visits as told.  Nonpregnant women should have no more than 1 drink a day.  Men should have no more than 2 drinks a day. Talk to your doctor about drinking alcohol.  Stop illegal drug use.  Stay current with shots (immunizations).  Manage your health conditions as told by your doctor.  Learn to manage your stress.  Rest when you are tired.  If it is really hot outside:  Avoid intense activities.  Use air conditioning or fans, or get in a cooler place.  Avoid caffeine and alcohol.  Wear loose-fitting, lightweight, and light-colored clothing.  If it is really cold outside:  Avoid intense activities.  Layer your clothing.  Wear mittens or gloves, a hat, and a scarf when going outside.  Avoid alcohol.  Learn about heart failure and get support as needed.  Get help to maintain or improve your quality of life and your ability to care for yourself as needed. GET HELP IF:   You gain 03 lb/1.4 kg or more in 1 day or 05 lb/2.3 kg in a week.  You are more short of breath than usual.  You cannot do your normal activities.  You tire easily.  You cough more than normal, especially with activity.  You have any or more puffiness (swelling) in areas such as your hands, feet, ankles, or belly (abdomen).  You cannot sleep because it is hard to breathe.  You feel like your heart is beating fast (palpitations).  You get dizzy or light-headed when you stand up. GET HELP RIGHT AWAY IF:   You have  trouble breathing.  There is a change in mental status, such as becoming less alert or not being able to focus.  You have chest pain or discomfort.  You faint. MAKE SURE YOU:   Understand these instructions.  Will watch your condition.  Will get help right away if you are not doing well or get worse. Document Released: 07/12/2008 Document Revised: 02/17/2014 Document Reviewed: 11/19/2012 [facility] Patient Information 2015 [place], [place]. This information is not intended to replace advice given to you by your health care provider. Make sure you discuss  any questions you have with your health care provider.

## 2015-03-30 NOTE — ED Provider Notes (Signed)
CSN: [id number]     Arrival date & time 03/29/15  2318 History   None    This chart was scribed for Tomasita Crumble, MD by Arlan Organ, ED Scribe. This patient was seen in room D33C/D33C and the patient's care was started 2:19 AM.   Chief Complaint  Patient presents with  . Shortness of Breath   The history is provided by the patient. No language interpreter was used.    HPI Comments: Peter SWEIGERT Sr. is a 64 y.o. male with a PMHx of CAD, GERD, HTN, MI, and hyperlipidemia who presents to the Emergency Department complaining of intermittent, ongoing shortness of breath x 1 week, worsened today. SOB is worsened with exertion and when in a supine position. No alleviating factors at this time. At home breathing treatments attempted prior to arrival without any long term improvement. No recent fever, chills, nausea, vomiting, or abdominal pain. PSHx includes CABG- 2015, coronary stent placement- 2000/2014, and percutaneous stent intervention- 2014. Pt was recently switched from Lasix to a different fluid pill. Pt with known allergies to Codeine and Oxycodone.  Past Medical History  Diagnosis Date  . Coronary artery disease     DES proximal LAD  . GERD (gastroesophageal reflux disease)   . MI (mitral incompetence)     anterolateral MI October 2014  . HTN (hypertension)   . Anginal pain   . Myocardial infarction   . Shortness of breath   . Arthritis     OSTEO  . Gout   . Hyperlipemia   . Asthma    Past Surgical History  Procedure Laterality Date  . Coronary stent placement  2000, 2014  . Bilateral shoulder arthroscopy    . Coronary artery bypass graft N/A 05/01/2014    Procedure: CORONARY ARTERY BYPASS GRAFTING (CABG);  Surgeon: Alleen Borne, MD;  Location: [facility] OR;  Service: Open Heart Surgery;  Laterality: N/A;  Times 5 using left internal mammary artery and endoscopically harvested right saphenous vein  . Intraoperative transesophageal echocardiogram N/A 05/01/2014    Procedure:  INTRAOPERATIVE TRANSESOPHAGEAL ECHOCARDIOGRAM;  Surgeon: Alleen Borne, MD;  Location: [facility] OR;  Service: Open Heart Surgery;  Laterality: N/A;  . Left heart catheterization with coronary angiogram N/A 07/29/2013    Procedure: LEFT HEART CATHETERIZATION WITH CORONARY ANGIOGRAM;  Surgeon: Robynn Pane, MD;  Location: [facility] CATH LAB;  Service: Cardiovascular;  Laterality: N/A;  . Percutaneous stent intervention  07/29/2013    Procedure: PERCUTANEOUS STENT INTERVENTION;  Surgeon: Robynn Pane, MD;  Location: [facility] CATH LAB;  Service: Cardiovascular;;  . Left heart catheterization with coronary angiogram N/A 04/24/2014    Procedure: LEFT HEART CATHETERIZATION WITH CORONARY ANGIOGRAM;  Surgeon: Kathleene Hazel, MD;  Location: [facility] CATH LAB;  Service: Cardiovascular;  Laterality: N/A;   Family History  Problem Relation Age of Onset  . CAD Father   . Heart attack Brother 44    x 3  . Heart attack Father 18  . Cancer Sister   . Cancer Mother   . Stroke Mother    History  Substance Use Topics  . Smoking status: Former Smoker -- 0.50 packs/day for 54 years    Types: Cigarettes  . Smokeless tobacco: Current User    Types: Chew  . Alcohol Use: No     Comment: former    Review of Systems  Constitutional: Negative for fever and chills.  Respiratory: Positive for shortness of breath. Negative for cough.   Cardiovascular: Negative for chest pain.  Gastrointestinal: Negative for nausea, vomiting and abdominal pain.  All other systems reviewed and are negative.     Allergies  Codeine and Oxycodone  Home Medications   Prior to Admission medications   Medication Sig Start Date End Date Taking? Authorizing Provider  albuterol (PROVENTIL) (2.5 MG/3ML) 0.083% nebulizer solution Take 3 mLs (2.5 mg total) by nebulization every 6 (six) hours as needed for wheezing or shortness of breath. 12/29/14   Lorre Nick, MD  ALPRAZolam Prudy Feeler) 0.5 MG tablet Take 1 tablet (0.5 mg total) by mouth at  bedtime as needed for anxiety. 06/05/14   Doris Cheadle, MD  aspirin EC 325 MG EC tablet Take 1 tablet (325 mg total) by mouth daily. 05/06/14   Donielle Margaretann Loveless, PA-C  azithromycin (ZITHROMAX) 250 MG tablet Take 1 tablet (250 mg total) by mouth daily. Take first 2 tablets together, then 1 every day until finished. 12/29/14   Lorre Nick, MD  carvedilol (COREG) 3.125 MG tablet Take 1 tablet (3.125 mg total) by mouth 2 (two) times daily with a meal. 06/03/14   Kathleene Hazel, MD  esomeprazole (NEXIUM) 40 MG capsule Take 40 mg by mouth daily at 12 noon.    Historical Provider, MD  fluticasone (FLONASE) 50 MCG/ACT nasal spray Place 2 sprays into both nostrils daily. 01/06/15   Doris Cheadle, MD  furosemide (LASIX) 20 MG tablet Take 20 mg by mouth daily.  11/17/14   Historical Provider, MD  nitroGLYCERIN (NITROSTAT) 0.4 MG SL tablet Place 0.4 mg under the tongue every 5 (five) minutes as needed for chest pain.    Historical Provider, MD  oxyCODONE-acetaminophen (PERCOCET/ROXICET) 5-325 MG per tablet Take 1 tablet by mouth every 8 (eight) hours as needed for moderate pain or severe pain. 05/29/14   Delight Ovens, MD  pantoprazole (PROTONIX) 40 MG tablet Take 40 mg by mouth daily.    Historical Provider, MD  predniSONE (DELTASONE) 20 MG tablet Take 2 tablets (40 mg total) by mouth daily. 12/29/14   Lorre Nick, MD  ramipril (ALTACE) 1.25 MG capsule Take 1 capsule (1.25 mg total) by mouth daily. Patient not taking: Reported on 12/29/2014 05/29/14   Beatrice Lecher, PA-C  rosuvastatin (CRESTOR) 20 MG tablet Take 1 tablet (20 mg total) by mouth daily. Patient not taking: Reported on 12/29/2014 06/04/14   Kathleene Hazel, MD  spironolactone (ALDACTONE) 25 MG tablet Take 12.5 mg by mouth daily.    Historical Provider, MD  traZODone (DESYREL) 50 MG tablet Take 0.5-1 tablets (25-50 mg total) by mouth at bedtime as needed for sleep. Patient not taking: Reported on 09/14/2014 09/02/14   Doris Cheadle,  MD  zolpidem (AMBIEN) 5 MG tablet Take 1 tablet (5 mg total) by mouth at bedtime as needed for sleep. Patient not taking: Reported on 09/10/2014 08/16/14   Arby Barrette, MD   Triage Vitals: BP 133/84 mmHg  Pulse 76  Temp(Src) 98 F (36.7 C) (Oral)  Resp 28  Ht 5\' 8"  (1.727 m)  Wt 217 lb 1 oz (98.459 kg)  BMI 33.01 kg/m2  SpO2 97%   Physical Exam  Constitutional: He is oriented to person, place, and time. Vital signs are normal. He appears well-developed and well-nourished.  Non-toxic appearance. He does not appear ill. No distress.  HENT:  Head: Normocephalic and atraumatic.  Nose: Nose normal.  Mouth/Throat: Oropharynx is clear and moist. No oropharyngeal exudate.  Eyes: Conjunctivae and EOM are normal. Pupils are equal, round, and reactive to light. No scleral icterus.  Neck: Normal range of motion. Neck supple. No tracheal deviation, no edema, no erythema and normal range of motion present. No thyroid mass and no thyromegaly present.  Cardiovascular: Normal rate, regular rhythm, S1 normal, S2 normal, normal heart sounds, intact distal pulses and normal pulses.  Exam reveals no gallop and no friction rub.   No murmur heard. Pulses:      Radial pulses are 2+ on the right side, and 2+ on the left side.       Dorsalis pedis pulses are 2+ on the right side, and 2+ on the left side.  Pulmonary/Chest: Effort normal. No respiratory distress. He has no wheezes. He has no rhonchi. He has no rales.  Crackles heard in bilateral posterior fields  Abdominal: Soft. Normal appearance and bowel sounds are normal. He exhibits no distension, no ascites and no mass. There is no hepatosplenomegaly. There is no tenderness. There is no rebound, no guarding and no CVA tenderness.  Musculoskeletal: Normal range of motion. He exhibits no edema or tenderness.  Lymphadenopathy:    He has no cervical adenopathy.  Neurological: He is alert and oriented to person, place, and time. He has normal strength. No  cranial nerve deficit or sensory deficit.  Skin: Skin is warm, dry and intact. No petechiae and no rash noted. He is not diaphoretic. No erythema. No pallor.  Psychiatric: He has a normal mood and affect. His behavior is normal. Judgment normal.  Nursing note and vitals reviewed.   ED Course  Procedures (including critical care time)  DIAGNOSTIC STUDIES: Oxygen Saturation is 97% on RA, Normal by my interpretation.    COORDINATION OF CARE: 2:28 AM- Will order CXR, EKG, BNP, BMP, troponin I. Discussed treatment plan with pt at bedside and pt agreed to plan.     Labs Review Labs Reviewed  BASIC METABOLIC PANEL - Abnormal; Notable for the following:    Glucose, Bld 120 (*)    Calcium 8.7 (*)    All other components within normal limits  BRAIN NATRIURETIC PEPTIDE - Abnormal; Notable for the following:    B Natriuretic Peptide 485.4 (*)    All other components within normal limits  CBC WITH DIFFERENTIAL/PLATELET - Abnormal; Notable for the following:    Hemoglobin 10.0 (*)    HCT 34.2 (*)    MCV 74.7 (*)    MCH 21.8 (*)    MCHC 29.2 (*)    RDW 20.1 (*)    All other components within normal limits  TROPONIN I    Imaging Review Dg Chest 2 View  03/29/2015   CLINICAL DATA:  Shortness of breath for 1 week. History of bronchitis, asthma, myocardial infarction.  EXAM: CHEST  2 VIEW  COMPARISON:  Chest radiograph December 29, 2014  FINDINGS: Cardiac silhouette is mildly enlarged, similar. Mediastinal silhouette is nonsuspicious, status post median sternotomy. Pulmonary vascular congestion slightly increasing interstitial prominence without focal consolidation. Small posterior pleural effusion. Increased lung volumes with somewhat flattened hemidiaphragms. No pneumothorax. Fractured screws within the scapula bilaterally. Soft tissue planes are nonsuspicious.  IMPRESSION: Mild cardiomegaly. Pulmonary vascular congestion increasing mild interstitial prominence with small pleural effusion suggests  pulmonary edema, less likely atypical infection in a background of probable COPD.   Electronically Signed   By: Awilda Metro M.D.   On: 03/29/2015 23:49     EKG Interpretation   Date/Time:  Sunday March 29 2015 23:26:28 EDT Ventricular Rate:  74 PR Interval:  182 QRS Duration: 132 QT Interval:  420 QTC Calculation: 466  R Axis:   152 Text Interpretation:  Normal sinus rhythm Right bundle branch block  Anterolateral infarct , age undetermined Abnormal ECG No significant  change since last tracing Confirmed by Erroll Luna [phone] on  03/30/2015 2:44:39 AM      MDM   Final diagnoses:  None    patient presents to the emergency department for worsening shortness of breath. He describes dyspnea on exertion and sleep orthopnea. He was recently taken off of his Lasix. I spoke with Dr. Sharyn Lull  Who is his primary cardiologits. He recommends for the patient to go back on Lasix 40 mg daily. He would like to see him in clinic tomorrow. I told patient to call to be seen for further management. He is not hypoxic here. There is no O2 requirement. His laboratory studies are unremarkable. His chest x-ray shows small pleural effusions. I believe the patient is safe for discharge. He appears comfortable in the room, there is no increased worker breathing. His vital signs remain within his normal limits and he is safe for discharge.   he was given 40 mg of Lasix in the emergency department. His EKG is unchanged.  I personally performed the services described in this documentation, which was scribed in my presence. The recorded information has been reviewed and is accurate.   Tomasita Crumble, MD 03/30/15 [phone]

## 2015-04-16 ENCOUNTER — Emergency Department (HOSPITAL_COMMUNITY): Payer: Medicaid Other

## 2015-04-16 ENCOUNTER — Encounter (HOSPITAL_COMMUNITY): Payer: Self-pay | Admitting: Emergency Medicine

## 2015-04-16 ENCOUNTER — Emergency Department (HOSPITAL_COMMUNITY)
Admission: EM | Admit: 2015-04-16 | Discharge: 2015-04-16 | Disposition: A | Discharge status: Home / Self Care | Payer: Medicaid Other | Attending: Emergency Medicine | Admitting: Emergency Medicine

## 2015-04-16 DIAGNOSIS — J45901 Unspecified asthma with (acute) exacerbation: Secondary | ICD-10-CM | POA: Diagnosis not present

## 2015-04-16 DIAGNOSIS — Z7982 Long term (current) use of aspirin: Secondary | ICD-10-CM | POA: Diagnosis not present

## 2015-04-16 DIAGNOSIS — Z8639 Personal history of other endocrine, nutritional and metabolic disease: Secondary | ICD-10-CM | POA: Diagnosis not present

## 2015-04-16 DIAGNOSIS — I2510 Atherosclerotic heart disease of native coronary artery without angina pectoris: Secondary | ICD-10-CM | POA: Insufficient documentation

## 2015-04-16 DIAGNOSIS — Z9861 Coronary angioplasty status: Secondary | ICD-10-CM | POA: Insufficient documentation

## 2015-04-16 DIAGNOSIS — I252 Old myocardial infarction: Secondary | ICD-10-CM | POA: Diagnosis not present

## 2015-04-16 DIAGNOSIS — Y9389 Activity, other specified: Secondary | ICD-10-CM | POA: Diagnosis not present

## 2015-04-16 DIAGNOSIS — I10 Essential (primary) hypertension: Secondary | ICD-10-CM | POA: Insufficient documentation

## 2015-04-16 DIAGNOSIS — K219 Gastro-esophageal reflux disease without esophagitis: Secondary | ICD-10-CM | POA: Insufficient documentation

## 2015-04-16 DIAGNOSIS — Z9889 Other specified postprocedural states: Secondary | ICD-10-CM | POA: Insufficient documentation

## 2015-04-16 DIAGNOSIS — M7031 Other bursitis of elbow, right elbow: Secondary | ICD-10-CM | POA: Insufficient documentation

## 2015-04-16 DIAGNOSIS — M7021 Olecranon bursitis, right elbow: Secondary | ICD-10-CM

## 2015-04-16 DIAGNOSIS — Z79899 Other long term (current) drug therapy: Secondary | ICD-10-CM | POA: Insufficient documentation

## 2015-04-16 DIAGNOSIS — M1990 Unspecified osteoarthritis, unspecified site: Secondary | ICD-10-CM | POA: Diagnosis not present

## 2015-04-16 DIAGNOSIS — Z87891 Personal history of nicotine dependence: Secondary | ICD-10-CM | POA: Diagnosis not present

## 2015-04-16 DIAGNOSIS — M25521 Pain in right elbow: Secondary | ICD-10-CM | POA: Diagnosis present

## 2015-04-16 DIAGNOSIS — Z951 Presence of aortocoronary bypass graft: Secondary | ICD-10-CM | POA: Diagnosis not present

## 2015-04-16 MED ORDER — KETOROLAC TROMETHAMINE 60 MG/2ML IM SOLN
60.0000 mg | Freq: Once | INTRAMUSCULAR | Status: AC
  Administered 2015-04-16: 60 mg via INTRAMUSCULAR
  Filled 2015-04-16: qty 2

## 2015-04-16 MED ORDER — PREDNISONE 20 MG PO TABS: ORAL_TABLET | ORAL | Status: DC

## 2015-04-16 MED ORDER — IBUPROFEN 200 MG PO TABS
400.0000 mg | ORAL_TABLET | Freq: Once | ORAL | Status: AC
  Administered 2015-04-16: 400 mg via ORAL
  Filled 2015-04-16: qty 2

## 2015-04-16 MED ORDER — PREDNISONE 20 MG PO TABS
60.0000 mg | ORAL_TABLET | Freq: Once | ORAL | Status: AC
  Administered 2015-04-16: 60 mg via ORAL
  Filled 2015-04-16: qty 3

## 2015-04-16 MED ORDER — IBUPROFEN 600 MG PO TABS: 600.0000 mg | ORAL_TABLET | Freq: Three times a day (TID) | ORAL | Status: DC

## 2015-04-16 NOTE — ED Provider Notes (Signed)
CSN: [id number]     Arrival date & time 04/16/15  0107 History  This chart was scribed for Loren Racer, MD by Merlene Laughter, ED Scribe. This patient was seen in room A07C/A07C and the patient's care was started at 2:53 AM.      Chief Complaint  Patient presents with  . Elbow Pain     The history is provided by the patient. No language interpreter was used.   HPI Comments: Peter MCCOLLAM Sr. is a 64 y.o. male who presents to the Emergency Department complaining of constant, moderate, right elbow pain radiating down his arm to his right hand onset yesterday (04/15/15). Patient reports using his right arm normally and states pain started while he was taking a nap, lying on his right side. Patient complains of associated right elbow swelling. Patient denies any injury. Patient states he had cortisone shot last week, but is unsure if this is related to his pain. He also states he recently had a pin in his right shoulder break while he had open heart surgery. He states he is retired and doesn't do a lot of physical activity with his arm and elbow. Patient denies fever and leg swelling. Patient states he has a Hx of MI. Patient is right hand dominant.  Patient also complains of SOB with associated wheezing, however, he states this is chronic and does not wish to be treated for these symptoms.    Past Medical History  Diagnosis Date  . Coronary artery disease     DES proximal LAD  . GERD (gastroesophageal reflux disease)   . MI (mitral incompetence)     anterolateral MI October 2014  . HTN (hypertension)   . Anginal pain   . Myocardial infarction   . Shortness of breath   . Arthritis     OSTEO  . Gout   . Hyperlipemia   . Asthma    Past Surgical History  Procedure Laterality Date  . Coronary stent placement  2000, 2014  . Bilateral shoulder arthroscopy    . Coronary artery bypass graft N/A 05/01/2014    Procedure: CORONARY ARTERY BYPASS GRAFTING (CABG);  Surgeon: Alleen Borne,  MD;  Location: [facility] OR;  Service: Open Heart Surgery;  Laterality: N/A;  Times 5 using left internal mammary artery and endoscopically harvested right saphenous vein  . Intraoperative transesophageal echocardiogram N/A 05/01/2014    Procedure: INTRAOPERATIVE TRANSESOPHAGEAL ECHOCARDIOGRAM;  Surgeon: Alleen Borne, MD;  Location: [facility] OR;  Service: Open Heart Surgery;  Laterality: N/A;  . Left heart catheterization with coronary angiogram N/A 07/29/2013    Procedure: LEFT HEART CATHETERIZATION WITH CORONARY ANGIOGRAM;  Surgeon: Robynn Pane, MD;  Location: [facility] CATH LAB;  Service: Cardiovascular;  Laterality: N/A;  . Percutaneous stent intervention  07/29/2013    Procedure: PERCUTANEOUS STENT INTERVENTION;  Surgeon: Robynn Pane, MD;  Location: [facility] CATH LAB;  Service: Cardiovascular;;  . Left heart catheterization with coronary angiogram N/A 04/24/2014    Procedure: LEFT HEART CATHETERIZATION WITH CORONARY ANGIOGRAM;  Surgeon: Kathleene Hazel, MD;  Location: [facility] CATH LAB;  Service: Cardiovascular;  Laterality: N/A;   Family History  Problem Relation Age of Onset  . CAD Father   . Heart attack Brother 44    x 3  . Heart attack Father 40  . Cancer Sister   . Cancer Mother   . Stroke Mother    History  Substance Use Topics  . Smoking status: Former Smoker -- 0.50 packs/day for 54 years  Types: Cigarettes  . Smokeless tobacco: Current User    Types: Chew  . Alcohol Use: No     Comment: former    Review of Systems  Constitutional: Negative for fever and chills.  Respiratory: Positive for shortness of breath and wheezing. Negative for cough.   Cardiovascular: Negative for leg swelling.  Musculoskeletal: Positive for arthralgias.  Skin: Negative for color change, rash and wound.  Neurological: Negative for weakness and numbness.  All other systems reviewed and are negative.     Allergies  Codeine and Oxycodone  Home Medications   Prior to Admission medications    Medication Sig Start Date End Date Taking? Authorizing Provider  albuterol (PROVENTIL) (2.5 MG/3ML) 0.083% nebulizer solution Take 3 mLs (2.5 mg total) by nebulization every 6 (six) hours as needed for wheezing or shortness of breath. 12/29/14   Lorre Nick, MD  ALPRAZolam Prudy Feeler) 0.5 MG tablet Take 1 tablet (0.5 mg total) by mouth at bedtime as needed for anxiety. 06/05/14   Doris Cheadle, MD  aspirin EC 325 MG EC tablet Take 1 tablet (325 mg total) by mouth daily. 05/06/14   Donielle Margaretann Loveless, PA-C  carvedilol (COREG) 3.125 MG tablet Take 1 tablet (3.125 mg total) by mouth 2 (two) times daily with a meal. 06/03/14   Kathleene Hazel, MD  esomeprazole (NEXIUM) 40 MG capsule Take 40 mg by mouth daily at 12 noon.    Historical Provider, MD  fluticasone (FLONASE) 50 MCG/ACT nasal spray Place 2 sprays into both nostrils daily. Patient taking differently: Place 2 sprays into both nostrils daily as needed for allergies.  01/06/15   Doris Cheadle, MD  furosemide (LASIX) 40 MG tablet Take 1 tablet (40 mg total) by mouth daily. 03/30/15   Tomasita Crumble, MD  ibuprofen (ADVIL,MOTRIN) 600 MG tablet Take 1 tablet (600 mg total) by mouth 3 (three) times daily after meals. 04/16/15   Loren Racer, MD  nitroGLYCERIN (NITROSTAT) 0.4 MG SL tablet Place 0.4 mg under the tongue every 5 (five) minutes as needed for chest pain.    Historical Provider, MD  oxyCODONE-acetaminophen (PERCOCET/ROXICET) 5-325 MG per tablet Take 1 tablet by mouth every 8 (eight) hours as needed for moderate pain or severe pain. 05/29/14   Delight Ovens, MD  pantoprazole (PROTONIX) 40 MG tablet Take 40 mg by mouth daily.    Historical Provider, MD  predniSONE (DELTASONE) 20 MG tablet 3 tabs po day one, then 2 tabs daily x 4 days 04/16/15   Loren Racer, MD  spironolactone (ALDACTONE) 25 MG tablet Take 12.5 mg by mouth daily.    Historical Provider, MD  traZODone (DESYREL) 50 MG tablet Take 0.5-1 tablets (25-50 mg total) by mouth at  bedtime as needed for sleep. Patient not taking: Reported on 09/14/2014 09/02/14   Doris Cheadle, MD  zolpidem (AMBIEN) 5 MG tablet Take 1 tablet (5 mg total) by mouth at bedtime as needed for sleep. Patient not taking: Reported on 09/10/2014 08/16/14   Arby Barrette, MD   Triage Vitals: BP 142/79 mmHg  Pulse 75  Temp(Src) 97.7 F (36.5 C) (Oral)  Resp 22  Wt 207 lb 12.8 oz (94.257 kg)  SpO2 98% Physical Exam  Constitutional: He is oriented to person, place, and time. He appears well-developed and well-nourished. No distress.  HENT:  Head: Normocephalic and atraumatic.  Mouth/Throat: Oropharynx is clear and moist.  Eyes: EOM are normal. Pupils are equal, round, and reactive to light.  Neck: Normal range of motion. Neck supple.  Cardiovascular:  Normal rate and regular rhythm.   Pulmonary/Chest: Effort normal. No respiratory distress. He has wheezes. He has no rales.  Abdominal: Soft. Bowel sounds are normal. He exhibits no distension and no mass. There is no tenderness. There is no rebound and no guarding.  Musculoskeletal: Normal range of motion. He exhibits no edema or tenderness.  Issue with tenderness to palpation over the olecranon process of the right elbow. There is mild warmth without erythema. No obvious swelling.  Neurological: He is alert and oriented to person, place, and time.  Skin: Skin is warm and dry. No rash noted. No erythema.  Psychiatric: He has a normal mood and affect. His behavior is normal.  Nursing note and vitals reviewed.   ED Course  Procedures  DIAGNOSTIC STUDIES: Oxygen Saturation is 98% on room air, normal by my interpretation.    COORDINATION OF CARE: 2:59 AM- Discussed plan for diagnostic imaging. Pt advised of plan for treatment, which includes medication and pt agrees.  Labs Review Labs Reviewed - No data to display  Imaging Review No results found.   EKG Interpretation None      MDM   Final diagnoses:  Bursitis of elbow, right    I personally performed the services described in this documentation, which was scribed in my presence. The recorded information has been reviewed and is accurate.  Patient with evidence of bursitis. Have low suspicion for septic joint. Patient is well-appearing. Patient with wheezes throughout. Offered breathing treatment though patient declines and declines any workup for the patient's shortness of breath.    Loren Racer, MD 04/21/15 [phone]

## 2015-04-16 NOTE — Discharge Instructions (Signed)
Bursitis °Bursitis is a swelling and soreness (inflammation) of a fluid-filled sac (bursa) that overlies and protects a joint. It can be caused by injury, overuse of the joint, arthritis or infection. The joints most likely to be affected are the elbows, shoulders, hips and knees. °HOME CARE INSTRUCTIONS  °· Apply ice to the affected area for 15-20 minutes each hour while awake for 2 days. Put the ice in a plastic bag and place a towel between the bag of ice and your skin. °· Rest the injured joint as much as possible, but continue to put the joint through a full range of motion, 4 times per day. (The shoulder joint especially becomes rapidly "frozen" if not used.) When the pain lessens, begin normal slow movements and usual activities. °· Only take over-the-counter or prescription medicines for pain, discomfort or fever as directed by your caregiver. °· Your caregiver may recommend draining the bursa and injecting medicine into the bursa. This may help the healing process. °· Follow all instructions for follow-up with your caregiver. This includes any orthopedic referrals, physical therapy and rehabilitation. Any delay in obtaining necessary care could result in a delay or failure of the bursitis to heal and chronic pain. °SEEK IMMEDIATE MEDICAL CARE IF:  °· Your pain increases even during treatment. °· You develop an oral temperature above 102° F (38.9° C) and have heat and inflammation over the involved bursa. °MAKE SURE YOU:  °· Understand these instructions. °· Will watch your condition. °· Will get help right away if you are not doing well or get worse. °Document Released: 09/30/2000 Document Revised: 12/26/2011 Document Reviewed: 12/23/2013 °Exit[employer]® Patient Information ©2015 [employer]. This information is not intended to replace advice given to you by your health care provider. Make sure you discuss any questions you have with your health care provider. ° °

## 2015-04-16 NOTE — ED Notes (Signed)
Pt. reports progressing right elbow pain / swelling onset this week , denies injury / no fever .
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
## 2015-07-07 ENCOUNTER — Other Ambulatory Visit: Payer: Self-pay

## 2015-07-07 DIAGNOSIS — R0981 Nasal congestion: Secondary | ICD-10-CM

## 2015-07-16 IMAGING — CR DG CHEST 2V
2 series · 2 of 2 positions shown · non-contrast
Comparison: DG CHEST 2 VIEW dated 11/10/2013;

CLINICAL DATA: Chest pain. Myocardial infarction several weeks ago.
coronary artery disease.

EXAM:
CHEST  2 VIEW

[w chest pa]
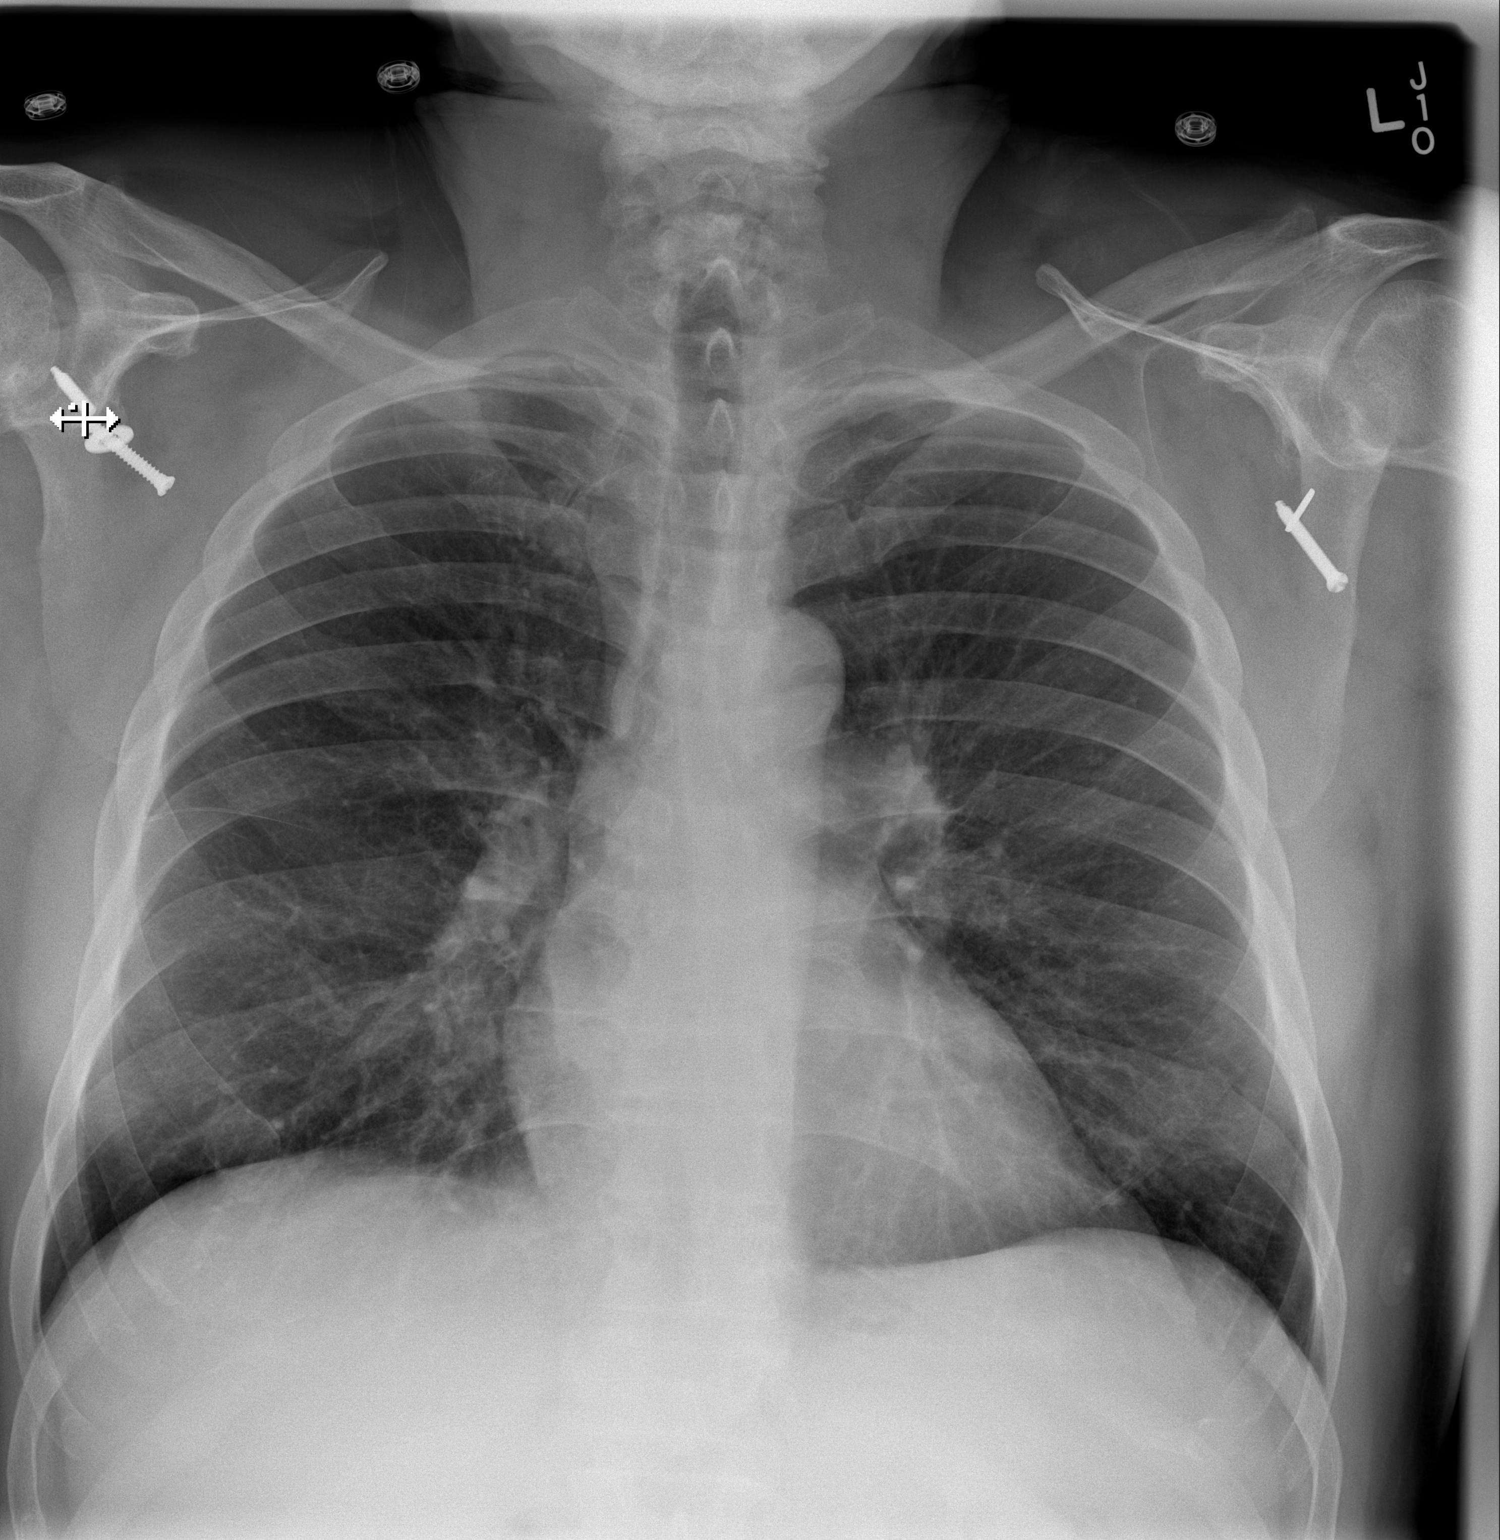

[w chest lat]
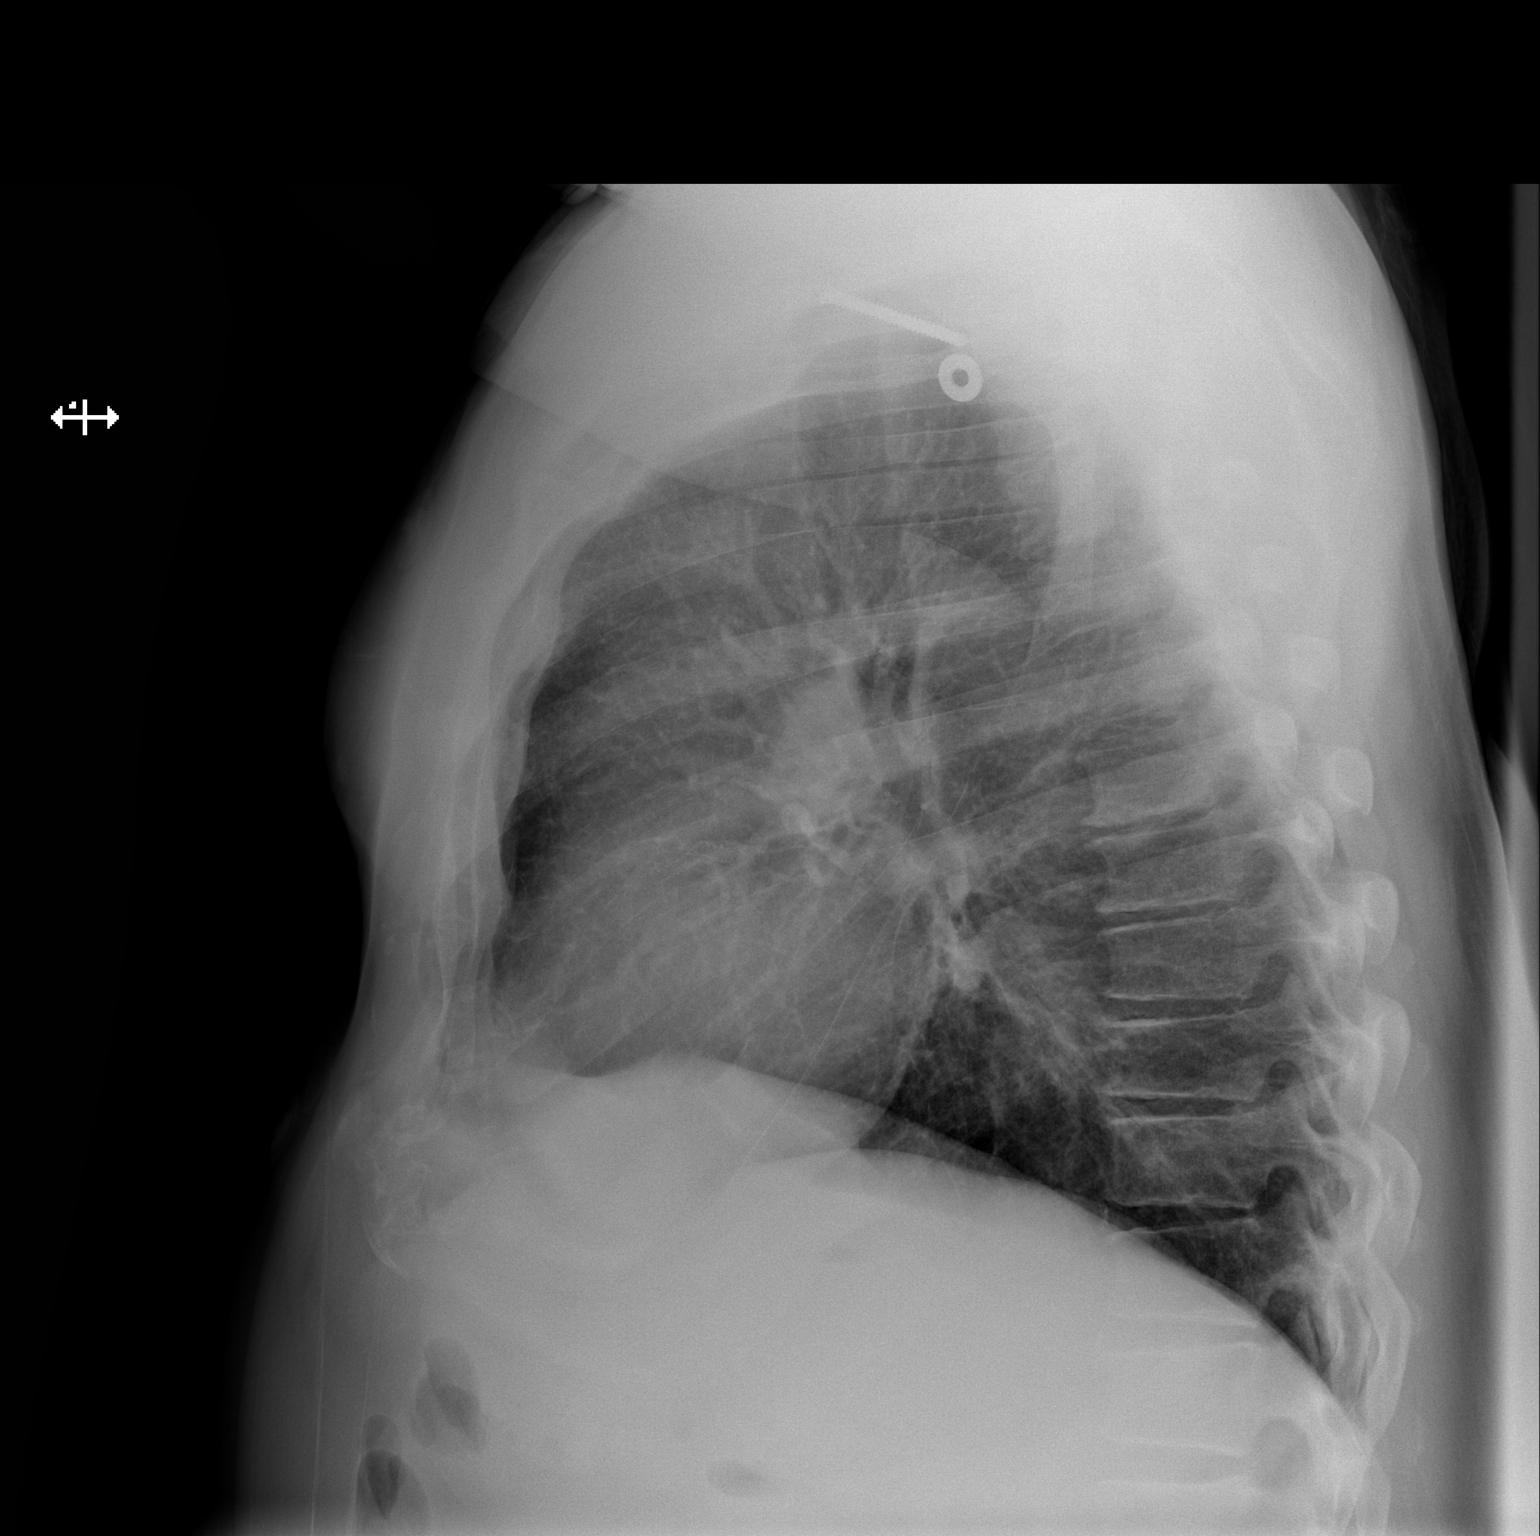

[2 of 2 positions shown; findings below may reference images not displayed]

DG THORACIC SPINE
W/SWIMMERS dated 11/10/2013; CT ANGIO CHEST W/CM &/OR WO/CM dated
07/29/2013
FINDINGS: Screws with washers project over the lateral scapula bilaterally.
The right screw appears chronically fractured.

The lungs appear clear. Cardiac and mediastinal contours normal. No
pleural effusion identified. Mild thoracic spondylosis noted.
IMPRESSION: 1. No cardiomegaly or edema.
2. Washer and screw fixators in the scapula bilaterally; the right
scapular screw is chronically fractured.
3. Thoracic spondylosis.

## 2015-08-04 ENCOUNTER — Ambulatory Visit: Payer: Medicaid Other | Admitting: Family Medicine

## 2015-08-05 ENCOUNTER — Encounter (HOSPITAL_COMMUNITY): Payer: Self-pay | Admitting: Neurology

## 2015-08-05 ENCOUNTER — Emergency Department (HOSPITAL_COMMUNITY)
Admission: EM | Admit: 2015-08-05 | Discharge: 2015-08-05 | Disposition: A | Discharge status: Home / Self Care | Payer: Medicaid Other | Attending: Emergency Medicine | Admitting: Emergency Medicine

## 2015-08-05 DIAGNOSIS — I25119 Atherosclerotic heart disease of native coronary artery with unspecified angina pectoris: Secondary | ICD-10-CM | POA: Diagnosis not present

## 2015-08-05 DIAGNOSIS — J45909 Unspecified asthma, uncomplicated: Secondary | ICD-10-CM | POA: Insufficient documentation

## 2015-08-05 DIAGNOSIS — Z9889 Other specified postprocedural states: Secondary | ICD-10-CM | POA: Insufficient documentation

## 2015-08-05 DIAGNOSIS — I252 Old myocardial infarction: Secondary | ICD-10-CM | POA: Insufficient documentation

## 2015-08-05 DIAGNOSIS — Y929 Unspecified place or not applicable: Secondary | ICD-10-CM | POA: Diagnosis not present

## 2015-08-05 DIAGNOSIS — I10 Essential (primary) hypertension: Secondary | ICD-10-CM | POA: Insufficient documentation

## 2015-08-05 DIAGNOSIS — Z9861 Coronary angioplasty status: Secondary | ICD-10-CM | POA: Diagnosis not present

## 2015-08-05 DIAGNOSIS — Z7952 Long term (current) use of systemic steroids: Secondary | ICD-10-CM | POA: Insufficient documentation

## 2015-08-05 DIAGNOSIS — Z79899 Other long term (current) drug therapy: Secondary | ICD-10-CM | POA: Insufficient documentation

## 2015-08-05 DIAGNOSIS — X58XXXA Exposure to other specified factors, initial encounter: Secondary | ICD-10-CM | POA: Insufficient documentation

## 2015-08-05 DIAGNOSIS — Z87891 Personal history of nicotine dependence: Secondary | ICD-10-CM | POA: Insufficient documentation

## 2015-08-05 DIAGNOSIS — Z8639 Personal history of other endocrine, nutritional and metabolic disease: Secondary | ICD-10-CM | POA: Diagnosis not present

## 2015-08-05 DIAGNOSIS — Z951 Presence of aortocoronary bypass graft: Secondary | ICD-10-CM | POA: Insufficient documentation

## 2015-08-05 DIAGNOSIS — M25511 Pain in right shoulder: Secondary | ICD-10-CM

## 2015-08-05 DIAGNOSIS — K219 Gastro-esophageal reflux disease without esophagitis: Secondary | ICD-10-CM | POA: Insufficient documentation

## 2015-08-05 DIAGNOSIS — M1990 Unspecified osteoarthritis, unspecified site: Secondary | ICD-10-CM | POA: Diagnosis not present

## 2015-08-05 DIAGNOSIS — Z7982 Long term (current) use of aspirin: Secondary | ICD-10-CM | POA: Diagnosis not present

## 2015-08-05 DIAGNOSIS — Z791 Long term (current) use of non-steroidal anti-inflammatories (NSAID): Secondary | ICD-10-CM | POA: Diagnosis not present

## 2015-08-05 DIAGNOSIS — Y999 Unspecified external cause status: Secondary | ICD-10-CM | POA: Diagnosis not present

## 2015-08-05 DIAGNOSIS — Y939 Activity, unspecified: Secondary | ICD-10-CM | POA: Insufficient documentation

## 2015-08-05 DIAGNOSIS — S4991XA Unspecified injury of right shoulder and upper arm, initial encounter: Secondary | ICD-10-CM | POA: Insufficient documentation

## 2015-08-05 MED ORDER — NAPROXEN 250 MG PO TABS
500.0000 mg | ORAL_TABLET | Freq: Once | ORAL | Status: AC
  Administered 2015-08-05: 500 mg via ORAL
  Filled 2015-08-05: qty 2

## 2015-08-05 MED ORDER — NAPROXEN 500 MG PO TABS: 500.0000 mg | ORAL_TABLET | Freq: Two times a day (BID) | ORAL | Status: DC

## 2015-08-05 NOTE — Discharge Instructions (Signed)
You have been seen today for a referral to an orthopedic specialist.  You have been prescribed Naproxen, which is an anti-inflammatory medication that is preferred over other NSAIDS for patients with cardiac history.  Please contact the orthopedic contact as soon as possible.

## 2015-08-05 NOTE — ED Notes (Signed)
Pt here with surgical pin in right shoulder that was placed in 70's is broken. Is here to get a referral for ortho and something for pain.

## 2015-08-05 NOTE — ED Provider Notes (Signed)
CSN: [id number]     Arrival date & time 08/05/15  1226 History  By signing my name below, I, Essence Howell, attest that this documentation has been prepared under the direction and in the presence of Harolyn Rutherford, PA-C Electronically Signed: Charline Bills, ED Scribe 08/05/2015 at 1:02 PM.   Chief Complaint  Patient presents with  . Shoulder Pain   The history is provided by the patient. No language interpreter was used.   HPI Comments: Peter STEINBERG Sr. is a 64 y.o. male who presents to the Emergency Department complaining of gradually worsening, constant right shoulder pain for the past month. Pt currently reports 10/10, aching, stabbing non-radiating pain that is exacerbated with movement. Pt presents an XR from 4 years ago. He states that he was seen by [facility] a few months ago and given a cortisone injection with temporary relief. No other treatments tried PTA. He requests a referral to another orthopedist at this time. Pt's past surgical history includes bilateral shoulder arthroscopy.  Pt admits that this pain is chronic and requests something for pain that is not a narcotic. Pt states he has pins in both shoulders that were placed in the 70s. Pt states about a month ago, pt rolled over in bed and raised his arm, then felt a "pop" in the right shoulder. Pt states he knows that the pin in that shoulder is broken.  Pt denies any other complaints or pain.  Past Medical History  Diagnosis Date  . Coronary artery disease     DES proximal LAD  . GERD (gastroesophageal reflux disease)   . MI (mitral incompetence)     anterolateral MI October 2014  . HTN (hypertension)   . Anginal pain (HCC)   . Myocardial infarction (HCC)   . Shortness of breath   . Arthritis     OSTEO  . Gout   . Hyperlipemia   . Asthma    Past Surgical History  Procedure Laterality Date  . Coronary stent placement  2000, 2014  . Bilateral shoulder arthroscopy    . Coronary artery bypass graft N/A  05/01/2014    Procedure: CORONARY ARTERY BYPASS GRAFTING (CABG);  Surgeon: Alleen Borne, MD;  Location: [facility] OR;  Service: Open Heart Surgery;  Laterality: N/A;  Times 5 using left internal mammary artery and endoscopically harvested right saphenous vein  . Intraoperative transesophageal echocardiogram N/A 05/01/2014    Procedure: INTRAOPERATIVE TRANSESOPHAGEAL ECHOCARDIOGRAM;  Surgeon: Alleen Borne, MD;  Location[facility] OR;  Service: Open Heart Surgery;  Laterality: N/A;  . Left heart catheterization with coronary angiogram N/A 07/29/2013    Procedure: LEFT HEART CATHETERIZATION WITH CORONARY ANGIOGRAM;  Surgeon: Robynn Pane, MD;  Location: [facility] CATH LAB;  Service: Cardiovascular;  Laterality: N/A;  . Percutaneous stent intervention  07/29/2013    Procedure: PERCUTANEOUS STENT INTERVENTION;  Surgeon: Robynn Pane, MD;  Location: [facility] CATH LAB;  Service: Cardiovascular;;  . Left heart catheterization with coronary angiogram N/A 04/24/2014    Procedure: LEFT HEART CATHETERIZATION WITH CORONARY ANGIOGRAM;  Surgeon: Kathleene Hazel, MD;  Location: [facility] CATH LAB;  Service: Cardiovascular;  Laterality: N/A;   Family History  Problem Relation Age of Onset  . CAD Father   . Heart attack Brother 44    x 3  . Heart attack Father 70  . Cancer Sister   . Cancer Mother   . Stroke Mother    Social History  Substance Use Topics  . Smoking status: Former Smoker --  0.50 packs/day for 54 years    Types: Cigarettes  . Smokeless tobacco: Current User    Types: Chew  . Alcohol Use: No     Comment: former    Review of Systems  Respiratory: Negative for chest tightness and shortness of breath.   Cardiovascular: Negative for chest pain and palpitations.  Gastrointestinal: Negative for abdominal pain.  Musculoskeletal: Positive for arthralgias.       Right shoulder pain   Allergies  Codeine and Oxycodone  Home Medications   Prior to Admission medications   Medication Sig Start Date End Date  Taking? Authorizing Provider  albuterol (PROVENTIL) (2.5 MG/3ML) 0.083% nebulizer solution Take 3 mLs (2.5 mg total) by nebulization every 6 (six) hours as needed for wheezing or shortness of breath. 12/29/14   Lorre Nick, MD  ALPRAZolam Prudy Feeler) 0.5 MG tablet Take 1 tablet (0.5 mg total) by mouth at bedtime as needed for anxiety. 06/05/14   Doris Cheadle, MD  aspirin EC 325 MG EC tablet Take 1 tablet (325 mg total) by mouth daily. 05/06/14   Donielle Margaretann Loveless, PA-C  carvedilol (COREG) 3.125 MG tablet Take 1 tablet (3.125 mg total) by mouth 2 (two) times daily with a meal. 06/03/14   Kathleene Hazel, MD  esomeprazole (NEXIUM) 40 MG capsule Take 40 mg by mouth daily at 12 noon.    Historical Provider, MD  fluticasone (FLONASE) 50 MCG/ACT nasal spray Place 2 sprays into both nostrils daily. Patient taking differently: Place 2 sprays into both nostrils daily as needed for allergies.  01/06/15   Doris Cheadle, MD  furosemide (LASIX) 40 MG tablet Take 1 tablet (40 mg total) by mouth daily. 03/30/15   Tomasita Crumble, MD  ibuprofen (ADVIL,MOTRIN) 600 MG tablet Take 1 tablet (600 mg total) by mouth 3 (three) times daily after meals. 04/16/15   Loren Racer, MD  naproxen (NAPROSYN) 500 MG tablet Take 1 tablet (500 mg total) by mouth 2 (two) times daily. 08/05/15   Casilda Pickerill C Chaitanya Amedee, PA-C  nitroGLYCERIN (NITROSTAT) 0.4 MG SL tablet Place 0.4 mg under the tongue every 5 (five) minutes as needed for chest pain.    Historical Provider, MD  oxyCODONE-acetaminophen (PERCOCET/ROXICET) 5-325 MG per tablet Take 1 tablet by mouth every 8 (eight) hours as needed for moderate pain or severe pain. 05/29/14   Delight Ovens, MD  pantoprazole (PROTONIX) 40 MG tablet Take 40 mg by mouth daily.    Historical Provider, MD  predniSONE (DELTASONE) 20 MG tablet 3 tabs po day one, then 2 tabs daily x 4 days 04/16/15   Loren Racer, MD  spironolactone (ALDACTONE) 25 MG tablet Take 12.5 mg by mouth daily.    Historical Provider,  MD   BP 145/77 mmHg  Pulse 73  Temp(Src) 98.4 F (36.9 C) (Oral)  Resp 18  SpO2 97% Physical Exam  Constitutional: He appears well-developed and well-nourished. No distress.  HENT:  Head: Normocephalic and atraumatic.  Eyes: Conjunctivae are normal.  Cardiovascular: Normal rate and regular rhythm.   Pulmonary/Chest: Effort normal. No respiratory distress.  Musculoskeletal: Normal range of motion.  Full, but guarded ROM bilaterally.  Neurological: He is alert.  Strength 5/5 bilaterally. No numbness, tingling or other neurologic deficits.   Skin: Skin is warm and dry.  Psychiatric: He has a normal mood and affect. His behavior is normal.  Nursing note and vitals reviewed.  ED Course  Procedures (including critical care time) DIAGNOSTIC STUDIES: Oxygen Saturation is 97% on RA, normal by my interpretation.  COORDINATION OF CARE: 12:55 PM-Discussed treatment plan which includes referral with pt at bedside and pt agreed to plan.   Labs Review Labs Reviewed - No data to display  Imaging Review No results found. I have personally reviewed and evaluated these images and lab results as part of my medical decision-making.   EKG Interpretation None      MDM   Final diagnoses:  Right shoulder pain    YOMAR MEJORADO Sr. presents with chronic right shoulder pain.  Pt only requests a referral to orthopedics and something for pain that is not a narcotic. Spoke with Berlin Hun, pharmacist for guidance on NSAID choice with cardiac history. Revonda Standard advised Naproxen is the medication of choice in this instance. Pt given Naproxen here in the ED, a prescription for naproxen, and a referral to the orthopedic practice on call.   I personally performed the services described in this documentation, which was scribed in my presence. The recorded information has been reviewed and is accurate.    Anselm Pancoast, PA-C 08/05/15 1318  Anselm Pancoast, PA-C 08/05/15 1325  Gilda Crease, MD 08/06/15 [phone]

## 2015-08-13 ENCOUNTER — Encounter: Payer: Self-pay | Admitting: Internal Medicine

## 2015-08-13 ENCOUNTER — Ambulatory Visit: Payer: Medicaid Other | Attending: Family Medicine | Admitting: Internal Medicine

## 2015-08-13 VITALS — BP 122/74 | HR 74 | Temp 98.0°F | Resp 16 | Ht 68.0 in | Wt 218.0 lb

## 2015-08-13 DIAGNOSIS — G4700 Insomnia, unspecified: Secondary | ICD-10-CM

## 2015-08-13 DIAGNOSIS — M25511 Pain in right shoulder: Secondary | ICD-10-CM

## 2015-08-13 MED ORDER — TRAZODONE HCL 50 MG PO TABS: 25.0000 mg | ORAL_TABLET | Freq: Every evening | ORAL | Status: DC | PRN

## 2015-08-13 NOTE — Progress Notes (Signed)
Patient here for a referral Patient needs to have a screw to his right shoulder replaced Requesting a referral to [facility]

## 2015-08-13 NOTE — Progress Notes (Signed)
Patient ID: Peter ROBARTS Sr., male   DOB: 1951-05-09, 64 y.o.   MRN: [id number]  CC: referral  HPI: Peter Booker is a 64 y.o. male here today for a follow up visit.  Patient has past medical history of hypertension, CAD, status post CABG in the past. Today patient reports that since his CABG 2 years he has had problems with the screw in his right shoulder. He presents with a xray that shows a loose screw and he is requesting a referral to Dr. Ophelia Charter. He states that he seen Dr. Ophelia Charter 2 months ago and would like him to do his surgery. He is currently being managed by [facility] for chronic arthritis. He has chronic bronchitis, he is former smoker (quit date 3 years again).   Patient reports that he only sleeps 2 hours per night. Would like to have refill of Trazodone.   Allergies  Allergen Reactions  . Codeine Itching  . Oxycodone Itching   Past Medical History  Diagnosis Date  . Coronary artery disease     DES proximal LAD  . GERD (gastroesophageal reflux disease)   . MI (mitral incompetence)     anterolateral MI October 2014  . HTN (hypertension)   . Anginal pain (HCC)   . Myocardial infarction (HCC)   . Shortness of breath   . Arthritis     OSTEO  . Gout   . Hyperlipemia   . Asthma    Current Outpatient Prescriptions on File Prior to Visit  Medication Sig Dispense Refill  . albuterol (PROVENTIL) (2.5 MG/3ML) 0.083% nebulizer solution Take 3 mLs (2.5 mg total) by nebulization every 6 (six) hours as needed for wheezing or shortness of breath. 75 mL 2  . ALPRAZolam (XANAX) 0.5 MG tablet Take 1 tablet (0.5 mg total) by mouth at bedtime as needed for anxiety. 20 tablet 0  . aspirin EC 325 MG EC tablet Take 1 tablet (325 mg total) by mouth daily. 30 tablet 0  . carvedilol (COREG) 3.125 MG tablet Take 1 tablet (3.125 mg total) by mouth 2 (two) times daily with a meal. 60 tablet 1  . esomeprazole (NEXIUM) 40 MG capsule Take 40 mg by mouth daily at 12 noon.    . furosemide (LASIX) 40 MG  tablet Take 1 tablet (40 mg total) by mouth daily. 5 tablet 0  . ibuprofen (ADVIL,MOTRIN) 600 MG tablet Take 1 tablet (600 mg total) by mouth 3 (three) times daily after meals. 30 tablet 0  . naproxen (NAPROSYN) 500 MG tablet Take 1 tablet (500 mg total) by mouth 2 (two) times daily. 30 tablet 0  . nitroGLYCERIN (NITROSTAT) 0.4 MG SL tablet Place 0.4 mg under the tongue every 5 (five) minutes as needed for chest pain.    . pantoprazole (PROTONIX) 40 MG tablet Take 40 mg by mouth daily.    Marland Kitchen spironolactone (ALDACTONE) 25 MG tablet Take 12.5 mg by mouth daily.    . fluticasone (FLONASE) 50 MCG/ACT nasal spray Place 2 sprays into both nostrils daily. (Patient taking differently: Place 2 sprays into both nostrils daily as needed for allergies. ) 16 g 3  . oxyCODONE-acetaminophen (PERCOCET/ROXICET) 5-325 MG per tablet Take 1 tablet by mouth every 8 (eight) hours as needed for moderate pain or severe pain. 40 tablet 0  . predniSONE (DELTASONE) 20 MG tablet 3 tabs po day one, then 2 tabs daily x 4 days 11 tablet 0  . [DISCONTINUED] ramipril (ALTACE) 1.25 MG capsule Take 1 capsule (1.25 mg total) by  mouth daily. (Patient not taking: Reported on 12/29/2014) 30 capsule 11  . [DISCONTINUED] traZODone (DESYREL) 50 MG tablet Take 0.5-1 tablets (25-50 mg total) by mouth at bedtime as needed for sleep. (Patient not taking: Reported on 09/14/2014) 30 tablet 3   No current facility-administered medications on file prior to visit.   Family History  Problem Relation Age of Onset  . CAD Father   . Heart attack Brother 44    x 3  . Heart attack Father 10  . Cancer Sister   . Cancer Mother   . Stroke Mother    Social History   Social History  . Marital Status: Divorced    Spouse Name: N/A  . Number of Children: 3  . Years of Education: N/A   Occupational History  . Disabled    Social History Main Topics  . Smoking status: Former Smoker -- 0.50 packs/day for 54 years    Types: Cigarettes  . Smokeless  tobacco: Current User    Types: Chew  . Alcohol Use: No     Comment: former  . Drug Use: No  . Sexual Activity: Not on file   Other Topics Concern  . Not on file   Social History Narrative    Review of Systems: Other than what is stated in HPI, all other systems are negative. Objective:   Filed Vitals:   08/13/15 1714  BP: 122/74  Pulse: 74  Temp: 98 F (36.7 C)  Resp: 16    Physical Exam  Constitutional: He is oriented to person, place, and time.  Cardiovascular: Normal rate, regular rhythm and normal heart sounds.   Pulmonary/Chest: Effort normal and breath sounds normal.  Musculoskeletal: He exhibits tenderness (right shoulder with Limited above head ROM). He exhibits no edema.  Neurological: He is alert and oriented to person, place, and time.     Lab Results  Component Value Date   WBC 7.1 03/29/2015   HGB 10.0* 03/29/2015   HCT 34.2* 03/29/2015   MCV 74.7* 03/29/2015   PLT 231 03/29/2015   Lab Results  Component Value Date   CREATININE 0.97 03/29/2015   BUN 9 03/29/2015   NA 139 03/29/2015   K 3.6 03/29/2015   CL 109 03/29/2015   CO2 22 03/29/2015    Lab Results  Component Value Date   HGBA1C * 12/31/2010    6.1 (NOTE)                                                                       According to the ADA Clinical Practice Recommendations for 2011, when HbA1c is used as a screening test:   >=6.5%   Diagnostic of Diabetes Mellitus           (if abnormal result  is confirmed)  5.7-6.4%   Increased risk of developing Diabetes Mellitus  References:Diagnosis and Classification of Diabetes Mellitus,Diabetes Care,2011,34(Suppl 1):S62-S69 and Standards of Medical Care in         Diabetes - 2011,Diabetes Care,2011,34  (Suppl 1):S11-S61.   Lipid Panel     Component Value Date/Time   CHOL 170 04/29/2014 0500   TRIG 104 04/29/2014 0500   HDL 60 04/29/2014 0500   CHOLHDL 2.8 04/29/2014 0500   VLDL 21 04/29/2014  0500   LDLCALC 89 04/29/2014 0500        Assessment and plan:   Peter Booker was seen today for referral.  Diagnoses and all orders for this visit:  Right shoulder pain -     Ambulatory referral to Orthopedic Surgery  Insomnia -    Refill traZODone (DESYREL) 50 MG tablet; Take 0.5-1 tablets (25-50 mg total) by mouth at bedtime as needed for sleep. Sleep hygiene discussed with patient   Return if symptoms worsen or fail to improve.       Ambrose Finland, NP-C [facility] [phone] 08/13/2015, 5:31 PM

## 2015-08-31 ENCOUNTER — Emergency Department (HOSPITAL_COMMUNITY)
Admission: EM | Admit: 2015-08-31 | Discharge: 2015-08-31 | Disposition: A | Discharge status: Home / Self Care | Payer: Medicaid Other | Attending: Emergency Medicine | Admitting: Emergency Medicine

## 2015-08-31 ENCOUNTER — Encounter (HOSPITAL_COMMUNITY): Payer: Self-pay | Admitting: Family Medicine

## 2015-08-31 ENCOUNTER — Telehealth: Payer: Self-pay | Admitting: Internal Medicine

## 2015-08-31 DIAGNOSIS — Z9861 Coronary angioplasty status: Secondary | ICD-10-CM | POA: Diagnosis not present

## 2015-08-31 DIAGNOSIS — I252 Old myocardial infarction: Secondary | ICD-10-CM | POA: Diagnosis not present

## 2015-08-31 DIAGNOSIS — Z79899 Other long term (current) drug therapy: Secondary | ICD-10-CM | POA: Diagnosis not present

## 2015-08-31 DIAGNOSIS — K219 Gastro-esophageal reflux disease without esophagitis: Secondary | ICD-10-CM | POA: Insufficient documentation

## 2015-08-31 DIAGNOSIS — Z87891 Personal history of nicotine dependence: Secondary | ICD-10-CM | POA: Diagnosis not present

## 2015-08-31 DIAGNOSIS — I10 Essential (primary) hypertension: Secondary | ICD-10-CM | POA: Diagnosis not present

## 2015-08-31 DIAGNOSIS — Z791 Long term (current) use of non-steroidal anti-inflammatories (NSAID): Secondary | ICD-10-CM | POA: Insufficient documentation

## 2015-08-31 DIAGNOSIS — I25119 Atherosclerotic heart disease of native coronary artery with unspecified angina pectoris: Secondary | ICD-10-CM | POA: Diagnosis not present

## 2015-08-31 DIAGNOSIS — M1990 Unspecified osteoarthritis, unspecified site: Secondary | ICD-10-CM | POA: Insufficient documentation

## 2015-08-31 DIAGNOSIS — E785 Hyperlipidemia, unspecified: Secondary | ICD-10-CM | POA: Insufficient documentation

## 2015-08-31 DIAGNOSIS — M25511 Pain in right shoulder: Secondary | ICD-10-CM | POA: Diagnosis present

## 2015-08-31 DIAGNOSIS — J45909 Unspecified asthma, uncomplicated: Secondary | ICD-10-CM | POA: Insufficient documentation

## 2015-08-31 DIAGNOSIS — Z7982 Long term (current) use of aspirin: Secondary | ICD-10-CM | POA: Insufficient documentation

## 2015-08-31 MED ORDER — NAPROXEN 500 MG PO TABS
500.0000 mg | ORAL_TABLET | Freq: Two times a day (BID) | ORAL | Status: DC
Start: 2015-08-31 — End: 2015-12-03

## 2015-08-31 NOTE — ED Provider Notes (Signed)
CSN: [id number]     Arrival date & time 08/31/15  1241 History  By signing my name below, I, Marica Otter, attest that this documentation has been prepared under the direction and in the presence of Melburn Hake, [place]. Electronically Signed: Marica Otter, ED Scribe. 08/31/2015. 1:54 PM.  Chief Complaint  Patient presents with  . Shoulder Pain   The history is provided by the patient. No language interpreter was used.   PCP: Ambrose Finland, NP HPI Comments: Peter SALSGIVER Sr. is a 64 y.o. male, with PMHx noted below, who presents to the Emergency Department for refill of his Naproxen . Pt reports he takes the naproxen for his chronic right shoulder pain. He states he had right shoulder surgery done in the 70s and states that one of the screws fell out of the plate a few years ago resulting in him having chronic shoulder pain. He notes the pain has gotten worse over the past few months. Pt states he has tried BCs in the past for his Sx, but, denies trying any other meds for pain management. Pt also requests a sling for his right arm. Pt reports he is an upcoming appointment with ortho next week for his shoulder pain. Pt denies numbness/tingling in BUE, weakness of BUE, radiating pain from his right shoulder.   Past Medical History  Diagnosis Date  . Coronary artery disease     DES proximal LAD  . GERD (gastroesophageal reflux disease)   . MI (mitral incompetence)     anterolateral MI October 2014  . HTN (hypertension)   . Anginal pain (HCC)   . Myocardial infarction (HCC)   . Shortness of breath   . Arthritis     OSTEO  . Gout   . Hyperlipemia   . Asthma    Past Surgical History  Procedure Laterality Date  . Coronary stent placement  2000, 2014  . Bilateral shoulder arthroscopy    . Coronary artery bypass graft N/A 05/01/2014    Procedure: CORONARY ARTERY BYPASS GRAFTING (CABG);  Surgeon: Alleen Borne, MD;  Location: [facility] OR;  Service: Open Heart Surgery;  Laterality: N/A;   Times 5 using left internal mammary artery and endoscopically harvested right saphenous vein  . Intraoperative transesophageal echocardiogram N/A 05/01/2014    Procedure: INTRAOPERATIVE TRANSESOPHAGEAL ECHOCARDIOGRAM;  Surgeon: Alleen Borne, MD;  Location: [facility] OR;  Service: Open Heart Surgery;  Laterality: N/A;  . Left heart catheterization with coronary angiogram N/A 07/29/2013    Procedure: LEFT HEART CATHETERIZATION WITH CORONARY ANGIOGRAM;  Surgeon: Robynn Pane, MD;  Location: [facility] CATH LAB;  Service: Cardiovascular;  Laterality: N/A;  . Percutaneous stent intervention  07/29/2013    Procedure: PERCUTANEOUS STENT INTERVENTION;  Surgeon: Robynn Pane, MD;  Location: [facility] CATH LAB;  Service: Cardiovascular;;  . Left heart catheterization with coronary angiogram N/A 04/24/2014    Procedure: LEFT HEART CATHETERIZATION WITH CORONARY ANGIOGRAM;  Surgeon: Kathleene Hazel, MD;  Location: [facility] CATH LAB;  Service: Cardiovascular;  Laterality: N/A;   Family History  Problem Relation Age of Onset  . CAD Father   . Heart attack Brother 44    x 3  . Heart attack Father 51  . Cancer Sister   . Cancer Mother   . Stroke Mother    Social History  Substance Use Topics  . Smoking status: Former Smoker -- 0.50 packs/day for 54 years    Types: Cigarettes  . Smokeless tobacco: Current User    Types: Chew  .  Alcohol Use: No     Comment: former    Review of Systems  Constitutional: Negative for fever.  Musculoskeletal: Positive for arthralgias (right shoulder).  Neurological: Negative for weakness and numbness.   Allergies  Codeine and Oxycodone  Home Medications   Prior to Admission medications   Medication Sig Start Date End Date Taking? Authorizing Provider  albuterol (PROVENTIL) (2.5 MG/3ML) 0.083% nebulizer solution Take 3 mLs (2.5 mg total) by nebulization every 6 (six) hours as needed for wheezing or shortness of breath. 12/29/14   Lorre Nick, MD  ALPRAZolam Prudy Feeler) 0.5 MG  tablet Take 1 tablet (0.5 mg total) by mouth at bedtime as needed for anxiety. 06/05/14   Doris Cheadle, MD  aspirin EC 325 MG EC tablet Take 1 tablet (325 mg total) by mouth daily. 05/06/14   Donielle Margaretann Loveless, PA-C  carvedilol (COREG) 3.125 MG tablet Take 1 tablet (3.125 mg total) by mouth 2 (two) times daily with a meal. 06/03/14   Kathleene Hazel, MD  esomeprazole (NEXIUM) 40 MG capsule Take 40 mg by mouth daily at 12 noon.    Historical Provider, MD  fluticasone (FLONASE) 50 MCG/ACT nasal spray Place 2 sprays into both nostrils daily. Patient taking differently: Place 2 sprays into both nostrils daily as needed for allergies.  01/06/15   Doris Cheadle, MD  furosemide (LASIX) 40 MG tablet Take 1 tablet (40 mg total) by mouth daily. 03/30/15   Tomasita Crumble, MD  ibuprofen (ADVIL,MOTRIN) 600 MG tablet Take 1 tablet (600 mg total) by mouth 3 (three) times daily after meals. 04/16/15   Loren Racer, MD  naproxen (NAPROSYN) 500 MG tablet Take 1 tablet (500 mg total) by mouth 2 (two) times daily. 08/31/15   Barrett Henle, PA-C  nitroGLYCERIN (NITROSTAT) 0.4 MG SL tablet Place 0.4 mg under the tongue every 5 (five) minutes as needed for chest pain.    Historical Provider, MD  oxyCODONE-acetaminophen (PERCOCET/ROXICET) 5-325 MG per tablet Take 1 tablet by mouth every 8 (eight) hours as needed for moderate pain or severe pain. 05/29/14   Delight Ovens, MD  pantoprazole (PROTONIX) 40 MG tablet Take 40 mg by mouth daily.    Historical Provider, MD  predniSONE (DELTASONE) 20 MG tablet 3 tabs po day one, then 2 tabs daily x 4 days 04/16/15   Loren Racer, MD  spironolactone (ALDACTONE) 25 MG tablet Take 12.5 mg by mouth daily.    Historical Provider, MD  traZODone (DESYREL) 50 MG tablet Take 0.5-1 tablets (25-50 mg total) by mouth at bedtime as needed for sleep. 08/13/15   Ambrose Finland, NP   Triage Vitals: BP 126/71 mmHg  Pulse 65  Temp(Src) 98.6 F (37 C)  Resp 18  SpO2  98% Physical Exam  Constitutional: He is oriented to person, place, and time. He appears well-developed and well-nourished. No distress.  HENT:  Head: Normocephalic and atraumatic.  Eyes: Conjunctivae and EOM are normal. Right eye exhibits no discharge. Left eye exhibits no discharge. No scleral icterus.  Neck: Normal range of motion. Neck supple.  Cardiovascular: Normal rate.   Pulmonary/Chest: Effort normal.  Abdominal: He exhibits no distension.  Musculoskeletal: He exhibits no edema or tenderness.       Right shoulder: He exhibits decreased range of motion (due to pain). He exhibits no tenderness, no bony tenderness, no swelling, no effusion, no crepitus, no deformity, no laceration, no spasm, normal pulse and normal strength.  2+ radial pulses.  Sensation intact. 5/5 strength.  Full passive  ROM of left shoulder.  Neurological: He is alert and oriented to person, place, and time.  Psychiatric: He has a normal mood and affect.  Nursing note and vitals reviewed.   ED Course  Procedures (including critical care time) DIAGNOSTIC STUDIES: Oxygen Saturation is 98% on ra, nl by my interpretation.    COORDINATION OF CARE: 1:49 PM: Discussed treatment plan which includes Rx for Naproxen and sling with pt at bedside; patient verbalizes understanding and agrees with treatment plan.  MDM   Final diagnoses:  Right shoulder pain    Patient presents for refill of his naproxen for his chronic right shoulder pain. Reports history of having shoulder surgery in the 70s and notes one of his screws fell out a few years ago resulting in him having chronic right shoulder pain. He notes he has an appointment scheduled with an orthopedist next week and is waiting to have his MRI approved by Medicaid. VSS. Exam revealed decreased active range of motion of right shoulder due to pain, right arm otherwise neurovascularly intact. Plan to discharge patient home with naproxen. Patient given arm sling in the  ED. Advised patient to follow up with his orthopedist at his scheduled appointment.  Evaluation does not show pathology requring ongoing emergent intervention or admission. Pt is hemodynamically stable and mentating appropriately. Discussed findings/results and plan with patient/guardian, who agrees with plan. All questions answered. Return precautions discussed and outpatient follow up given.    I personally performed the services described in this documentation, which was scribed in my presence. The recorded information has been reviewed and is accurate.    Satira Sark [place], [place] 08/31/15 1358  Geoffery Lyons, MD 08/31/15 1416

## 2015-08-31 NOTE — Telephone Encounter (Signed)
Patient called requesting a medication refill for naproxen (NAPROSYN)  Patient states that he's experiencing a lot of pain Please follow up with patient Patient uses [facility] [place] - [place],  - [id number]

## 2015-08-31 NOTE — ED Notes (Signed)
Pt here for refill on naproxen for shoulder pain.

## 2015-08-31 NOTE — Discharge Instructions (Signed)
Take your medications as prescribed as needed for pain. Continue using the arm sling as needed for comfort. Follow-up with your Orthopedist at your scheduled appointment next week. Return to the emergency department if symptoms worsen or new onset of numbness, tingling, weakness.

## 2015-09-16 ENCOUNTER — Other Ambulatory Visit: Payer: Self-pay | Admitting: Internal Medicine

## 2015-10-13 ENCOUNTER — Other Ambulatory Visit: Payer: Self-pay | Admitting: Orthopaedic Surgery

## 2015-10-13 DIAGNOSIS — M542 Cervicalgia: Secondary | ICD-10-CM

## 2015-10-27 ENCOUNTER — Ambulatory Visit
Admission: RE | Admit: 2015-10-27 | Discharge: 2015-10-27 | Disposition: A | Discharge status: Home / Self Care | Payer: Medicaid Other | Source: Ambulatory Visit | Attending: Orthopaedic Surgery | Admitting: Orthopaedic Surgery

## 2015-10-27 DIAGNOSIS — M542 Cervicalgia: Secondary | ICD-10-CM

## 2015-11-25 ENCOUNTER — Other Ambulatory Visit: Payer: Self-pay | Admitting: Cardiology

## 2015-11-25 DIAGNOSIS — R079 Chest pain, unspecified: Secondary | ICD-10-CM

## 2015-12-03 ENCOUNTER — Encounter: Payer: Self-pay | Admitting: Internal Medicine

## 2015-12-03 ENCOUNTER — Ambulatory Visit: Payer: Medicaid Other | Attending: Internal Medicine | Admitting: Internal Medicine

## 2015-12-03 VITALS — BP 126/79 | HR 70 | Temp 98.0°F | Resp 16 | Ht 68.0 in | Wt 216.0 lb

## 2015-12-03 DIAGNOSIS — Z888 Allergy status to other drugs, medicaments and biological substances status: Secondary | ICD-10-CM | POA: Diagnosis not present

## 2015-12-03 DIAGNOSIS — R0981 Nasal congestion: Secondary | ICD-10-CM | POA: Insufficient documentation

## 2015-12-03 DIAGNOSIS — I10 Essential (primary) hypertension: Secondary | ICD-10-CM | POA: Diagnosis not present

## 2015-12-03 DIAGNOSIS — R05 Cough: Secondary | ICD-10-CM | POA: Diagnosis not present

## 2015-12-03 DIAGNOSIS — Z87891 Personal history of nicotine dependence: Secondary | ICD-10-CM | POA: Diagnosis not present

## 2015-12-03 DIAGNOSIS — R252 Cramp and spasm: Secondary | ICD-10-CM | POA: Insufficient documentation

## 2015-12-03 DIAGNOSIS — G4700 Insomnia, unspecified: Secondary | ICD-10-CM | POA: Diagnosis not present

## 2015-12-03 DIAGNOSIS — M109 Gout, unspecified: Secondary | ICD-10-CM | POA: Diagnosis not present

## 2015-12-03 DIAGNOSIS — Z7982 Long term (current) use of aspirin: Secondary | ICD-10-CM | POA: Insufficient documentation

## 2015-12-03 DIAGNOSIS — Z8249 Family history of ischemic heart disease and other diseases of the circulatory system: Secondary | ICD-10-CM | POA: Diagnosis not present

## 2015-12-03 DIAGNOSIS — I252 Old myocardial infarction: Secondary | ICD-10-CM | POA: Insufficient documentation

## 2015-12-03 DIAGNOSIS — J45909 Unspecified asthma, uncomplicated: Secondary | ICD-10-CM | POA: Insufficient documentation

## 2015-12-03 DIAGNOSIS — I2510 Atherosclerotic heart disease of native coronary artery without angina pectoris: Secondary | ICD-10-CM | POA: Diagnosis not present

## 2015-12-03 DIAGNOSIS — K219 Gastro-esophageal reflux disease without esophagitis: Secondary | ICD-10-CM | POA: Insufficient documentation

## 2015-12-03 DIAGNOSIS — M1990 Unspecified osteoarthritis, unspecified site: Secondary | ICD-10-CM | POA: Insufficient documentation

## 2015-12-03 DIAGNOSIS — Z79899 Other long term (current) drug therapy: Secondary | ICD-10-CM | POA: Insufficient documentation

## 2015-12-03 DIAGNOSIS — E785 Hyperlipidemia, unspecified: Secondary | ICD-10-CM | POA: Insufficient documentation

## 2015-12-03 LAB — COMPLETE METABOLIC PANEL WITH GFR
ALT: 14 U/L (ref 9–46)
AST: 19 U/L (ref 10–35)
Albumin: 3.9 g/dL (ref 3.6–5.1)
Alkaline Phosphatase: 75 U/L (ref 40–115)
BUN: 12 mg/dL (ref 7–25)
CALCIUM: 9.2 mg/dL (ref 8.6–10.3)
CHLORIDE: 101 mmol/L (ref 98–110)
CO2: 26 mmol/L (ref 20–31)
CREATININE: 0.99 mg/dL (ref 0.70–1.25)
GFR, Est African American: >89 mL/min (ref >=60)
GFR, Est Non African American: 81 mL/min (ref >=60)
GLUCOSE: 101 mg/dL — AB (ref 65–99)
Potassium: 4.5 mmol/L (ref 3.5–5.3)
SODIUM: 140 mmol/L (ref 135–146)
Total Bilirubin: 0.5 mg/dL (ref 0.2–1.2)
Total Protein: 7.1 g/dL (ref 6.1–8.1)

## 2015-12-03 LAB — LIPID PANEL
CHOLESTEROL: 229 mg/dL — AB (ref 125–200)
HDL: 41 mg/dL (ref >=40)
LDL Cholesterol: 165 mg/dL — ABNORMAL HIGH (ref <130)
Total CHOL/HDL Ratio: 5.6 Ratio — ABNORMAL HIGH (ref <=5.0)
Triglycerides: 116 mg/dL (ref <150)
VLDL: 23 mg/dL (ref <30)

## 2015-12-03 MED ORDER — FLUTICASONE PROPIONATE 50 MCG/ACT NA SUSP: 2.0000 | Freq: Every day | NASAL | Status: DC

## 2015-12-03 MED ORDER — TRAZODONE HCL 50 MG PO TABS: 25.0000 mg | ORAL_TABLET | Freq: Every evening | ORAL | Status: DC | PRN

## 2015-12-03 MED ORDER — TRAZODONE HCL 50 MG PO TABS
25.0000 mg | ORAL_TABLET | Freq: Every evening | ORAL | Status: DC | PRN
Start: 2015-12-03 — End: 2015-12-03

## 2015-12-03 MED ORDER — ALBUTEROL SULFATE (2.5 MG/3ML) 0.083% IN NEBU: 2.5000 mg | INHALATION_SOLUTION | Freq: Four times a day (QID) | RESPIRATORY_TRACT | Status: DC | PRN

## 2015-12-03 NOTE — Progress Notes (Signed)
Patient complains of having a lot of congestion with thick green mucous Patient states his symptoms have been going on for three weeks and is Requesting an antibiotic Also complaining of cramping in his legs Patient also in need of medication refills

## 2015-12-03 NOTE — Progress Notes (Signed)
Patient ID: Peter SCHMID Sr., male   DOB: Mar 22, 1951, 65 y.o.   MRN: [id number]  CC: nasal congestion  HPI: Tkai Serfass is a 65 y.o. male here today for a follow up visit.  Patient has past medical history of CAD, GERD, HTN, tobacco abuse, anxiety. Patient reports cough, rhinitis with green discharge, and nasal congestion that has been present for the past two weeks. Patient denies fever, chills, nausea, or vomiting. He has not tried anything for symptom management.  Patient complains of bilateral leg cramps at night time and when he begins to walk. Patient states that while walking he has to start off very slow to help with leg discomfort. He denies chest pain, SOB, or calf pain. He states that he recently followed up with his Cardiologist Dr. Sharyn Lull but he does not know which medications he is on. Patient is unsure if he is still on lasix or Aldactone.Patient is requesting a refill of his Xanax because he was taking 3 at bedtime to help him sleep while he was out of Trazodone. He reports that his cardiologist is not willing to refill his prescription due to taking more than prescribed.  Allergies  Allergen Reactions  . Codeine Itching  . Oxycodone Itching   Past Medical History  Diagnosis Date  . Coronary artery disease     DES proximal LAD  . GERD (gastroesophageal reflux disease)   . MI (mitral incompetence)     anterolateral MI October 2014  . HTN (hypertension)   . Anginal pain (HCC)   . Myocardial infarction (HCC)   . Shortness of breath   . Arthritis     OSTEO  . Gout   . Hyperlipemia   . Asthma    Current Outpatient Prescriptions on File Prior to Visit  Medication Sig Dispense Refill  . albuterol (PROVENTIL) (2.5 MG/3ML) 0.083% nebulizer solution Take 3 mLs (2.5 mg total) by nebulization every 6 (six) hours as needed for wheezing or shortness of breath. 75 mL 2  . ALPRAZolam (XANAX) 0.5 MG tablet Take 1 tablet (0.5 mg total) by mouth at bedtime as needed for  anxiety. 20 tablet 0  . aspirin EC 325 MG EC tablet Take 1 tablet (325 mg total) by mouth daily. 30 tablet 0  . carvedilol (COREG) 3.125 MG tablet Take 1 tablet (3.125 mg total) by mouth 2 (two) times daily with a meal. 60 tablet 1  . esomeprazole (NEXIUM) 40 MG capsule Take 40 mg by mouth daily at 12 noon.    . fluticasone (FLONASE) 50 MCG/ACT nasal spray Place 2 sprays into both nostrils daily. (Patient taking differently: Place 2 sprays into both nostrils daily as needed for allergies. ) 16 g 3  . ibuprofen (ADVIL,MOTRIN) 600 MG tablet Take 1 tablet (600 mg total) by mouth 3 (three) times daily after meals. 30 tablet 0  . naproxen (NAPROSYN) 500 MG tablet Take 1 tablet (500 mg total) by mouth 2 (two) times daily. 60 tablet 0  . nitroGLYCERIN (NITROSTAT) 0.4 MG SL tablet Place 0.4 mg under the tongue every 5 (five) minutes as needed for chest pain.    Marland Kitchen spironolactone (ALDACTONE) 25 MG tablet Take 12.5 mg by mouth daily.    . traZODone (DESYREL) 50 MG tablet Take 0.5-1 tablets (25-50 mg total) by mouth at bedtime as needed for sleep. 30 tablet 1  . furosemide (LASIX) 40 MG tablet Take 1 tablet (40 mg total) by mouth daily. (Patient not taking: Reported on 12/03/2015) 5 tablet 0  .  oxyCODONE-acetaminophen (PERCOCET/ROXICET) 5-325 MG per tablet Take 1 tablet by mouth every 8 (eight) hours as needed for moderate pain or severe pain. 40 tablet 0  . pantoprazole (PROTONIX) 40 MG tablet Take 40 mg by mouth daily.    . predniSONE (DELTASONE) 20 MG tablet 3 tabs po day one, then 2 tabs daily x 4 days (Patient not taking: Reported on 12/03/2015) 11 tablet 0  . [DISCONTINUED] ramipril (ALTACE) 1.25 MG capsule Take 1 capsule (1.25 mg total) by mouth daily. (Patient not taking: Reported on 12/29/2014) 30 capsule 11   No current facility-administered medications on file prior to visit.   Family History  Problem Relation Age of Onset  . CAD Father   . Heart attack Brother 44    x 3  . Heart attack Father 65   . Cancer Sister   . Cancer Mother   . Stroke Mother    Social History   Social History  . Marital Status: Divorced    Spouse Name: N/A  . Number of Children: 3  . Years of Education: N/A   Occupational History  . Disabled    Social History Main Topics  . Smoking status: Former Smoker -- 0.50 packs/day for 54 years    Types: Cigarettes  . Smokeless tobacco: Current User    Types: Chew  . Alcohol Use: No     Comment: former  . Drug Use: No  . Sexual Activity: Not on file   Other Topics Concern  . Not on file   Social History Narrative    Review of Systems: Other than what is stated in HPI, all other systems are negative.   Objective:   Filed Vitals:   12/03/15 0924  BP: 126/79  Pulse: 70  Temp: 98 F (36.7 C)  Resp: 16    Physical Exam  Constitutional: He is oriented to person, place, and time.  HENT:  Right Ear: External ear normal.  Left Ear: External ear normal.  Mouth/Throat: Oropharynx is clear and moist.  Eyes: EOM are normal. Pupils are equal, round, and reactive to light. Right eye exhibits no discharge. Left eye exhibits no discharge.  Cardiovascular: Normal rate, regular rhythm and normal heart sounds.   Pulmonary/Chest: Effort normal and breath sounds normal.  Musculoskeletal: He exhibits no edema.  No calf tenderness, warmth, or asymmetry  Negative Homan's sign  Neurological: He is alert and oriented to person, place, and time.  Skin: Skin is warm and dry.  Psychiatric:  Patient agitated      Lab Results  Component Value Date   WBC 7.1 03/29/2015   HGB 10.0* 03/29/2015   HCT 34.2* 03/29/2015   MCV 74.7* 03/29/2015   PLT 231 03/29/2015   Lab Results  Component Value Date   CREATININE 0.97 03/29/2015   BUN 9 03/29/2015   NA 139 03/29/2015   K 3.6 03/29/2015   CL 109 03/29/2015   CO2 22 03/29/2015    Lab Results  Component Value Date   HGBA1C * 12/31/2010    6.1 (NOTE)                                                                        According to the ADA Clinical Practice Recommendations for  2011, when HbA1c is used as a screening test:   >=6.5%   Diagnostic of Diabetes Mellitus           (if abnormal result  is confirmed)  5.7-6.4%   Increased risk of developing Diabetes Mellitus  References:Diagnosis and Classification of Diabetes Mellitus,Diabetes Care,2011,34(Suppl 1):S62-S69 and Standards of Medical Care in         Diabetes - 2011,Diabetes Care,2011,34  (Suppl 1):S11-S61.   Lipid Panel     Component Value Date/Time   CHOL 170 04/29/2014 0500   TRIG 104 04/29/2014 0500   HDL 60 04/29/2014 0500   CHOLHDL 2.8 04/29/2014 0500   VLDL 21 04/29/2014 0500   LDLCALC 89 04/29/2014 0500       Assessment and plan:   Peter Booker was seen today for nasal congestion.  Diagnoses and all orders for this visit:  Atherosclerosis of native coronary artery of native heart without angina pectoris -     Lipid panel Patient is unsure if he is on a statin by his Cardiologist. I will have patient to sign release of records so that I may get a updated medication list and office notes.   Bilateral leg cramps -     ABI; Future -     COMPLETE METABOLIC PANEL WITH GFR Unsure if cramps are claudication or due to a abnormal potassium level. I will check labs and complete a ABI due to his history of MI and CAD.   Insomnia -     traZODone (DESYREL) 50 MG tablet; Take 0.5-1 tablets (25-50 mg total) by mouth at bedtime as needed for sleep. Explained that I will not refill his Xanax either but I will refill his Trazodone which will help him sleep. Patient upset about decision and became loud stating that he is not going to get addicted to Xanax.   Nasal congestion -     Refill fluticasone (FLONASE) 50 MCG/ACT nasal spray; Place 2 sprays into both nostrils daily.   Return in about 6 months (around 06/01/2016).       Ambrose Finland, NP-C [facility] [phone] 12/03/2015, 9:54 AM

## 2015-12-04 ENCOUNTER — Encounter (HOSPITAL_COMMUNITY)
Admission: RE | Admit: 2015-12-04 | Discharge: 2015-12-04 | Disposition: A | Discharge status: Home / Self Care | Payer: Medicaid Other | Source: Ambulatory Visit | Attending: Cardiology | Admitting: Cardiology

## 2015-12-04 DIAGNOSIS — I5189 Other ill-defined heart diseases: Secondary | ICD-10-CM | POA: Diagnosis not present

## 2015-12-04 DIAGNOSIS — R079 Chest pain, unspecified: Secondary | ICD-10-CM

## 2015-12-04 MED ORDER — REGADENOSON 0.4 MG/5ML IV SOLN
INTRAVENOUS | Status: AC
  Filled 2015-12-04: qty 5

## 2015-12-04 MED ORDER — TECHNETIUM TC 99M SESTAMIBI GENERIC - CARDIOLITE
10.0000 | Freq: Once | INTRAVENOUS | Status: AC | PRN
  Administered 2015-12-04: 10 via INTRAVENOUS

## 2015-12-04 MED ORDER — REGADENOSON 0.4 MG/5ML IV SOLN
0.4000 mg | Freq: Once | INTRAVENOUS | Status: AC
  Administered 2015-12-04: 0.4 mg via INTRAVENOUS

## 2015-12-04 MED ORDER — TECHNETIUM TC 99M SESTAMIBI GENERIC - CARDIOLITE
30.0000 | Freq: Once | INTRAVENOUS | Status: AC | PRN
  Administered 2015-12-04: 30 via INTRAVENOUS

## 2015-12-06 ENCOUNTER — Other Ambulatory Visit: Payer: Self-pay | Admitting: Internal Medicine

## 2015-12-06 DIAGNOSIS — E785 Hyperlipidemia, unspecified: Secondary | ICD-10-CM

## 2015-12-06 MED ORDER — ATORVASTATIN CALCIUM 20 MG PO TABS: 20.0000 mg | ORAL_TABLET | Freq: Every day | ORAL | Status: DC

## 2015-12-07 ENCOUNTER — Telehealth: Payer: Self-pay

## 2015-12-07 NOTE — Telephone Encounter (Signed)
-----   Message from Ambrose Finland, NP sent at 12/06/2015  8:36 PM EST ----- Please have patient to read off his medication bottles and make sure he is not on cholesterol medication. He has a cardiologist but does not know if he is on a statin. If not I will send atorvastatin 20 mg to take once daily.

## 2015-12-07 NOTE — Telephone Encounter (Signed)
Called patient to try to verify his medications Patient not available Message left on voice mail to return our call

## 2015-12-08 ENCOUNTER — Telehealth: Payer: Self-pay

## 2015-12-08 NOTE — Telephone Encounter (Signed)
Patient returned phone call  Patient is out running errands and does not have his medications with him Will call me back when he gets home Patient is also requesting an antibiotic for his congestion

## 2015-12-09 ENCOUNTER — Telehealth: Payer: Self-pay

## 2015-12-09 NOTE — Telephone Encounter (Signed)
Returned patient phone call Patient not available Message left on voice mail to return our call 

## 2015-12-17 ENCOUNTER — Encounter (HOSPITAL_COMMUNITY): Payer: Medicaid Other

## 2015-12-17 ENCOUNTER — Telehealth: Payer: Self-pay

## 2015-12-17 NOTE — Telephone Encounter (Signed)
Peter Booker from vascular department called to state This patient was a NO SHOW for his appointment for ABI

## 2016-01-09 ENCOUNTER — Emergency Department (HOSPITAL_COMMUNITY): Payer: Medicaid Other

## 2016-01-09 ENCOUNTER — Inpatient Hospital Stay (HOSPITAL_COMMUNITY)
Admission: EM | Admit: 2016-01-09 | Discharge: 2016-01-10 | DRG: 313 | Disposition: A | Discharge status: Home / Self Care | Payer: Medicaid Other | Attending: Cardiovascular Disease | Admitting: Cardiovascular Disease

## 2016-01-09 ENCOUNTER — Encounter (HOSPITAL_COMMUNITY): Payer: Self-pay | Admitting: Emergency Medicine

## 2016-01-09 DIAGNOSIS — Z79899 Other long term (current) drug therapy: Secondary | ICD-10-CM | POA: Diagnosis not present

## 2016-01-09 DIAGNOSIS — Z8249 Family history of ischemic heart disease and other diseases of the circulatory system: Secondary | ICD-10-CM | POA: Diagnosis not present

## 2016-01-09 DIAGNOSIS — I10 Essential (primary) hypertension: Secondary | ICD-10-CM | POA: Diagnosis present

## 2016-01-09 DIAGNOSIS — I2510 Atherosclerotic heart disease of native coronary artery without angina pectoris: Secondary | ICD-10-CM | POA: Diagnosis present

## 2016-01-09 DIAGNOSIS — I252 Old myocardial infarction: Secondary | ICD-10-CM

## 2016-01-09 DIAGNOSIS — I249 Acute ischemic heart disease, unspecified: Secondary | ICD-10-CM | POA: Diagnosis present

## 2016-01-09 DIAGNOSIS — K219 Gastro-esophageal reflux disease without esophagitis: Secondary | ICD-10-CM | POA: Diagnosis present

## 2016-01-09 DIAGNOSIS — R079 Chest pain, unspecified: Secondary | ICD-10-CM

## 2016-01-09 DIAGNOSIS — I340 Nonrheumatic mitral (valve) insufficiency: Secondary | ICD-10-CM | POA: Diagnosis present

## 2016-01-09 DIAGNOSIS — Z885 Allergy status to narcotic agent status: Secondary | ICD-10-CM

## 2016-01-09 DIAGNOSIS — E785 Hyperlipidemia, unspecified: Secondary | ICD-10-CM | POA: Diagnosis present

## 2016-01-09 DIAGNOSIS — M109 Gout, unspecified: Secondary | ICD-10-CM | POA: Diagnosis present

## 2016-01-09 DIAGNOSIS — Z720 Tobacco use: Secondary | ICD-10-CM

## 2016-01-09 DIAGNOSIS — Z7982 Long term (current) use of aspirin: Secondary | ICD-10-CM | POA: Diagnosis not present

## 2016-01-09 DIAGNOSIS — Z955 Presence of coronary angioplasty implant and graft: Secondary | ICD-10-CM

## 2016-01-09 DIAGNOSIS — I420 Dilated cardiomyopathy: Secondary | ICD-10-CM | POA: Diagnosis present

## 2016-01-09 DIAGNOSIS — Z951 Presence of aortocoronary bypass graft: Secondary | ICD-10-CM

## 2016-01-09 DIAGNOSIS — R0789 Other chest pain: Principal | ICD-10-CM | POA: Diagnosis present

## 2016-01-09 LAB — TROPONIN I
TROPONIN I: 0.03 ng/mL (ref <0.031)
TROPONIN I: 0.03 ng/mL (ref <0.031)
Troponin I: 0.03 ng/mL (ref <0.031)

## 2016-01-09 LAB — BASIC METABOLIC PANEL
ANION GAP: 11 (ref 5–15)
BUN: 13 mg/dL (ref 6–20)
CHLORIDE: 105 mmol/L (ref 101–111)
CO2: 21 mmol/L — ABNORMAL LOW (ref 22–32)
Calcium: 9.1 mg/dL (ref 8.9–10.3)
Creatinine, Ser: 1.09 mg/dL (ref 0.61–1.24)
Glucose, Bld: 133 mg/dL — ABNORMAL HIGH (ref 65–99)
POTASSIUM: 4.2 mmol/L (ref 3.5–5.1)
SODIUM: 137 mmol/L (ref 135–145)

## 2016-01-09 LAB — CBC
HEMATOCRIT: 43.5 % (ref 39.0–52.0)
HEMOGLOBIN: 13.5 g/dL (ref 13.0–17.0)
MCH: 24.8 pg — ABNORMAL LOW (ref 26.0–34.0)
MCHC: 31 g/dL (ref 30.0–36.0)
MCV: 80 fL (ref 78.0–100.0)
Platelets: 189 10*3/uL (ref 150–400)
RBC: 5.44 MIL/uL (ref 4.22–5.81)
RDW: 19.2 % — AB (ref 11.5–15.5)
WBC: 7 10*3/uL (ref 4.0–10.5)

## 2016-01-09 LAB — I-STAT TROPONIN, ED
TROPONIN I, POC: 0.01 ng/mL (ref 0.00–0.08)
TROPONIN I, POC: 0.15 ng/mL — AB (ref 0.00–0.08)
Troponin i, poc: 0.02 ng/mL (ref 0.00–0.08)

## 2016-01-09 LAB — PROTIME-INR
INR: 1.08 (ref 0.00–1.49)
PROTHROMBIN TIME: 14.2 s (ref 11.6–15.2)

## 2016-01-09 LAB — HEPARIN LEVEL (UNFRACTIONATED): Heparin Unfractionated: <0.1 IU/mL — ABNORMAL LOW (ref 0.30–0.70)

## 2016-01-09 LAB — BRAIN NATRIURETIC PEPTIDE: B Natriuretic Peptide: 192.5 pg/mL — ABNORMAL HIGH (ref 0.0–100.0)

## 2016-01-09 MED ORDER — PANTOPRAZOLE SODIUM 40 MG IV SOLR
40.0000 mg | INTRAVENOUS | Status: DC
  Administered 2016-01-09 – 2016-01-10 (×2): 40 mg via INTRAVENOUS
  Filled 2016-01-09 (×2): qty 40

## 2016-01-09 MED ORDER — ALBUTEROL SULFATE (2.5 MG/3ML) 0.083% IN NEBU: 2.5000 mg | INHALATION_SOLUTION | Freq: Four times a day (QID) | RESPIRATORY_TRACT | Status: DC | PRN

## 2016-01-09 MED ORDER — ASPIRIN EC 81 MG PO TBEC
81.0000 mg | DELAYED_RELEASE_TABLET | Freq: Every day | ORAL | Status: DC
  Administered 2016-01-10: 81 mg via ORAL
  Filled 2016-01-09: qty 1

## 2016-01-09 MED ORDER — SACUBITRIL-VALSARTAN 24-26 MG PO TABS
1.0000 | ORAL_TABLET | Freq: Two times a day (BID) | ORAL | Status: DC
  Administered 2016-01-09 – 2016-01-10 (×2): 1 via ORAL
  Filled 2016-01-09 (×4): qty 1

## 2016-01-09 MED ORDER — FLUTICASONE PROPIONATE 50 MCG/ACT NA SUSP
2.0000 | Freq: Every day | NASAL | Status: DC
  Administered 2016-01-09 – 2016-01-10 (×2): 2 via NASAL
  Filled 2016-01-09: qty 16

## 2016-01-09 MED ORDER — ATORVASTATIN CALCIUM 20 MG PO TABS
20.0000 mg | ORAL_TABLET | Freq: Every day | ORAL | Status: DC
  Administered 2016-01-09 – 2016-01-10 (×2): 20 mg via ORAL
  Filled 2016-01-09 (×2): qty 1

## 2016-01-09 MED ORDER — SODIUM CHLORIDE 0.9 % IV SOLN
INTRAVENOUS | Status: DC
Start: 2016-01-09 — End: 2016-01-10
  Administered 2016-01-09: 10:00:00 via INTRAVENOUS

## 2016-01-09 MED ORDER — HEPARIN BOLUS VIA INFUSION
2000.0000 [IU] | Freq: Once | INTRAVENOUS | Status: AC
  Administered 2016-01-09: 2000 [IU] via INTRAVENOUS
  Filled 2016-01-09: qty 2000

## 2016-01-09 MED ORDER — ONDANSETRON HCL 4 MG/2ML IJ SOLN: 4.0000 mg | Freq: Four times a day (QID) | INTRAMUSCULAR | Status: DC | PRN

## 2016-01-09 MED ORDER — ACETAMINOPHEN 325 MG PO TABS
650.0000 mg | ORAL_TABLET | ORAL | Status: DC | PRN
  Administered 2016-01-10: 650 mg via ORAL
  Filled 2016-01-09 (×2): qty 2

## 2016-01-09 MED ORDER — CLOPIDOGREL BISULFATE 75 MG PO TABS
75.0000 mg | ORAL_TABLET | Freq: Every day | ORAL | Status: DC
  Administered 2016-01-09 – 2016-01-10 (×2): 75 mg via ORAL
  Filled 2016-01-09 (×2): qty 1

## 2016-01-09 MED ORDER — CARVEDILOL 3.125 MG PO TABS
3.1250 mg | ORAL_TABLET | Freq: Two times a day (BID) | ORAL | Status: DC
  Filled 2016-01-09: qty 1

## 2016-01-09 MED ORDER — NITROGLYCERIN 0.4 MG SL SUBL
0.4000 mg | SUBLINGUAL_TABLET | SUBLINGUAL | Status: DC | PRN
  Filled 2016-01-09: qty 1

## 2016-01-09 MED ORDER — ASPIRIN 81 MG PO CHEW
324.0000 mg | CHEWABLE_TABLET | ORAL | Status: AC
Start: 2016-01-09 — End: 2016-01-10

## 2016-01-09 MED ORDER — ALPRAZOLAM 0.25 MG PO TABS
0.2500 mg | ORAL_TABLET | Freq: Three times a day (TID) | ORAL | Status: DC | PRN
  Administered 2016-01-09 – 2016-01-10 (×2): 0.25 mg via ORAL
  Filled 2016-01-09 (×2): qty 1

## 2016-01-09 MED ORDER — HEPARIN (PORCINE) IN NACL 100-0.45 UNIT/ML-% IJ SOLN
1450.0000 [IU]/h | INTRAMUSCULAR | Status: DC
  Administered 2016-01-09: 1200 [IU]/h via INTRAVENOUS
  Administered 2016-01-10: 1450 [IU]/h via INTRAVENOUS
  Filled 2016-01-09 (×2): qty 250

## 2016-01-09 MED ORDER — FUROSEMIDE 40 MG PO TABS
40.0000 mg | ORAL_TABLET | Freq: Every day | ORAL | Status: DC
  Administered 2016-01-10: 40 mg via ORAL
  Filled 2016-01-09 (×2): qty 1

## 2016-01-09 MED ORDER — TRAZODONE HCL 50 MG PO TABS
25.0000 mg | ORAL_TABLET | Freq: Every evening | ORAL | Status: DC | PRN
  Administered 2016-01-09: 50 mg via ORAL
  Filled 2016-01-09: qty 1

## 2016-01-09 MED ORDER — ASPIRIN 300 MG RE SUPP: 300.0000 mg | RECTAL | Status: AC

## 2016-01-09 MED ORDER — ASPIRIN 325 MG PO TABS
325.0000 mg | ORAL_TABLET | Freq: Once | ORAL | Status: AC
  Administered 2016-01-09: 325 mg via ORAL
  Filled 2016-01-09: qty 1

## 2016-01-09 MED ORDER — ASPIRIN 81 MG PO CHEW
81.0000 mg | CHEWABLE_TABLET | Freq: Once | ORAL | Status: DC
  Filled 2016-01-09: qty 1

## 2016-01-09 MED ORDER — SPIRONOLACTONE 25 MG PO TABS: 12.5000 mg | ORAL_TABLET | Freq: Every day | ORAL | Status: DC

## 2016-01-09 MED ORDER — HEPARIN BOLUS VIA INFUSION
4000.0000 [IU] | Freq: Once | INTRAVENOUS | Status: AC
  Administered 2016-01-09: 4000 [IU] via INTRAVENOUS
  Filled 2016-01-09: qty 4000

## 2016-01-09 NOTE — ED Notes (Signed)
MD at bedside. 

## 2016-01-09 NOTE — H&P (Signed)
Referring Physician:  [name] is an 66 y.o. male.                       Chief Complaint: Chest pain  HPI: 65 year old male with hx CAD, s/p MI with PCI w DES LAD, CABG 2015, CHF with EF 35%, HTN, prior tobacco abuse, GERD p/w burning central chest pain with radiation into the jaw and diaphoresis that occurred twice this morning while walking. Initially the symptoms began while walking down a hill. He is having no pain and no symptoms at rest. Had URI recently but states this has resolved. Denies cough, SOB. Has had pain in his bilateral legs without swelling for the past 3 weeks, has ABIs scheduled outpatient.  Past Medical History  Diagnosis Date  . Coronary artery disease     DES proximal LAD  . GERD (gastroesophageal reflux disease)   . MI (mitral incompetence)     anterolateral MI October 2014  . HTN (hypertension)   . Anginal pain ([place])   . Myocardial infarction ([place])   . Shortness of breath   . Arthritis     OSTEO  . Gout   . Hyperlipemia   . Asthma       Past Surgical History  Procedure Laterality Date  . Coronary stent placement  2000, 2014  . Bilateral shoulder arthroscopy    . Coronary artery bypass graft N/A 05/01/2014    Procedure: CORONARY ARTERY BYPASS GRAFTING (CABG);  Surgeon: Gaye Pollack, MD;  Location: [place];  Service: Open Heart Surgery;  Laterality: N/A;  Times 5 using left internal mammary artery and endoscopically harvested right saphenous vein  . Intraoperative transesophageal echocardiogram N/A 05/01/2014    Procedure: INTRAOPERATIVE TRANSESOPHAGEAL ECHOCARDIOGRAM;  Surgeon: Gaye Pollack, MD;  Location: [facility] OR;  Service: Open Heart Surgery;  Laterality: N/A;  . Left heart catheterization with coronary angiogram N/A 07/29/2013    Procedure: LEFT HEART CATHETERIZATION WITH CORONARY ANGIOGRAM;  Surgeon: Clent Demark, MD;  Location: [facility] CATH LAB;  Service: Cardiovascular;  Laterality: N/A;  . Percutaneous stent intervention  07/29/2013     Procedure: PERCUTANEOUS STENT INTERVENTION;  Surgeon: Clent Demark, MD;  Location: [place] CATH LAB;  Service: Cardiovascular;;  . Left heart catheterization with coronary angiogram N/A 04/24/2014    Procedure: LEFT HEART CATHETERIZATION WITH CORONARY ANGIOGRAM;  Surgeon: Burnell Blanks, MD;  Location: [facility] CATH LAB;  Service: Cardiovascular;  Laterality: N/A;    Family History  Problem Relation Age of Onset  . CAD Father   . Heart attack Brother 44    x 3  . Heart attack Father 66  . Cancer Sister   . Cancer Mother   . Stroke Mother    Social History:  reports that he has quit smoking. His smoking use included Cigarettes. He has a 27 pack-year smoking history. His smokeless tobacco use includes Chew. He reports that he does not drink alcohol or use illicit drugs.  Allergies:  Allergies  Allergen Reactions  . Codeine Itching  . Oxycodone Itching     (Not in a hospital admission)  Results for orders placed or performed during the hospital encounter of 01/09/16 (from the past 48 hour(s))  Basic metabolic panel     Status: Abnormal   Collection Time: 01/09/16  7:21 AM  Result Value Ref Range   Sodium 137 135 - 145 mmol/L   Potassium 4.2 3.5 - 5.1 mmol/L   Chloride 105 101 -  111 mmol/L   CO2 21 (L) 22 - 32 mmol/L   Glucose, Bld 133 (H) 65 - 99 mg/dL   BUN 13 6 - 20 mg/dL   Creatinine, Ser 1.09 0.61 - 1.24 mg/dL   Calcium 9.1 8.9 - 10.3 mg/dL   GFR calc non Af Amer >60 >60 mL/min   GFR calc Af Amer >60 >60 mL/min    Comment: (NOTE) The eGFR has been calculated using the CKD EPI equation. This calculation has not been validated in all clinical situations. eGFR's persistently <60 mL/min signify possible Chronic Kidney Disease.    Anion gap 11 5 - 15  CBC     Status: Abnormal   Collection Time: 01/09/16  7:21 AM  Result Value Ref Range   WBC 7.0 4.0 - 10.5 K/uL   RBC 5.44 4.22 - 5.81 MIL/uL   Hemoglobin 13.5 13.0 - 17.0 g/dL   HCT 43.5 39.0 - 52.0 %   MCV 80.0  78.0 - 100.0 fL   MCH 24.8 (L) 26.0 - 34.0 pg   MCHC 31.0 30.0 - 36.0 g/dL   RDW 19.2 (H) 11.5 - 15.5 %   Platelets 189 150 - 400 K/uL  Protime-INR - (order if Patient is taking Coumadin / Warfarin)     Status: None   Collection Time: 01/09/16  7:21 AM  Result Value Ref Range   Prothrombin Time 14.2 11.6 - 15.2 seconds   INR 1.08 0.00 - 1.49  Brain natriuretic peptide     Status: Abnormal   Collection Time: 01/09/16  7:21 AM  Result Value Ref Range   B Natriuretic Peptide 192.5 (H) 0.0 - 100.0 pg/mL  I-stat troponin, ED (not at [facility], [facility])     Status: None   Collection Time: 01/09/16  7:27 AM  Result Value Ref Range   Troponin i, poc 0.02 0.00 - 0.08 ng/mL   Comment 3            Comment: Due to the release kinetics of cTnI, a negative result within the first hours of the onset of symptoms does not rule out myocardial infarction with certainty. If myocardial infarction is still suspected, repeat the test at appropriate intervals.   I-stat troponin, ED     Status: Abnormal   Collection Time: 01/09/16  7:35 AM  Result Value Ref Range   Troponin i, poc 0.15 (HH) 0.00 - 0.08 ng/mL   Comment NOTIFIED PHYSICIAN    Comment 3            Comment: Due to the release kinetics of cTnI, a negative result within the first hours of the onset of symptoms does not rule out myocardial infarction with certainty. If myocardial infarction is still suspected, repeat the test at appropriate intervals.   I-stat troponin, ED     Status: None   Collection Time: 01/09/16  8:20 AM  Result Value Ref Range   Troponin i, poc 0.01 0.00 - 0.08 ng/mL   Comment 3            Comment: Due to the release kinetics of cTnI, a negative result within the first hours of the onset of symptoms does not rule out myocardial infarction with certainty. If myocardial infarction is still suspected, repeat the test at appropriate intervals.    Dg Chest Portable 1 View  01/09/2016  CLINICAL DATA:  Burning sensation  across chest this morning. History of CAD, myocardial infarction, hypertension and asthma. EXAM: PORTABLE CHEST 1 VIEW COMPARISON:  03/29/2015; 12/29/2014; 08/16/2014  FINDINGS: Grossly unchanged borderline enlarged cardiac silhouette and mediastinal contours post median sternotomy. Post coronary artery stent placement. Minimal bibasilar linear heterogeneous opacities are unchanged in favored to represent atelectasis or scar. No new focal airspace opacities. No pleural effusion or pneumothorax. No evidence of edema. No acute osseous abnormalities. Stable sequela of suspected prior mid/distal left clavicular fracture and posttraumatic deformity of the bilateral scapula with the right cancellous screw again noted to be fracture and the left cancellous screw potentially displaced. IMPRESSION: No acute cardiopulmonary disease on this AP portable examination. Electronically Signed   By: Sandi Mariscal M.D.   On: 01/09/2016 08:11    Review Of Systems Constitutional: Negative for fever, chills and weight loss.  Eyes: Negative for blurred vision.  Respiratory: Negative for cough, hemoptysis and sputum production.  Cardiovascular: Positive for chest pain. Negative for palpitations, orthopnea and claudication.  Gastrointestinal: Negative for nausea, vomiting, abdominal pain and diarrhea.  Genitourinary: Negative for dysuria.  Neurological: Negative for dizziness and headaches.   Blood pressure 110/71, pulse 71, temperature 98 F (36.7 C), temperature source Oral, resp. rate 15, height _0  (1.727 m), weight 99.791 kg (220 lb), SpO2 95 %.  Physical Exam  Constitutional: He appears well-developed and well-nourished. No distress.  HENT: Head: Normocephalic and atraumatic. Conj-pink, Sclera-non-icteric  Neck: Neck supple. No JVD. Cardiovascular: Normal rate and regular rhythm.  Pulmonary/Chest: Effort normal and breath sounds normal. No respiratory distress. Has midline scar of surgery. Abdominal: Soft. He  exhibits no distension and no mass. There is no tenderness. There is no rebound and no guarding.  Musculoskeletal: He exhibits no edema.  Neurological: He is alert. He exhibits normal muscle tone. Moves all 4 extremities. Skin: Warm and dry, not diaphoretic. Nursing note and vitals reviewed.  Assessment/Plan Acute coronary syndrome CAD Old MI Hypertension GERD Mitral regurgitation Hyperlipidemia Asthma Gout S/P CABG  Admit. IV NTG. IV heparin. IV Protonix.  Birdie Riddle, MD  01/09/2016, 8:44 AM

## 2016-01-09 NOTE — ED Provider Notes (Signed)
CSN: [id number]     Arrival date & time 01/09/16  4098 History   First MD Initiated Contact with Patient 01/09/16 [phone]     Chief Complaint  Patient presents with  . Chest Pain     (Consider location/radiation/quality/duration/timing/severity/associated sxs/prior Treatment) HPI   Pt with hx CAD, s/p MI with PCI w DES LAD, CABG 2015, CHF with EF 35%, HTN, prior tobacco abuse, GERD p/w burning central chest pain with radiation into the jaw and diaphoresis that occurred twice this morning while walking.  Initially the symptoms began while walking down a hill.  He is having no pain and no symptoms at rest.  Had URI recently but states this has resolved.  Denies cough, SOB.  Has had pain in his bilateral legs without swelling for the past 3 weeks, has ABIs scheduled outpatient.  Cardiologist is Dr Sharyn Lull   Past Medical History  Diagnosis Date  . Coronary artery disease     DES proximal LAD  . GERD (gastroesophageal reflux disease)   . MI (mitral incompetence)     anterolateral MI October 2014  . HTN (hypertension)   . Anginal pain ([facility])   . Myocardial infarction ([facility])   . Shortness of breath   . Arthritis     OSTEO  . Gout   . Hyperlipemia   . Asthma    Past Surgical History  Procedure Laterality Date  . Coronary stent placement  2000, 2014  . Bilateral shoulder arthroscopy    . Coronary artery bypass graft N/A 05/01/2014    Procedure: CORONARY ARTERY BYPASS GRAFTING (CABG);  Surgeon: Alleen Borne, MD;  Location: [facility] OR;  Service: Open Heart Surgery;  Laterality: N/A;  Times 5 using left internal mammary artery and endoscopically harvested right saphenous vein  . Intraoperative transesophageal echocardiogram N/A 05/01/2014    Procedure: INTRAOPERATIVE TRANSESOPHAGEAL ECHOCARDIOGRAM;  Surgeon: Alleen Borne, MD;  Location: [facility] OR;  Service: Open Heart Surgery;  Laterality: N/A;  . Left heart catheterization with coronary angiogram N/A 07/29/2013    Procedure: LEFT HEART  CATHETERIZATION WITH CORONARY ANGIOGRAM;  Surgeon: Robynn Pane, MD;  Location: [facility] CATH LAB;  Service: Cardiovascular;  Laterality: N/A;  . Percutaneous stent intervention  07/29/2013    Procedure: PERCUTANEOUS STENT INTERVENTION;  Surgeon: Robynn Pane, MD;  Location: [facility] CATH LAB;  Service: Cardiovascular;;  . Left heart catheterization with coronary angiogram N/A 04/24/2014    Procedure: LEFT HEART CATHETERIZATION WITH CORONARY ANGIOGRAM;  Surgeon: Kathleene Hazel, MD;  Location: [facility] CATH LAB;  Service: Cardiovascular;  Laterality: N/A;   Family History  Problem Relation Age of Onset  . CAD Father   . Heart attack Brother 44    x 3  . Heart attack Father 58  . Cancer Sister   . Cancer Mother   . Stroke Mother    Social History  Substance Use Topics  . Smoking status: Former Smoker -- 0.50 packs/day for 54 years    Types: Cigarettes  . Smokeless tobacco: Current User    Types: Chew  . Alcohol Use: No     Comment: former    Review of Systems  All other systems reviewed and are negative.     Allergies  Codeine and Oxycodone  Home Medications   Prior to Admission medications   Medication Sig Start Date End Date Taking? Authorizing Provider  albuterol (PROVENTIL) (2.5 MG/3ML) 0.083% nebulizer solution Take 3 mLs (2.5 mg total) by nebulization every 6 (six) hours as needed for wheezing  or shortness of breath. 12/03/15   Ambrose Finland, NP  ALPRAZolam Prudy Feeler) 0.5 MG tablet Take 1 tablet (0.5 mg total) by mouth at bedtime as needed for anxiety. 06/05/14   Doris Cheadle, MD  aspirin EC 325 MG EC tablet Take 1 tablet (325 mg total) by mouth daily. 05/06/14   Donielle Margaretann Loveless, PA-C  atorvastatin (LIPITOR) 20 MG tablet Take 1 tablet (20 mg total) by mouth daily. 12/06/15   Ambrose Finland, NP  carvedilol (COREG) 3.125 MG tablet Take 1 tablet (3.125 mg total) by mouth 2 (two) times daily with a meal. 06/03/14   Kathleene Hazel, MD  esomeprazole (NEXIUM) 40 MG capsule  Take 40 mg by mouth daily at 12 noon.    Historical Provider, MD  fluticasone (FLONASE) 50 MCG/ACT nasal spray Place 2 sprays into both nostrils daily. 12/03/15   Ambrose Finland, NP  furosemide (LASIX) 40 MG tablet Take 1 tablet (40 mg total) by mouth daily. Patient not taking: Reported on 12/03/2015 03/30/15   Tomasita Crumble, MD  nitroGLYCERIN (NITROSTAT) 0.4 MG SL tablet Place 0.4 mg under the tongue every 5 (five) minutes as needed for chest pain.    Historical Provider, MD  oxyCODONE-acetaminophen (PERCOCET/ROXICET) 5-325 MG per tablet Take 1 tablet by mouth every 8 (eight) hours as needed for moderate pain or severe pain. 05/29/14   Delight Ovens, MD  pantoprazole (PROTONIX) 40 MG tablet Take 40 mg by mouth daily.    Historical Provider, MD  predniSONE (DELTASONE) 20 MG tablet 3 tabs po day one, then 2 tabs daily x 4 days Patient not taking: Reported on 12/03/2015 04/16/15   Loren Racer, MD  spironolactone (ALDACTONE) 25 MG tablet Take 12.5 mg by mouth daily.    Historical Provider, MD  traZODone (DESYREL) 50 MG tablet Take 0.5-1 tablets (25-50 mg total) by mouth at bedtime as needed for sleep. 12/03/15   Ambrose Finland, NP   BP 117/78 mmHg  Pulse 92  Temp(Src) 98 F (36.7 C) (Oral)  Resp 15  Ht 5\' 8"  (1.727 m)  Wt 99.791 kg  BMI 33.46 kg/m2  SpO2 95% Physical Exam  Constitutional: He appears well-developed and well-nourished. No distress.  HENT:  Head: Normocephalic and atraumatic.  Neck: Neck supple.  Cardiovascular: Normal rate and regular rhythm.   Pulmonary/Chest: Effort normal and breath sounds normal. No respiratory distress. He has no wheezes. He has no rales.  Abdominal: Soft. He exhibits no distension and no mass. There is no tenderness. There is no rebound and no guarding.  Musculoskeletal: He exhibits no edema.  Neurological: He is alert. He exhibits normal muscle tone.  Skin: He is not diaphoretic.  Nursing note and vitals reviewed.   ED Course  Procedures  (including critical care time) Labs Review Labs Reviewed  BASIC METABOLIC PANEL - Abnormal; Notable for the following:    CO2 21 (*)    Glucose, Bld 133 (*)    All other components within normal limits  CBC - Abnormal; Notable for the following:    MCH 24.8 (*)    RDW 19.2 (*)    All other components within normal limits  BRAIN NATRIURETIC PEPTIDE - Abnormal; Notable for the following:    B Natriuretic Peptide 192.5 (*)    All other components within normal limits  I-STAT TROPOININ, ED - Abnormal; Notable for the following:    Troponin i, poc 0.15 (*)    All other components within normal limits  PROTIME-INR  HEPARIN LEVEL (  UNFRACTIONATED)  TROPONIN I  TROPONIN I  TROPONIN I  I-STAT TROPOININ, ED  I-STAT TROPOININ, ED    Imaging Review Dg Chest Portable 1 View  01/09/2016  CLINICAL DATA:  Burning sensation across chest this morning. History of CAD, myocardial infarction, hypertension and asthma. EXAM: PORTABLE CHEST 1 VIEW COMPARISON:  03/29/2015; 12/29/2014; 08/16/2014 FINDINGS: Grossly unchanged borderline enlarged cardiac silhouette and mediastinal contours post median sternotomy. Post coronary artery stent placement. Minimal bibasilar linear heterogeneous opacities are unchanged in favored to represent atelectasis or scar. No new focal airspace opacities. No pleural effusion or pneumothorax. No evidence of edema. No acute osseous abnormalities. Stable sequela of suspected prior mid/distal left clavicular fracture and posttraumatic deformity of the bilateral scapula with the right cancellous screw again noted to be fracture and the left cancellous screw potentially displaced. IMPRESSION: No acute cardiopulmonary disease on this AP portable examination. Electronically Signed   By: Simonne Come M.D.   On: 01/09/2016 08:11   I have personally reviewed and evaluated these images and lab results as part of my medical decision-making.   EKG Interpretation   Date/Time:  Saturday January 09 2016 07:57:39 EDT Ventricular Rate:  90 PR Interval:  187 QRS Duration: 198 QT Interval:  473 QTC Calculation: 579 R Axis:   -122 Text Interpretation:  Atrial fibrillation Paired ventricular premature  complexes Right bundle branch block ST elevation in inferior and lateral  leads concerning for STEMI Confirmed by LITTLE MD, RACHEL ([id number]) on  01/09/2016 8:06:17 AM       7:19 AM Pt seen and examined, discussed with Dr Clarene Duke.  EKG reviewed.  Pt currently CP free.    8:07 AM STEMI called given new EKG changes, secretary notified verbally, order placed.    8:35 AM Pt has been seen and accepted by Dr Algie Coffer who will place in step down.  He reports EKG has returned to normal.    Pt initially had two troponins resulted within minutes of each other - one was normal and the other elevated.  We believe that the elevated troponin was actually a different patient's troponin that resulted to this patient's chart incorrectly.  Lab was repeated for clarification and this troponin was negative.    MDM   Final diagnoses:  Chest pain, unspecified chest pain type    Patient with hx CAD s/p MI, stent, CABG, also with heart failure p/w exertion chest and jaw pain with diaphoresis that occurred this morning.  Initial troponin is negative (see discussion above). Labs, CXR unremarkable. Pt demonstrated dramatic EKG changes with ST elevation in inferior and lateral leads on second EKG, later resolved.  Pt called STEMI after second EKG but cancelled by Dr Algie Coffer of patient's cardiology group.  Per Dr Algie Coffer, pt to be admitted to stepdown under his care.     Trixie Dredge, PA-C 01/09/16 3335  Laurence Spates, MD 01/09/16 [phone]

## 2016-01-09 NOTE — Progress Notes (Signed)
ANTICOAGULATION CONSULT NOTE  Pharmacy Consult for Heparin  Indication: chest pain/ACS  Allergies  Allergen Reactions  . Codeine Itching  . Oxycodone Itching    Patient Measurements: Height: 5\' 8"  (172.7 cm) Weight: 213 lb 10 oz (96.9 kg) IBW/kg (Calculated) : 68.4 Heparin Dosing Weight: 89 kg   Vital Signs: Temp: 97.7 F (36.5 C) (03/25 1110) Temp Source: Oral (03/25 1110) BP: 104/67 mmHg (03/25 1110) Pulse Rate: 67 (03/25 1110)  Labs:  Recent Labs  01/09/16 0721 01/09/16 0904 01/09/16 1427  HGB 13.5  --   --   HCT 43.5  --   --   PLT 189  --   --   LABPROT 14.2  --   --   INR 1.08  --   --   HEPARINUNFRC  --   --  <0.10*  CREATININE 1.09  --   --   TROPONINI  --  0.03 0.03    Estimated Creatinine Clearance: 78.3 mL/min (by C-G formula based on Cr of 1.09).    Assessment: 53 YOM with hx of CAD s/p MI with PCI presents with chest pain and diaphoresis.  Pharmacy consulted to start IV heparin for ACS (originally CODE STEMI but plans for cath were cancelled due to EKG findings). -Initial heparin level was < 0.1. Per RN when patient arrived to the unit heparin was off (the duration of time is not known)  Goal of Therapy:  Heparin level 0.3-0.7 units/ml Monitor platelets by anticoagulation protocol: Yes   Plan:  -Heparin bolus 2000 units -Continue heparin at 1200 units/hr -Heparin level in 6 hours and daily wth CBC daily  Harland German, Pharm D 01/09/2016 4:12 PM

## 2016-01-09 NOTE — ED Notes (Signed)
Per PA West, hold ASA at this time.

## 2016-01-09 NOTE — ED Notes (Addendum)
EKG changes noted, EKG complete given to MD Little., pt remains CP free.

## 2016-01-09 NOTE — ED Notes (Signed)
Cancel Code Stemi per Algie Coffer MD

## 2016-01-09 NOTE — ED Notes (Signed)
Per Pharmacist, hold heparin until pharmacist speaks with pt regarding blood thinner.  Pharmacist on way to speak with pt.

## 2016-01-09 NOTE — Progress Notes (Addendum)
ANTICOAGULATION CONSULT NOTE - Initial Consult  Pharmacy Consult for Heparin  Indication: chest pain/ACS  Allergies  Allergen Reactions  . Codeine Itching  . Oxycodone Itching    Patient Measurements: Height: 5\' 8"  (172.7 cm) Weight: 220 lb (99.791 kg) IBW/kg (Calculated) : 68.4 Heparin Dosing Weight: 89 kg   Vital Signs: Temp: 98 F (36.7 C) (03/25 0659) Temp Source: Oral (03/25 0659) BP: 101/64 mmHg (03/25 0742) Pulse Rate: 75 (03/25 0742)  Labs:  Recent Labs  01/09/16 0721  HGB 13.5  HCT 43.5  PLT 189  LABPROT 14.2  INR 1.08  CREATININE 1.09    Estimated Creatinine Clearance: 79.5 mL/min (by C-G formula based on Cr of 1.09).   Medical History: Past Medical History  Diagnosis Date  . Coronary artery disease     DES proximal LAD  . GERD (gastroesophageal reflux disease)   . MI (mitral incompetence)     anterolateral MI October 2014  . HTN (hypertension)   . Anginal pain ([facility])   . Myocardial infarction ([facility])   . Shortness of breath   . Arthritis     OSTEO  . Gout   . Hyperlipemia   . Asthma     Medications:   (Not in a hospital admission)  Assessment: 37 YOM with hx of CAD s/p MI with PCI presents with chest pain and diaphoresis. Troponin up to 0.15. Pharmacy consulted to start IV heparin for ACS. H/H and Plt wnl. Patient unsure whether he is on anticoagulants at home. Patient's pharmacy is not open yet. Per office notes from last month, patient was not on any anticoagulants neither does he have any indications for anticoagulants. Patient going to cath ab   Goal of Therapy:  Heparin level 0.3-0.7 units/ml Monitor platelets by anticoagulation protocol: Yes   Plan:  -Heparin 4000 units once followed by heparin 1200 units/hr  -F/u after cath  Vinnie Level, PharmD., BCPS Clinical Pharmacist Pager [phone]   Addendum: Per discussion with cardiology, EKG does not show STEMI. Cancel plans for cath. F/u 6 hr HL  Vinnie Level,  PharmD., BCPS Clinical Pharmacist Pager [phone]

## 2016-01-09 NOTE — ED Notes (Signed)
PA at bedside.

## 2016-01-09 NOTE — ED Notes (Signed)
Pt in reporting central CP with radiation to jaw 10/10. Also reports SOB. Significant cardiac hx

## 2016-01-09 NOTE — ED Notes (Signed)
EKG completed per MD Algie Coffer.  MD at bedside

## 2016-01-10 ENCOUNTER — Inpatient Hospital Stay (HOSPITAL_COMMUNITY): Payer: Medicaid Other

## 2016-01-10 LAB — BASIC METABOLIC PANEL
ANION GAP: 12 (ref 5–15)
BUN: 15 mg/dL (ref 6–20)
CHLORIDE: 107 mmol/L (ref 101–111)
CO2: 19 mmol/L — AB (ref 22–32)
Calcium: 8.6 mg/dL — ABNORMAL LOW (ref 8.9–10.3)
Creatinine, Ser: 1 mg/dL (ref 0.61–1.24)
GFR calc non Af Amer: >60 mL/min (ref >60)
GLUCOSE: 105 mg/dL — AB (ref 65–99)
Potassium: 4.5 mmol/L (ref 3.5–5.1)
Sodium: 138 mmol/L (ref 135–145)

## 2016-01-10 LAB — CBC
HCT: 41.2 % (ref 39.0–52.0)
Hemoglobin: 13.2 g/dL (ref 13.0–17.0)
MCH: 25.7 pg — ABNORMAL LOW (ref 26.0–34.0)
MCHC: 32 g/dL (ref 30.0–36.0)
MCV: 80.2 fL (ref 78.0–100.0)
Platelets: 183 10*3/uL (ref 150–400)
RBC: 5.14 MIL/uL (ref 4.22–5.81)
RDW: 19.4 % — ABNORMAL HIGH (ref 11.5–15.5)
WBC: 6.8 10*3/uL (ref 4.0–10.5)

## 2016-01-10 LAB — PROTIME-INR
INR: 1.06 (ref 0.00–1.49)
Prothrombin Time: 14 seconds (ref 11.6–15.2)

## 2016-01-10 LAB — LIPID PANEL
CHOL/HDL RATIO: 3.3 ratio
CHOLESTEROL: 133 mg/dL (ref 0–200)
HDL: 40 mg/dL — AB (ref >40)
LDL Cholesterol: 52 mg/dL (ref 0–99)
Triglycerides: 205 mg/dL — ABNORMAL HIGH (ref <150)
VLDL: 41 mg/dL — AB (ref 0–40)

## 2016-01-10 LAB — HEPARIN LEVEL (UNFRACTIONATED)
Heparin Unfractionated: 0.2 IU/mL — ABNORMAL LOW (ref 0.30–0.70)
Heparin Unfractionated: 0.5 IU/mL (ref 0.30–0.70)

## 2016-01-10 MED ORDER — REGADENOSON 0.4 MG/5ML IV SOLN
INTRAVENOUS | Status: AC
  Filled 2016-01-10: qty 5

## 2016-01-10 MED ORDER — PANTOPRAZOLE SODIUM 40 MG PO TBEC: 40.0000 mg | DELAYED_RELEASE_TABLET | Freq: Every day | ORAL | Status: DC

## 2016-01-10 MED ORDER — TECHNETIUM TC 99M SESTAMIBI - CARDIOLITE
30.0000 | Freq: Once | INTRAVENOUS | Status: AC | PRN
  Administered 2016-01-10: 09:00:00 30 via INTRAVENOUS

## 2016-01-10 MED ORDER — ISOSORBIDE MONONITRATE ER 30 MG PO TB24: 30.0000 mg | ORAL_TABLET | Freq: Every day | ORAL | Status: AC

## 2016-01-10 MED ORDER — TECHNETIUM TC 99M SESTAMIBI GENERIC - CARDIOLITE
10.0000 | Freq: Once | INTRAVENOUS | Status: AC | PRN
  Administered 2016-01-10: 10 via INTRAVENOUS

## 2016-01-10 MED ORDER — ISOSORBIDE MONONITRATE ER 30 MG PO TB24: 30.0000 mg | ORAL_TABLET | Freq: Every day | ORAL | Status: DC

## 2016-01-10 MED ORDER — DOCUSATE SODIUM 100 MG PO CAPS
100.0000 mg | ORAL_CAPSULE | Freq: Two times a day (BID) | ORAL | Status: DC
  Administered 2016-01-10: 100 mg via ORAL
  Filled 2016-01-10: qty 1

## 2016-01-10 MED ORDER — REGADENOSON 0.4 MG/5ML IV SOLN
0.4000 mg | Freq: Once | INTRAVENOUS | Status: DC
  Filled 2016-01-10: qty 5

## 2016-01-10 NOTE — Progress Notes (Signed)
ANTICOAGULATION CONSULT NOTE  Pharmacy Consult for Heparin  Indication: chest pain/ACS  Allergies  Allergen Reactions  . Codeine Itching  . Oxycodone Itching    Patient Measurements: Height: 5\' 8"  (172.7 cm) Weight: 213 lb 10 oz (96.9 kg) IBW/kg (Calculated) : 68.4 Heparin Dosing Weight: 89 kg   Vital Signs: Temp: 97.7 F (36.5 C) (03/25 2252) Temp Source: Oral (03/25 2252) BP: 111/59 mmHg (03/25 2252) Pulse Rate: 64 (03/25 2252)  Labs:  Recent Labs  01/09/16 0721 01/09/16 0904 01/09/16 1427 01/09/16 2018 01/10/16 0156  HGB 13.5  --   --   --   --   HCT 43.5  --   --   --   --   PLT 189  --   --   --   --   LABPROT 14.2  --   --   --   --   INR 1.08  --   --   --   --   HEPARINUNFRC  --   --  <0.10*  --  0.20*  CREATININE 1.09  --   --   --   --   TROPONINI  --  0.03 0.03 0.03  --     Estimated Creatinine Clearance: 78.3 mL/min (by C-G formula based on Cr of 1.09).  Assessment: 65 YOM on heparin for ACS. Heparin level remains subtherapeutic on 1200 units/hr. No issues with line or bleeding reported per RN.  Goal of Therapy:  Heparin level 0.3-0.7 units/ml Monitor platelets by anticoagulation protocol: Yes   Plan:  -Increase heparin to 1450 units/hr -Heparin level in 6 hours  Christoper Fabian, PharmD, BCPS Clinical pharmacist, pager [phone]  01/10/2016 3:01 AM

## 2016-01-10 NOTE — Progress Notes (Signed)
ANTICOAGULATION CONSULT NOTE  Pharmacy Consult for Heparin  Indication: chest pain/ACS  Allergies  Allergen Reactions  . Codeine Itching  . Oxycodone Itching    Patient Measurements: Height: 5\' 8"  (172.7 cm) Weight: 207 lb 1.6 oz (93.94 kg) IBW/kg (Calculated) : 68.4 Heparin Dosing Weight: 89 kg   Vital Signs: Temp: 97.8 F (36.6 C) (03/26 0425) BP: 120/77 mmHg (03/26 0914) Pulse Rate: 70 (03/26 0915)  Labs:  Recent Labs  01/09/16 0721 01/09/16 0904 01/09/16 1427 01/09/16 2018 01/10/16 0156 01/10/16 0442 01/10/16 0945  HGB 13.5  --   --   --   --  13.2  --   HCT 43.5  --   --   --   --  41.2  --   PLT 189  --   --   --   --  183  --   LABPROT 14.2  --   --   --   --  14.0  --   INR 1.08  --   --   --   --  1.06  --   HEPARINUNFRC  --   --  <0.10*  --  0.20*  --  0.50  CREATININE 1.09  --   --   --   --  1.00  --   TROPONINI  --  0.03 0.03 0.03  --   --   --     Estimated Creatinine Clearance: 84.1 mL/min (by C-G formula based on Cr of 1).  Assessment: 65 YOM on heparin for ACS. HL 0.5 (therapeutic) on 1450 units/hr. CBC stable no bleeding noted  Goal of Therapy:  Heparin level 0.3-0.7 units/ml Monitor platelets by anticoagulation protocol: Yes   Plan:  -Continue heparin to 1450 units/hr -Confirmatory Heparin level in 6 hours  Remi Haggard, PharmD Clinical Pharmacist- Resident Pager: [phone]   01/10/2016 11:09 AM

## 2016-01-10 NOTE — Discharge Instructions (Signed)

## 2016-01-10 NOTE — Discharge Summary (Signed)
Physician Discharge Summary  Patient ID: Peter THON Sr. MRN: [id number] DOB/AGE: 63-Jan-1952 63 y.o.  Admit date: 01/09/2016 Discharge date: 01/10/2016  Admission Diagnoses: Acute coronary syndrome CAD Old MI Hypertension GERD Mitral regurgitation Hyperlipidemia Asthma Gout S/P CABG  Discharge Diagnoses:  Principle problem: *Acute Coronary syndrome* Active Problems:   Chest pain   Dilated cardiomyopathy   CAD   Old MI   Hypertension   GERD   Mitral regurgitation   Hyperlipidemia   Asthma   Gout   S/P CABG  Discharged Condition: fair  Hospital Course: 65 year old male with hx CAD, s/p MI with PCI w DES LAD, CABG 2015, CHF with EF 35%, HTN, prior tobacco abuse, GERD p/w burning central chest pain with radiation into the jaw and diaphoresis that occurred twice this morning while walking. Initially the symptoms began while walking down a hill. He is having no pain and no symptoms at rest. Had URI recently but states this has resolved. Denies cough, SOB. Has had pain in his bilateral legs without swelling for the past 3 weeks, has ABIs scheduled outpatient. His cardiac Troponin levels were normal. His nuclear stress test was without significant reversible ischemia but had poor LV systolic function consistent with echocardiographic finding. He felt better with IV Protonix use and Imdur was also added. He will see Dr. Sharyn Lull and primary care doctor in 2-4 weeks.   Consults: cardiology  Significant Diagnostic Studies: labs: CBC and BMET are unremarkable with mild hyperglycemia. Troponin-I negative x 3. Lipid panel unremarkable except elevated triglycerides.  EKG-SR, RBBB.  Chest X-ray: Unremarkable.  Nuclear stress test: Large apical anterior, inferior, lateral and septal wall and apex. Mild peri-infarct ischemia. EF-31 %.  Treatments: cardiac meds: Entresto, carvedilol, Isosorbide, aspirin, Plavix, atorvastatin, spironolactone and Lasix.  Discharge Exam: Blood  pressure 120/77, pulse 70, temperature 97.8 F (36.6 C), temperature source Oral, resp. rate 19, height  (1.727 m), weight 93.94 kg (207 lb 1.6 oz), SpO2 97 %. Physical Exam  Constitutional: He appears well-developed and well-nourished. No distress.  HENT: Head: Normocephalic and atraumatic. Conj-pink, Sclera-non-icteric  Neck: Neck supple. No JVD. Cardiovascular: Normal rate and regular rhythm.  Pulmonary/Chest: Effort normal and breath sounds normal. No respiratory distress. Has midline scar of surgery. Abdominal: Soft. He exhibits no distension and no mass. There is no tenderness. There is no rebound and no guarding.  Musculoskeletal: He exhibits no edema.  Neurological: He is alert. He exhibits normal muscle tone. Moves all 4 extremities. Skin: Warm and dry, not diaphoretic. Nursing note and vitals reviewed.  Disposition: 01-Home or Self Care     Medication List    STOP taking these medications        esomeprazole 40 MG capsule  Commonly known as:  NEXIUM     oxyCODONE-acetaminophen 5-325 MG tablet  Commonly known as:  PERCOCET/ROXICET      TAKE these medications        albuterol (2.5 MG/3ML) 0.083% nebulizer solution  Commonly known as:  PROVENTIL  Take 3 mLs (2.5 mg total) by nebulization every 6 (six) hours as needed for wheezing or shortness of breath.     ALPRAZolam 0.5 MG tablet  Commonly known as:  XANAX  Take 1 tablet (0.5 mg total) by mouth at bedtime as needed for anxiety.     aspirin 325 MG EC tablet  Take 1 tablet (325 mg total) by mouth daily.     atorvastatin 20 MG tablet  Commonly known as:  LIPITOR  Take 1  tablet (20 mg total) by mouth daily.     carvedilol 3.125 MG tablet  Commonly known as:  COREG  Take 1 tablet (3.125 mg total) by mouth 2 (two) times daily with a meal.     clopidogrel 75 MG tablet  Commonly known as:  PLAVIX  Take 75 mg by mouth daily. Filled 12-30-15. For 30 days per pt's pharmacy     ENTRESTO 24-26 MG  Generic  drug:  sacubitril-valsartan  Take 1 tablet by mouth 2 (two) times daily.     fluticasone 50 MCG/ACT nasal spray  Commonly known as:  FLONASE  Place 2 sprays into both nostrils daily.     furosemide 40 MG tablet  Commonly known as:  LASIX  Take 1 tablet (40 mg total) by mouth daily.     isosorbide mononitrate 30 MG 24 hr tablet  Commonly known as:  IMDUR  Take 1 tablet (30 mg total) by mouth daily.     nitroGLYCERIN 0.4 MG SL tablet  Commonly known as:  NITROSTAT  Place 0.4 mg under the tongue every 5 (five) minutes as needed for chest pain.     pantoprazole 40 MG tablet  Commonly known as:  PROTONIX  Take 1 tablet (40 mg total) by mouth daily.  Start taking on:  01/11/2016     spironolactone 25 MG tablet  Commonly known as:  ALDACTONE  Take 12.5 mg by mouth daily.     traZODone 50 MG tablet  Commonly known as:  DESYREL  Take 0.5-1 tablets (25-50 mg total) by mouth at bedtime as needed for sleep.           Follow-up Information    Follow up with Ambrose Finland, NP. Schedule an appointment as soon as possible for a visit in 1 month.   Specialty:  Internal Medicine   Contact information:   [address] [place] [address] [phone]       Follow up with Rinaldo Cloud, MD. Schedule an appointment as soon as possible for a visit in 2 weeks.   Specialty:  Cardiology   Contact information:   [address] [place] [address] [phone]       Signed: Ricki Rodriguez 01/10/2016, 2:58 PM

## 2016-01-11 LAB — ECHOCARDIOGRAM COMPLETE
HEIGHTINCHES: 68 in
WEIGHTICAEL: 3313.6 [oz_av]

## 2016-01-13 ENCOUNTER — Encounter (HOSPITAL_COMMUNITY): Payer: Medicaid Other

## 2016-02-11 ENCOUNTER — Emergency Department (HOSPITAL_COMMUNITY)
Admission: EM | Admit: 2016-02-11 | Discharge: 2016-02-11 | Disposition: A | Discharge status: Home / Self Care | Payer: Medicaid Other | Attending: Emergency Medicine | Admitting: Emergency Medicine

## 2016-02-11 ENCOUNTER — Encounter (HOSPITAL_COMMUNITY): Payer: Self-pay | Admitting: Emergency Medicine

## 2016-02-11 DIAGNOSIS — M542 Cervicalgia: Secondary | ICD-10-CM | POA: Insufficient documentation

## 2016-02-11 DIAGNOSIS — I25119 Atherosclerotic heart disease of native coronary artery with unspecified angina pectoris: Secondary | ICD-10-CM | POA: Insufficient documentation

## 2016-02-11 DIAGNOSIS — G8929 Other chronic pain: Secondary | ICD-10-CM | POA: Diagnosis not present

## 2016-02-11 DIAGNOSIS — R51 Headache: Secondary | ICD-10-CM | POA: Diagnosis not present

## 2016-02-11 DIAGNOSIS — I252 Old myocardial infarction: Secondary | ICD-10-CM | POA: Diagnosis not present

## 2016-02-11 DIAGNOSIS — J45909 Unspecified asthma, uncomplicated: Secondary | ICD-10-CM | POA: Diagnosis not present

## 2016-02-11 NOTE — ED Notes (Signed)
Pt reports chronic neck pain which is not well controlled since last Friday. Pt reports the pain has now started to move up his neck and into head causing a worsening headache. Pt alert x4.

## 2016-02-11 NOTE — ED Notes (Signed)
Pt lwbs.  Stated they were not waiting any longer

## 2016-02-18 ENCOUNTER — Other Ambulatory Visit: Payer: Self-pay | Admitting: Internal Medicine

## 2016-03-04 ENCOUNTER — Other Ambulatory Visit: Payer: Self-pay | Admitting: Internal Medicine

## 2016-05-28 ENCOUNTER — Emergency Department (HOSPITAL_COMMUNITY): Payer: Medicaid Other

## 2016-05-28 ENCOUNTER — Encounter (HOSPITAL_COMMUNITY): Payer: Self-pay

## 2016-05-28 ENCOUNTER — Emergency Department (HOSPITAL_COMMUNITY)
Admission: EM | Admit: 2016-05-28 | Discharge: 2016-05-28 | Disposition: A | Discharge status: Home / Self Care | Payer: Medicaid Other | Attending: Emergency Medicine | Admitting: Emergency Medicine

## 2016-05-28 DIAGNOSIS — Z951 Presence of aortocoronary bypass graft: Secondary | ICD-10-CM | POA: Insufficient documentation

## 2016-05-28 DIAGNOSIS — S43004A Unspecified dislocation of right shoulder joint, initial encounter: Secondary | ICD-10-CM | POA: Insufficient documentation

## 2016-05-28 DIAGNOSIS — Y999 Unspecified external cause status: Secondary | ICD-10-CM | POA: Insufficient documentation

## 2016-05-28 DIAGNOSIS — Y92009 Unspecified place in unspecified non-institutional (private) residence as the place of occurrence of the external cause: Secondary | ICD-10-CM | POA: Diagnosis not present

## 2016-05-28 DIAGNOSIS — Z7902 Long term (current) use of antithrombotics/antiplatelets: Secondary | ICD-10-CM | POA: Insufficient documentation

## 2016-05-28 DIAGNOSIS — I10 Essential (primary) hypertension: Secondary | ICD-10-CM | POA: Insufficient documentation

## 2016-05-28 DIAGNOSIS — I252 Old myocardial infarction: Secondary | ICD-10-CM | POA: Insufficient documentation

## 2016-05-28 DIAGNOSIS — I25110 Atherosclerotic heart disease of native coronary artery with unstable angina pectoris: Secondary | ICD-10-CM | POA: Insufficient documentation

## 2016-05-28 DIAGNOSIS — Y939 Activity, unspecified: Secondary | ICD-10-CM | POA: Insufficient documentation

## 2016-05-28 DIAGNOSIS — W06XXXA Fall from bed, initial encounter: Secondary | ICD-10-CM | POA: Insufficient documentation

## 2016-05-28 DIAGNOSIS — J45909 Unspecified asthma, uncomplicated: Secondary | ICD-10-CM | POA: Diagnosis not present

## 2016-05-28 DIAGNOSIS — Z87891 Personal history of nicotine dependence: Secondary | ICD-10-CM | POA: Diagnosis not present

## 2016-05-28 DIAGNOSIS — S4991XA Unspecified injury of right shoulder and upper arm, initial encounter: Secondary | ICD-10-CM | POA: Diagnosis present

## 2016-05-28 DIAGNOSIS — Z955 Presence of coronary angioplasty implant and graft: Secondary | ICD-10-CM | POA: Insufficient documentation

## 2016-05-28 MED ORDER — HYDROMORPHONE HCL 1 MG/ML IJ SOLN
0.5000 mg | Freq: Once | INTRAMUSCULAR | Status: AC
  Administered 2016-05-28: 0.5 mg via INTRAMUSCULAR
  Filled 2016-05-28: qty 1

## 2016-05-28 NOTE — ED Notes (Signed)
Patient discharged from the continuous pulse oximetry and blood pressure cuff; patient awaiting to be discharged home

## 2016-05-28 NOTE — ED Triage Notes (Signed)
Patient here with right shoulder pain after falling out of bed this am, arrived with sling to same. Has had pins to right shoulder in past

## 2016-05-28 NOTE — ED Provider Notes (Signed)
[facility]-EMERGENCY DEPT Provider Note   CSN: [id number] Arrival date & time: 05/28/16  1191  First Provider Contact:  First MD Initiated Contact with Patient 05/28/16 1001        History   Chief Complaint Chief Complaint  Patient presents with  . Shoulder Injury    HPI Peter WAYNICK Sr. is a 65 y.o. male.  The history is provided by the patient.  Shoulder Injury  This is a new problem. Pertinent negatives include no chest pain, no abdominal pain and no headaches.  Patient presents with acute on chronic right shoulder pain. Reportedly fell out of bed last night. Broke the fall with his right hand. Has had chronic pain in his right shoulder does have previous surgeries there. Reportedly needs a likely replacement but Cardiologic clearance was an issue. Has had previous injections that have not really helped. Also has some mild pain in his neck. Rates the pain in his neck would likely been there when I press before. States that the shoulder popped out and then he was able to pop it back in place. No numbness or weakness. No loss conscious. He does go to the pain clinic. No other injury.    Past Medical History:  Diagnosis Date  . Anginal pain (HCC)   . Arthritis    OSTEO  . Asthma   . Coronary artery disease    DES proximal LAD  . GERD (gastroesophageal reflux disease)   . Gout   . HTN (hypertension)   . Hyperlipemia   . MI (mitral incompetence)    anterolateral MI October 2014  . Myocardial infarction (HCC)   . Shortness of breath     Patient Active Problem List   Diagnosis Date Noted  . Acute coronary syndrome (HCC) 01/09/2016  . Chest pain 01/09/2016  . Hyperlipidemia 05/29/2014  . Cellulitis 05/18/2014  . 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 At risk for sudden cardiac death, hx MI, EF 25%, Lt main disease 04/28/2014  . NSVT (nonsustained ventricular tachycardia) (HCC) 04/28/2014  . Unstable angina (HCC) 04/24/2014  . Coronary atherosclerosis of native coronary artery  11/29/2013  . HTN (hypertension) 11/29/2013  . GERD (gastroesophageal reflux disease) 11/29/2013  . 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 Cardiomyopathy, ischemic 11/29/2013    Past Surgical History:  Procedure Laterality Date  . BILATERAL SHOULDER ARTHROSCOPY    . CORONARY ARTERY BYPASS GRAFT N/A 05/01/2014   Procedure: CORONARY ARTERY BYPASS GRAFTING (CABG);  Surgeon: Alleen Borne, MD;  Location: [facility] OR;  Service: Open Heart Surgery;  Laterality: N/A;  Times 5 using left internal mammary artery and endoscopically harvested right saphenous vein  . CORONARY STENT PLACEMENT  2000, 2014  . INTRAOPERATIVE TRANSESOPHAGEAL ECHOCARDIOGRAM N/A 05/01/2014   Procedure: INTRAOPERATIVE TRANSESOPHAGEAL ECHOCARDIOGRAM;  Surgeon: Alleen Borne, MD;  Location: [facility] OR;  Service: Open Heart Surgery;  Laterality: N/A;  . LEFT HEART CATHETERIZATION WITH CORONARY ANGIOGRAM N/A 07/29/2013   Procedure: LEFT HEART CATHETERIZATION WITH CORONARY ANGIOGRAM;  Surgeon: Robynn Pane, MD;  Location: [facility] CATH LAB;  Service: Cardiovascular;  Laterality: N/A;  . LEFT HEART CATHETERIZATION WITH CORONARY ANGIOGRAM N/A 04/24/2014   Procedure: LEFT HEART CATHETERIZATION WITH CORONARY ANGIOGRAM;  Surgeon: Kathleene Hazel, MD;  Location: [facility] CATH LAB;  Service: Cardiovascular;  Laterality: N/A;  . PERCUTANEOUS STENT INTERVENTION  07/29/2013   Procedure: PERCUTANEOUS STENT INTERVENTION;  Surgeon: Robynn Pane, MD;  Location: [facility] CATH LAB;  Service: Cardiovascular;;       Home Medications  Prior to Admission medications   Medication Sig Start Date End Date Taking? Authorizing Provider  albuterol (PROVENTIL) (2.5 MG/3ML) 0.083% nebulizer solution Take 3 mLs (2.5 mg total) by nebulization every 6 (six) hours as needed for wheezing or shortness of breath. 03/04/16  Yes Quentin Angst, MD  ALPRAZolam Prudy Feeler) 0.5 MG tablet Take 1 tablet (0.5 mg total) by mouth at bedtime as needed for anxiety. 06/05/14  Yes Doris Cheadle, MD  carvedilol (COREG) 3.125 MG tablet Take 1 tablet (3.125 mg total) by mouth 2 (two) times daily with a meal. 06/03/14  Yes Kathleene Hazel, MD  clopidogrel (PLAVIX) 75 MG tablet Take 75 mg by mouth daily. Filled 12-30-15. For 30 days per pt's pharmacy   Yes Historical Provider, MD  esomeprazole (NEXIUM) 40 MG capsule Take 40 mg by mouth daily at 12 noon.   Yes Historical Provider, MD  fluticasone (FLONASE) 50 MCG/ACT nasal spray Place 2 sprays into both nostrils daily. 02/18/16  Yes Quentin Angst, MD  furosemide (LASIX) 40 MG tablet Take 1 tablet (40 mg total) by mouth daily. 03/30/15  Yes Tomasita Crumble, MD  isosorbide mononitrate (IMDUR) 30 MG 24 hr tablet Take 1 tablet (30 mg total) by mouth daily. 01/10/16  Yes Orpah Cobb, MD  isosorbide mononitrate (IMDUR) 30 MG 24 hr tablet Take 30 mg by mouth daily.   Yes Historical Provider, MD  Omeprazole-Sodium Bicarbonate (ZEGERID) 20-1100 MG CAPS capsule Take 1 capsule by mouth daily before breakfast.   Yes Historical Provider, MD  oxyCODONE-acetaminophen (PERCOCET) 10-325 MG tablet Take 1 tablet by mouth every 4 (four) hours as needed for pain.   Yes Historical Provider, MD  pantoprazole (PROTONIX) 40 MG tablet Take 1 tablet (40 mg total) by mouth daily. 01/11/16  Yes Orpah Cobb, MD  traZODone (DESYREL) 50 MG tablet Take 0.5-1 tablets (25-50 mg total) by mouth at bedtime as needed for sleep. 12/03/15  Yes Ambrose Finland, NP  atorvastatin (LIPITOR) 20 MG tablet Take 1 tablet (20 mg total) by mouth daily. 02/18/16   Quentin Angst, MD  nitroGLYCERIN (NITROSTAT) 0.4 MG SL tablet Place 0.4 mg under the tongue every 5 (five) minutes as needed for chest pain.    Historical Provider, MD  sacubitril-valsartan (ENTRESTO) 24-26 MG Take 1 tablet by mouth 2 (two) times daily.    Historical Provider, MD  spironolactone (ALDACTONE) 25 MG tablet Take 12.5 mg by mouth daily.    Historical Provider, MD    Family History Family History  Problem  Relation Age of Onset  . Cancer Mother   . Stroke Mother   . CAD Father   . Heart attack Father 79  . Heart attack Brother 44    x 3  . Cancer Sister     Social History Social History  Substance Use Topics  . Smoking status: Former Smoker    Packs/day: 0.50    Years: 54.00    Types: Cigarettes  . Smokeless tobacco: Former Neurosurgeon    Types: Chew  . Alcohol use No     Comment: former     Allergies   Codeine   Review of Systems Review of Systems  Constitutional: Negative for appetite change.  HENT: Negative for sore throat.   Respiratory: Negative for chest tightness.   Cardiovascular: Negative for chest pain.  Gastrointestinal: Negative for abdominal pain.  Musculoskeletal: Positive for back pain. Negative for neck pain.       Right shoulder pain.  Neurological: Negative for weakness, numbness  and headaches.     Physical Exam Updated Vital Signs BP 123/84   Pulse 72   Temp 97.9 F (36.6 C) (Oral)   Resp 16   SpO2 94%   Physical Exam  Constitutional: He appears well-developed.  HENT:  Head: Atraumatic.  Neck: Normal range of motion. Neck supple.  Cardiovascular: Normal rate.   Pulmonary/Chest: Effort normal.  Musculoskeletal:  Some tenderness over right shoulder and right superior trapezius. No cervical spine tenderness. Good range of motion. Sensation intact over right hand and right axillary distribution. No tenderness over elbow or wrist. Right arm is held in a sling.  Neurological: He is alert.  Skin: Skin is warm. Capillary refill takes less than 2 seconds.     ED Treatments / Results  Labs (all labs ordered are listed, but only abnormal results are displayed) Labs Reviewed - No data to display  EKG  EKG Interpretation None       Radiology Dg Shoulder Right  Result Date: 05/28/2016 CLINICAL DATA:  Right shoulder pain after fall last night. EXAM: RIGHT SHOULDER - 2+ VIEW COMPARISON:  None. FINDINGS: No fracture or dislocation in the right  shoulder. Mild right acromioclavicular joint osteoarthritis. Moderate to severe right glenohumeral joint osteoarthritis. No aggressive appearing focal osseous lesion. A fractured screw is seen overlying the right inferior glenoid, unchanged from 03/29/2015 chest radiograph. Partially visualized intact appearing sternotomy wires. IMPRESSION: 1. No right shoulder fracture or malalignment. 2. Prominent right glenohumeral joint osteoarthritis. 3. 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 Electronically Signed   By: Delbert Phenix M.D.   On: 05/28/2016 10:57    Procedures Procedures (including critical care time)  Medications Ordered in ED Medications  HYDROmorphone (DILAUDID) injection 0.5 mg (0.5 mg Intramuscular Given 05/28/16 1101)     Initial Impression / Assessment and Plan / ED Course  I have reviewed the triage vital signs and the nursing notes.  Pertinent labs & imaging results that were available during my care of the patient were reviewed by me and considered in my medical decision making (see chart for details).  Clinical Course    Patient with right shoulder pain. Likely was dislocated at home as patient states it was out and then had a pop back in. Has swelling at home. Has orthopedic follow-up. Will discharge home. Get seen at a pain clinic.  Final Clinical Impressions(s) / ED Diagnoses   Final diagnoses:  Shoulder dislocation, right, initial encounter    New Prescriptions New Prescriptions   No medications on file     Benjiman Core, MD 05/28/16 1210

## 2016-05-28 NOTE — ED Notes (Signed)
Brought patient back to room; placed on continuous pulse oximetry and blood pressure cuff

## 2016-06-01 ENCOUNTER — Emergency Department (HOSPITAL_COMMUNITY)
Admission: EM | Admit: 2016-06-01 | Discharge: 2016-06-01 | Disposition: A | Discharge status: Home / Self Care | Payer: Medicaid Other | Attending: Emergency Medicine | Admitting: Emergency Medicine

## 2016-06-01 ENCOUNTER — Encounter (HOSPITAL_COMMUNITY): Payer: Self-pay | Admitting: *Deleted

## 2016-06-01 DIAGNOSIS — Y929 Unspecified place or not applicable: Secondary | ICD-10-CM | POA: Insufficient documentation

## 2016-06-01 DIAGNOSIS — I10 Essential (primary) hypertension: Secondary | ICD-10-CM | POA: Diagnosis not present

## 2016-06-01 DIAGNOSIS — X58XXXA Exposure to other specified factors, initial encounter: Secondary | ICD-10-CM | POA: Insufficient documentation

## 2016-06-01 DIAGNOSIS — T162XXA Foreign body in left ear, initial encounter: Secondary | ICD-10-CM | POA: Diagnosis not present

## 2016-06-01 DIAGNOSIS — J45909 Unspecified asthma, uncomplicated: Secondary | ICD-10-CM | POA: Diagnosis not present

## 2016-06-01 DIAGNOSIS — I252 Old myocardial infarction: Secondary | ICD-10-CM | POA: Diagnosis not present

## 2016-06-01 DIAGNOSIS — I2510 Atherosclerotic heart disease of native coronary artery without angina pectoris: Secondary | ICD-10-CM | POA: Insufficient documentation

## 2016-06-01 DIAGNOSIS — Z955 Presence of coronary angioplasty implant and graft: Secondary | ICD-10-CM | POA: Insufficient documentation

## 2016-06-01 DIAGNOSIS — Z87891 Personal history of nicotine dependence: Secondary | ICD-10-CM | POA: Diagnosis not present

## 2016-06-01 DIAGNOSIS — Y999 Unspecified external cause status: Secondary | ICD-10-CM | POA: Diagnosis not present

## 2016-06-01 DIAGNOSIS — Z951 Presence of aortocoronary bypass graft: Secondary | ICD-10-CM | POA: Diagnosis not present

## 2016-06-01 DIAGNOSIS — Z79899 Other long term (current) drug therapy: Secondary | ICD-10-CM | POA: Diagnosis not present

## 2016-06-01 DIAGNOSIS — Y9389 Activity, other specified: Secondary | ICD-10-CM | POA: Diagnosis not present

## 2016-06-01 MED ORDER — OFLOXACIN 0.3 % OT SOLN: 5.0000 [drp] | Freq: Two times a day (BID) | OTIC | 0 refills | Status: DC

## 2016-06-01 NOTE — Discharge Instructions (Signed)
Use ear drops as directed for one week. Follow up with Dr. Jearld Fenton as needed for Ear, Nose, and Throat follow up evaluation.

## 2016-06-01 NOTE — ED Provider Notes (Signed)
[facility]-EMER[facility]ENCY DEPT Provider Note   CSN: [id number] Arrival date & time: 06/01/16  0754     History   Chief Complaint Chief Complaint  Patient presents with  . Foreign Body in Ear   HPI  Peter Booker. is an 65 y.o. male who presents to the ED for evaluation of a foreign body to his left ear. He states he was cleaning his ears with old q-tips this morning when the cotton end of one came off and got stuck in his left ear canal. He states the wooden stick itself did not break and he does not think he punctured his eardrum. Denies ear pain but states his ear is starting to feel "full." He did not try to remove the cotton himself. Denies tinnitus or dizziness.  Past Medical History:  Diagnosis Date  . Anginal pain (HCC)   . Arthritis    OSTEO  . Asthma   . Coronary artery disease    DES proximal LAD  . GERD (gastroesophageal reflux disease)   . Gout   . HTN (hypertension)   . Hyperlipemia   . MI (mitral incompetence)    anterolateral MI October 2014  . Myocardial infarction (HCC)   . Shortness of breath    Past Surgical History:  Procedure Laterality Date  . BILATERAL SHOULDER ARTHROSCOPY    . CORONARY ARTERY BYPASS GRAFT N/A 05/01/2014   Procedure: CORONARY ARTERY BYPASS GRAFTING (CABG);  Surgeon: Alleen Borne, MD;  Location: [facility] OR;  Service: Open Heart Surgery;  Laterality: N/A;  Times 5 using left internal mammary artery and endoscopically harvested right saphenous vein  . CORONARY STENT PLACEMENT  2000, 2014  . INTRAOPERATIVE TRANSESOPHAGEAL ECHOCARDIOGRAM N/A 05/01/2014   Procedure: INTRAOPERATIVE TRANSESOPHAGEAL ECHOCARDIOGRAM;  Surgeon: Alleen Borne, MD;  Location: [facility] OR;  Service: Open Heart Surgery;  Laterality: N/A;  . LEFT HEART CATHETERIZATION WITH CORONARY ANGIOGRAM N/A 07/29/2013   Procedure: LEFT HEART CATHETERIZATION WITH CORONARY ANGIOGRAM;  Surgeon: Robynn Pane, MD;  Location: [facility] CATH LAB;  Service: Cardiovascular;  Laterality: N/A;  . LEFT  HEART CATHETERIZATION WITH CORONARY ANGIOGRAM N/A 04/24/2014   Procedure: LEFT HEART CATHETERIZATION WITH CORONARY ANGIOGRAM;  Surgeon: Kathleene Hazel, MD;  Location[facility] CATH LAB;  Service: Cardiovascular;  Laterality: N/A;  . PERCUTANEOUS STENT INTERVENTION  07/29/2013   Procedure: PERCUTANEOUS STENT INTERVENTION;  Surgeon: Robynn Pane, MD;  Location: [facility] CATH LAB;  Service: Cardiovascular;;      Home Medications    Prior to Admission medications   Medication Sig Start Date End Date Taking? Authorizing Provider  albuterol (PROVENTIL) (2.5 MG/3ML) 0.083% nebulizer solution Take 3 mLs (2.5 mg total) by nebulization every 6 (six) hours as needed for wheezing or shortness of breath. 03/04/16   Quentin Angst, MD  ALPRAZolam Prudy Feeler) 0.5 MG tablet Take 1 tablet (0.5 mg total) by mouth at bedtime as needed for anxiety. 06/05/14   Doris Cheadle, MD  atorvastatin (LIPITOR) 20 MG tablet Take 1 tablet (20 mg total) by mouth daily. 02/18/16   Quentin Angst, MD  carvedilol (COREG) 3.125 MG tablet Take 1 tablet (3.125 mg total) by mouth 2 (two) times daily with a meal. 06/03/14   Kathleene Hazel, MD  clopidogrel (PLAVIX) 75 MG tablet Take 75 mg by mouth daily. Filled 12-30-15. For 30 days per pt's pharmacy    Historical Provider, MD  esomeprazole (NEXIUM) 40 MG capsule Take 40 mg by mouth daily at 12 noon.    Historical Provider, MD  fluticasone (FLONASE) 50 MCG/ACT nasal spray Place 2 sprays into both nostrils daily. 02/18/16   Quentin Angst, MD  furosemide (LASIX) 40 MG tablet Take 1 tablet (40 mg total) by mouth daily. 03/30/15   Tomasita Crumble, MD  isosorbide mononitrate (IMDUR) 30 MG 24 hr tablet Take 1 tablet (30 mg total) by mouth daily. 01/10/16   Orpah Cobb, MD  isosorbide mononitrate (IMDUR) 30 MG 24 hr tablet Take 30 mg by mouth daily.    Historical Provider, MD  nitroGLYCERIN (NITROSTAT) 0.4 MG SL tablet Place 0.4 mg under the tongue every 5 (five) minutes as needed for  chest pain.    Historical Provider, MD  Omeprazole-Sodium Bicarbonate (ZEGERID) 20-1100 MG CAPS capsule Take 1 capsule by mouth daily before breakfast.    Historical Provider, MD  oxyCODONE-acetaminophen (PERCOCET) 10-325 MG tablet Take 1 tablet by mouth every 4 (four) hours as needed for pain.    Historical Provider, MD  pantoprazole (PROTONIX) 40 MG tablet Take 1 tablet (40 mg total) by mouth daily. 01/11/16   Orpah Cobb, MD  sacubitril-valsartan (ENTRESTO) 24-26 MG Take 1 tablet by mouth 2 (two) times daily.    Historical Provider, MD  spironolactone (ALDACTONE) 25 MG tablet Take 12.5 mg by mouth daily.    Historical Provider, MD  traZODone (DESYREL) 50 MG tablet Take 0.5-1 tablets (25-50 mg total) by mouth at bedtime as needed for sleep. 12/03/15   Ambrose Finland, NP    Family History Family History  Problem Relation Age of Onset  . Cancer Mother   . Stroke Mother   . CAD Father   . Heart attack Father 18  . Heart attack Brother 44    x 3  . Cancer Sister     Social History Social History  Substance Use Topics  . Smoking status: Former Smoker    Packs/day: 0.50    Years: 54.00    Types: Cigarettes  . Smokeless tobacco: Former Neurosurgeon    Types: Chew  . Alcohol use No     Comment: former     Allergies   Codeine  Review of Systems Review of Systems 10 Systems reviewed and are negative for acute change except as noted in the HPI.  Physical Exam Updated Vital Signs BP 118/74 (BP Location: Left Arm)   Pulse 71   Temp 97.4 F (36.3 C) (Oral)   Resp 12   Ht 5\' 8"  (1.727 m)   Wt 91.2 kg   SpO2 100%   BMI 30.56 kg/m   Physical Exam  Constitutional: He is oriented to person, place, and time. No distress.  HENT:  Head: Atraumatic.  Right Ear: External ear normal.  Left Ear: External ear normal.  Nose: Nose normal.  Bilateral external ears normal L ear without external ear or tragal tenderness. No mastoid tenderness or edema. Internal exam reveals large pice of  cotton in canal that is obstructing TM. Scant spot of blood visible on cotton. Canal otherwise without erythema or edema.  Eyes: Conjunctivae are normal. No scleral icterus.  Cardiovascular: Normal rate and regular rhythm.   Pulmonary/Chest: Effort normal. No respiratory distress.  Abdominal: He exhibits no distension.  Neurological: He is alert and oriented to person, place, and time.  Skin: Skin is warm and dry. He is not diaphoretic.  Psychiatric: He has a normal mood and affect. His behavior is normal.  Nursing note and vitals reviewed.    ED Treatments / Results  Labs (all labs ordered are listed, but only  abnormal results are displayed) Labs Reviewed - No data to display  EKG  EKG Interpretation None       Radiology No results found.  Procedures .Foreign Body Removal Date/Time: 06/01/2016 8:13 AM Performed by: Carlene CoriaSAM, Valor [place] Y Authorized by: Carlene CoriaSAM, Shamyia Grandpre Y  Consent: Verbal consent obtained. Risks and benefits: risks, benefits and alternatives were discussed Consent given by: patient Patient understanding: patient states understanding of the procedure being performed Patient consent: the patient's understanding of the procedure matches consent given Procedure consent: procedure consent matches procedure scheduled Patient identity confirmed: verbally with patient Body area: ear Location details: left ear Patient cooperative: yes Localization method: visualized and ENT speculum Removal mechanism: forceps Complexity: simple 1 objects recovered. Objects recovered: cotton end of Q-tip Post-procedure assessment: foreign body removed Patient tolerance: Patient tolerated the procedure well with no immediate complications   (including critical care time)  Medications Ordered in ED Medications - No data to display   Initial Impression / Assessment and Plan / ED Course  I have reviewed the triage vital signs and the nursing notes.  Pertinent labs & imaging results that  were available during my care of the patient were reviewed by me and considered in my medical decision making (see chart for details).  Clinical Course   Entire cotton swab was successfully extracted from L ear canal. Entire TM was visualized and there is no perforation noted. There is a small scratched area of canal with minimal surrounding erythema. Will cover with rx for ofloxacin drops. Discussed with pt to avoid using Q-tips to clean his ear canal. Info given for ENT follow up as needed. ER return precautions given.  Final Clinical Impressions(s) / ED Diagnoses   Final diagnoses:  Foreign body in left ear, initial encounter    New Prescriptions Discharge Medication List as of 06/01/2016  8:15 AM    START taking these medications   Details  ofloxacin (FLOXIN) 0.3 % otic solution Place 5 drops into the left ear 2 (two) times daily., Starting Wed 06/01/2016, Print         Carlene CoriaSerena Y Dangela How, PA-C 06/01/16 0900    Donnetta HutchingBrian Cook, MD 06/05/16 [phone]

## 2016-06-01 NOTE — ED Triage Notes (Signed)
Pt reports cleaning his ears this and qtip broke off in left ear.

## 2016-06-01 NOTE — ED Notes (Signed)
Declined W/C at D/C and was escorted to lobby by RN. 

## 2016-08-01 ENCOUNTER — Ambulatory Visit (INDEPENDENT_AMBULATORY_CARE_PROVIDER_SITE_OTHER): Payer: Medicaid Other | Admitting: Internal Medicine

## 2016-08-01 ENCOUNTER — Encounter: Payer: Self-pay | Admitting: Internal Medicine

## 2016-08-01 VITALS — BP 144/82 | HR 80 | Ht 68.0 in | Wt 214.4 lb

## 2016-08-01 DIAGNOSIS — I255 Ischemic cardiomyopathy: Secondary | ICD-10-CM | POA: Diagnosis not present

## 2016-08-01 DIAGNOSIS — F1010 Alcohol abuse, uncomplicated: Secondary | ICD-10-CM

## 2016-08-01 DIAGNOSIS — I519 Heart disease, unspecified: Secondary | ICD-10-CM

## 2016-08-01 NOTE — Patient Instructions (Signed)
Medication Instructions:  Your physician recommends that you continue on your current medications as directed. Please refer to the Current Medication list given to you today.  Labwork: None ordered.  Testing/Procedures: None ordered.  Follow-Up: Your physician recommends that you schedule a follow-up appointment as needed.   Any Other Special Instructions Will Be Listed Below (If Applicable).     If you need a refill on your cardiac medications before your next appointment, please call your pharmacy.   

## 2016-08-01 NOTE — Progress Notes (Signed)
Electrophysiology Office Note   Date:  08/01/2016   ID:  Peter Gullingodney D Esther Sr., DOB 03/05/1951, MRN [id number]  PCP:  Ambrose FinlandValerie A Keck, NP  Cardiologist:  Dr Sharyn LullHarwani Primary Electrophysiologist: Hillis RangeJames Aadith Raudenbush, MD    CC: CHF   History of Present Illness: Peter GullingRodney D Lacher Sr. is a 65 y.o. male who presents today for electrophysiology evaluation. He has CAD with prior PCI and CABG 2015.  He has an ischemic CM (EF 25-29%).  He has NYHA Class III symptoms.  He is primarily limited by pain in his R shoulder and his legs.  He has chronic pain and takes signficant pain medicines.  He drinks 2-3 beers per day.  He can walk about 1/2 way [place] the [place].  He has occasional chest discomfort.  + orthopnea  Today, he denies symptoms of palpitations,  lower extremity edema, presyncope, syncope, bleeding, or neurologic sequela. The patient is tolerating medications without difficulties and is otherwise without complaint today.    Past Medical History:  Diagnosis Date  . Arthritis    OSTEO  . Asthma   . Chronic systolic dysfunction of left ventricle   . Coronary artery disease    DES proximal LAD  . Diabetes (HCC)   . GERD (gastroesophageal reflux disease)   . Gout   . HTN (hypertension)   . Hyperlipemia   . Ischemic cardiomyopathy   . Myocardial infarction 07/2013   anterolateral MI  . Right shoulder pain    chronically on pain medicine   Past Surgical History:  Procedure Laterality Date  . BILATERAL SHOULDER ARTHROSCOPY    . CORONARY ARTERY BYPASS GRAFT N/A 05/01/2014   Procedure: CORONARY ARTERY BYPASS GRAFTING (CABG);  Surgeon: Alleen BorneBryan K Bartle, MD;  [facility];  Service: Open Heart Surgery;  Laterality: N/A;  Times 5 using left internal mammary artery and endoscopically harvested right saphenous vein  . CORONARY STENT PLACEMENT  2000, 2014  . INTRAOPERATIVE TRANSESOPHAGEAL ECHOCARDIOGRAM N/A 05/01/2014   Procedure: INTRAOPERATIVE TRANSESOPHAGEAL ECHOCARDIOGRAM;  Surgeon:  Alleen BorneBryan K Bartle, MD;  [facility];  Service: Open Heart Surgery;  Laterality: N/A;  . LEFT HEART CATHETERIZATION WITH CORONARY ANGIOGRAM N/A 07/29/2013   Procedure: LEFT HEART CATHETERIZATION WITH CORONARY ANGIOGRAM;  Surgeon: Robynn PaneMohan N Harwani, MD;  Location: [facility] CATH LAB;  Service: Cardiovascular;  Laterality: N/A;  . LEFT HEART CATHETERIZATION WITH CORONARY ANGIOGRAM N/A 04/24/2014   Procedure: LEFT HEART CATHETERIZATION WITH CORONARY ANGIOGRAM;  Surgeon: Kathleene Hazelhristopher D McAlhany, MD;  [facility];  Service: Cardiovascular;  Laterality: N/A;  . PERCUTANEOUS STENT INTERVENTION  07/29/2013   Procedure: PERCUTANEOUS STENT INTERVENTION;  Surgeon: Robynn PaneMohan N Harwani, MD;  Location: [facility] CATH LAB;  Service: Cardiovascular;;     Current Outpatient Prescriptions  Medication Sig Dispense Refill  . albuterol (PROVENTIL) (2.5 MG/3ML) 0.083% nebulizer solution Take 3 mLs (2.5 mg total) by nebulization every 6 (six) hours as needed for wheezing or shortness of breath. 90 mL 2  . ALPRAZolam (XANAX) 0.5 MG tablet Take 1 tablet (0.5 mg total) by mouth at bedtime as needed for anxiety. 20 tablet 0  . atorvastatin (LIPITOR) 20 MG tablet Take 1 tablet (20 mg total) by mouth daily. 90 tablet 0  . carvedilol (COREG) 3.125 MG tablet Take 1 tablet (3.125 mg total) by mouth 2 (two) times daily with a meal. 60 tablet 1  . clopidogrel (PLAVIX) 75 MG tablet Take 75 mg by mouth daily. Filled 12-30-15. For 30 days per pt's pharmacy    .  esomeprazole (NEXIUM) 40 MG capsule Take 40 mg by mouth daily at 12 noon.    . fluticasone (FLONASE) 50 MCG/ACT nasal spray Place 2 sprays into both nostrils daily. 16 g 2  . furosemide (LASIX) 40 MG tablet Take 1 tablet (40 mg total) by mouth daily. 5 tablet 0  . isosorbide mononitrate (IMDUR) 30 MG 24 hr tablet Take 1 tablet (30 mg total) by mouth daily. 30 tablet 1  . isosorbide mononitrate (IMDUR) 30 MG 24 hr tablet Take 30 mg by mouth daily.    . nitroGLYCERIN (NITROSTAT) 0.4 MG SL  tablet Place 0.4 mg under the tongue every 5 (five) minutes as needed for chest pain.    Marland Kitchen ofloxacin (FLOXIN) 0.3 % otic solution Place 5 drops into the left ear 2 (two) times daily. 5 mL 0  . Omeprazole-Sodium Bicarbonate (ZEGERID) 20-1100 MG CAPS capsule Take 1 capsule by mouth daily before breakfast.    . oxyCODONE-acetaminophen (PERCOCET) 10-325 MG tablet Take 1 tablet by mouth every 4 (four) hours as needed for pain.    . pantoprazole (PROTONIX) 40 MG tablet Take 1 tablet (40 mg total) by mouth daily. 30 tablet 1  . sacubitril-valsartan (ENTRESTO) 24-26 MG Take 1 tablet by mouth 2 (two) times daily.    Marland Kitchen spironolactone (ALDACTONE) 25 MG tablet Take 12.5 mg by mouth daily.    . traZODone (DESYREL) 50 MG tablet Take 0.5-1 tablets (25-50 mg total) by mouth at bedtime as needed for sleep. 30 tablet 1   No current facility-administered medications for this visit.     Allergies:   Codeine   Social History:  The patient  reports that he has quit smoking. His smoking use included Cigarettes. He has a 27.00 pack-year smoking history. He has quit using smokeless tobacco. His smokeless tobacco use included Chew. He reports that he drinks about 7.0 oz of alcohol per week . He reports that he does not use drugs.   Family History:  The patient's  family history includes CAD in his father; Cancer in his mother and sister; Heart attack (age of onset: 74) in his brother; Heart attack (age of onset: 86) in his father; Stroke in his mother.  He has had 7 brother with heart issues.  One brother had ICD infection.  ROS:  Please see the history of present illness.   All other systems are reviewed and negative.    PHYSICAL EXAM: VS:  BP (!) 144/82   Pulse 80   Ht 5\' 8"  (1.727 m)   Wt 214 lb 6.4 oz (97.3 kg)   SpO2 97%   BMI 32.60 kg/m  , BMI Body mass index is 32.6 kg/m. GEN: Well nourished, well developed, in no acute distress  HEENT: normal  Neck: no JVD, carotid bruits, or masses Cardiac: RRR; no  murmurs, rubs, or gallops,no edema  Respiratory:  clear to auscultation bilaterally, normal work of breathing GI: soft, nontender, nondistended, + BS MS: no deformity or atrophy, favors his R arm and shoulder Skin: warm and dry, multiple tatoos Neuro:  Strength and sensation are intact Psych: bizarre affect  EKG:  EKGs are reviewed and reveal RBBB with QRS 132 msec   Recent Labs: 12/03/2015: ALT 14 01/09/2016: B Natriuretic Peptide 192.5 01/10/2016: BUN 15; Creatinine, Ser 1.00; Hemoglobin 13.2; Platelets 183; Potassium 4.5; Sodium 138    Lipid Panel     Component Value Date/Time   CHOL 133 01/10/2016 0442   TRIG 205 (H) 01/10/2016 0442   HDL 40 (L)  01/10/2016 0442   CHOLHDL 3.3 01/10/2016 0442   VLDL 41 (H) 01/10/2016 0442   LDLCALC 52 01/10/2016 0442     Wt Readings from Last 3 Encounters:  08/01/16 214 lb 6.4 oz (97.3 kg)  06/01/16 201 lb (91.2 kg)  01/10/16 207 lb 1.6 oz (93.9 kg)      Other studies Reviewed: Additional studies/ records that were reviewed today include: 27 pages from Dr Annitta Jersey office, prior epic records, echo  Review of the above records today demonstrates: echo 06/16/16 reveals EF 25-29% with trace MR, global HK, normal RV function, moderate LA enlargement   ASSESSMENT AND PLAN:  The patient has an ischemic CM (EF 25%), NYHA Class II/III CHF, and CAD. He has RBBB with QRS < 150 msec.  He therefore is not a candidate for CRT.  At this time, he meets MADIT II/ SCD-HeFT criteria for ICD implantation for primary prevention of sudden death.  Risks, benefits, altern2atives to ICD implantation were discussed in detail with the patient today. The patient  understands that the risks include but are not limited to bleeding, infection, pneumothorax, perforation, tamponade, vascular damage, renal failure, MI, stroke, death, inappropriate shocks, and lead dislodgement and wishes to avoid ICD implantation.  He states that his brother had an ICD skin infection with  erosion of the device through the chest.  He would prefer to continue medical therapy at this time.  I think that given his preference this a good option.  I have also advised ETOh cessation.  Follow-up with Dr Sharyn Lull.  I will see as needed  Current medicines are reviewed at length with the patient today.   The patient does not have concerns regarding his medicines.  The following changes were made today:  none   Signed, Hillis Range, MD  08/01/2016 9:22 AM     [facility] [address] [place] [address] [phone] (office) [phone] (fax)

## 2016-08-15 ENCOUNTER — Other Ambulatory Visit: Payer: Self-pay | Admitting: Pharmacist

## 2016-08-15 MED ORDER — FLUTICASONE PROPIONATE 50 MCG/ACT NA SUSP: NASAL | 0 refills | Status: AC

## 2016-09-01 ENCOUNTER — Telehealth (INDEPENDENT_AMBULATORY_CARE_PROVIDER_SITE_OTHER): Payer: Self-pay | Admitting: Orthopaedic Surgery

## 2016-09-01 NOTE — Telephone Encounter (Signed)
Patient had shoulder injection 05/25/16.  Ok to make appt for another?

## 2016-09-01 NOTE — Telephone Encounter (Signed)
Pt Called back about message below  

## 2016-09-01 NOTE — Telephone Encounter (Signed)
Peter Booker called saying his shoulder is out of place and he's wondering if he's eligible to have another cortizone shot. Please give him a call regarding this.  Pt's ph# [phone] Thank you.

## 2016-09-01 NOTE — Telephone Encounter (Signed)
Needs to wait one more month. [facility] thanks

## 2016-09-02 NOTE — Telephone Encounter (Signed)
I called and left voicemail for patient advising that he needed to wait one month and could call and schedule appt with Dr. Ophelia Charter sometime around 09/30/16.

## 2016-09-12 ENCOUNTER — Ambulatory Visit: Payer: Medicaid Other | Admitting: Family Medicine

## 2016-09-15 ENCOUNTER — Other Ambulatory Visit: Payer: Self-pay | Admitting: Pharmacist

## 2016-09-19 ENCOUNTER — Ambulatory Visit: Payer: Medicaid Other | Admitting: Family Medicine

## 2016-09-21 ENCOUNTER — Ambulatory Visit: Payer: Medicaid Other | Attending: Family Medicine | Admitting: Family Medicine

## 2016-09-21 ENCOUNTER — Encounter: Payer: Self-pay | Admitting: Family Medicine

## 2016-09-21 ENCOUNTER — Other Ambulatory Visit: Payer: Self-pay | Admitting: Cardiology

## 2016-09-21 VITALS — BP 105/65 | HR 63 | Temp 97.6°F | Ht 68.0 in | Wt 216.0 lb

## 2016-09-21 DIAGNOSIS — J4540 Moderate persistent asthma, uncomplicated: Secondary | ICD-10-CM | POA: Insufficient documentation

## 2016-09-21 DIAGNOSIS — Z951 Presence of aortocoronary bypass graft: Secondary | ICD-10-CM | POA: Diagnosis not present

## 2016-09-21 DIAGNOSIS — E785 Hyperlipidemia, unspecified: Secondary | ICD-10-CM | POA: Insufficient documentation

## 2016-09-21 DIAGNOSIS — I255 Ischemic cardiomyopathy: Secondary | ICD-10-CM | POA: Insufficient documentation

## 2016-09-21 DIAGNOSIS — I10 Essential (primary) hypertension: Secondary | ICD-10-CM

## 2016-09-21 DIAGNOSIS — I509 Heart failure, unspecified: Secondary | ICD-10-CM | POA: Diagnosis not present

## 2016-09-21 DIAGNOSIS — K219 Gastro-esophageal reflux disease without esophagitis: Secondary | ICD-10-CM | POA: Insufficient documentation

## 2016-09-21 DIAGNOSIS — M109 Gout, unspecified: Secondary | ICD-10-CM | POA: Diagnosis not present

## 2016-09-21 DIAGNOSIS — R079 Chest pain, unspecified: Secondary | ICD-10-CM

## 2016-09-21 DIAGNOSIS — I2510 Atherosclerotic heart disease of native coronary artery without angina pectoris: Secondary | ICD-10-CM | POA: Insufficient documentation

## 2016-09-21 DIAGNOSIS — Z79899 Other long term (current) drug therapy: Secondary | ICD-10-CM | POA: Diagnosis not present

## 2016-09-21 DIAGNOSIS — R7303 Prediabetes: Secondary | ICD-10-CM | POA: Diagnosis not present

## 2016-09-21 DIAGNOSIS — J45909 Unspecified asthma, uncomplicated: Secondary | ICD-10-CM | POA: Insufficient documentation

## 2016-09-21 DIAGNOSIS — I110 Hypertensive heart disease with heart failure: Secondary | ICD-10-CM | POA: Insufficient documentation

## 2016-09-21 DIAGNOSIS — I252 Old myocardial infarction: Secondary | ICD-10-CM | POA: Insufficient documentation

## 2016-09-21 LAB — POCT GLYCOSYLATED HEMOGLOBIN (HGB A1C): Hemoglobin A1C: 5.5

## 2016-09-21 MED ORDER — FLUTICASONE-SALMETEROL 250-50 MCG/DOSE IN AEPB: 1.0000 | INHALATION_SPRAY | Freq: Two times a day (BID) | RESPIRATORY_TRACT | 3 refills | Status: DC

## 2016-09-21 NOTE — Patient Instructions (Signed)

## 2016-09-21 NOTE — Progress Notes (Signed)
Subjective:  Patient ID: Peter Gulling Sr., male    DOB: 1950-12-08  Age: 65 y.o. MRN: [id number]  CC: Hypertension; Shortness of Breath; and Congestive Heart Failure   HPI Peter MILIC Sr. is a 65 year old male with a history of ischemic cardiomyopathy (EF 25-29% from 2-D echo of 05/2016), coronary artery disease (status PCI, status post CABG), hypertension, asthma who comes into the clinic to establish care with me.  He was seen by the electrophysiology cardiologist for evaluation of ICD placement which the Patient declined. He does have shortness of breath with mild-to-moderate exertion, orthopnea but denies pedal edema or chest pains. 2-D echo from 05/2016 reveals EF of 25-25%, trace Mild, global hypokinesis, moderate atrial enlargement.  He does have a history of asthma and would like to be placed back on Advair as his current MDI does not help with his asthma symptoms of wheezing and coughing.  He states the next with his cardiologist for insomnia.  Past Medical History:  Diagnosis Date  . Arthritis    OSTEO  . Asthma   . Chronic systolic dysfunction of left ventricle   . Coronary artery disease    DES proximal LAD  . Diabetes (HCC)   . GERD (gastroesophageal reflux disease)   . Gout   . HTN (hypertension)   . Hyperlipemia   . Ischemic cardiomyopathy   . Myocardial infarction 07/2013   anterolateral MI  . Right shoulder pain    chronically on pain medicine    Past Surgical History:  Procedure Laterality Date  . BILATERAL SHOULDER ARTHROSCOPY    . CORONARY ARTERY BYPASS GRAFT N/A 05/01/2014   Procedure: CORONARY ARTERY BYPASS GRAFTING (CABG);  Surgeon: Alleen Borne, MD;  Location: [facility] OR;  Service: Open Heart Surgery;  Laterality: N/A;  Times 5 using left internal mammary artery and endoscopically harvested right saphenous vein  . CORONARY STENT PLACEMENT  2000, 2014  . INTRAOPERATIVE TRANSESOPHAGEAL ECHOCARDIOGRAM N/A 05/01/2014   Procedure: INTRAOPERATIVE  TRANSESOPHAGEAL ECHOCARDIOGRAM;  Surgeon: Alleen Borne, MD;  Location: [facility] OR;  Service: Open Heart Surgery;  Laterality: N/A;  . LEFT HEART CATHETERIZATION WITH CORONARY ANGIOGRAM N/A 07/29/2013   Procedure: LEFT HEART CATHETERIZATION WITH CORONARY ANGIOGRAM;  Surgeon: Robynn Pane, MD;  Location: [facility] CATH LAB;  Service: Cardiovascular;  Laterality: N/A;  . LEFT HEART CATHETERIZATION WITH CORONARY ANGIOGRAM N/A 04/24/2014   Procedure: LEFT HEART CATHETERIZATION WITH CORONARY ANGIOGRAM;  Surgeon: Kathleene Hazel, MD;  Location: [facility] CATH LAB;  Service: Cardiovascular;  Laterality: N/A;  . PERCUTANEOUS STENT INTERVENTION  07/29/2013   Procedure: PERCUTANEOUS STENT INTERVENTION;  Surgeon: Robynn Pane, MD;  Location: [facility] CATH LAB;  Service: Cardiovascular;;    Allergies  Allergen Reactions  . Codeine Itching     Outpatient Medications Prior to Visit  Medication Sig Dispense Refill  . albuterol (PROVENTIL) (2.5 MG/3ML) 0.083% nebulizer solution Take 3 mLs (2.5 mg total) by nebulization every 6 (six) hours as needed for wheezing or shortness of breath. 90 mL 2  . ALPRAZolam (XANAX) 0.5 MG tablet Take 1 tablet (0.5 mg total) by mouth at bedtime as needed for anxiety. 20 tablet 0  . carvedilol (COREG) 3.125 MG tablet Take 1 tablet (3.125 mg total) by mouth 2 (two) times daily with a meal. 60 tablet 1  . clopidogrel (PLAVIX) 75 MG tablet Take 75 mg by mouth daily. Filled 12-30-15. For 30 days per pt's pharmacy    . esomeprazole (NEXIUM) 40 MG capsule Take 40 mg  by mouth daily at 12 noon.    . fluticasone (FLONASE) 50 MCG/ACT nasal spray Place 2 sprays into both nostrils daily. 16 g 0  . furosemide (LASIX) 40 MG tablet Take 1 tablet (40 mg total) by mouth daily. 5 tablet 0  . isosorbide mononitrate (IMDUR) 30 MG 24 hr tablet Take 1 tablet (30 mg total) by mouth daily. 30 tablet 1  . nitroGLYCERIN (NITROSTAT) 0.4 MG SL tablet Place 0.4 mg under the tongue every 5 (five) minutes as needed for  chest pain.    Marland Kitchen Omeprazole-Sodium Bicarbonate (ZEGERID) 20-1100 MG CAPS capsule Take 1 capsule by mouth daily before breakfast.    . oxyCODONE-acetaminophen (PERCOCET) 10-325 MG tablet Take 1 tablet by mouth every 4 (four) hours as needed for pain.    . pantoprazole (PROTONIX) 40 MG tablet Take 1 tablet (40 mg total) by mouth daily. 30 tablet 1  . sacubitril-valsartan (ENTRESTO) 24-26 MG Take 1 tablet by mouth 2 (two) times daily.    Marland Kitchen spironolactone (ALDACTONE) 25 MG tablet Take 12.5 mg by mouth daily.    Marland Kitchen ofloxacin (FLOXIN) 0.3 % otic solution Place 5 drops into the left ear 2 (two) times daily. 5 mL 0  . traZODone (DESYREL) 50 MG tablet Take 0.5-1 tablets (25-50 mg total) by mouth at bedtime as needed for sleep. 30 tablet 1  . atorvastatin (LIPITOR) 20 MG tablet Take 1 tablet (20 mg total) by mouth daily. (Patient not taking: Reported on 09/21/2016) 90 tablet 0  . isosorbide mononitrate (IMDUR) 30 MG 24 hr tablet Take 30 mg by mouth daily.     No facility-administered medications prior to visit.     ROS Review of Systems  Constitutional: Negative for activity change and appetite change.  HENT: Negative for sinus pressure and sore throat.   Eyes: Negative for visual disturbance.  Respiratory: Positive for cough and shortness of breath. Negative for chest tightness.   Cardiovascular: Negative for chest pain and leg swelling.  Gastrointestinal: Negative for abdominal distention, abdominal pain, constipation and diarrhea.  Endocrine: Negative.   Genitourinary: Negative for dysuria.  Musculoskeletal: Negative for joint swelling and myalgias.  Skin: Negative for rash.  Allergic/Immunologic: Negative.   Neurological: Negative for weakness, light-headedness and numbness.  Psychiatric/Behavioral: Negative for dysphoric mood and suicidal ideas.    Objective:  BP 105/65 (BP Location: Right Arm, Patient Position: Sitting, Cuff Size: Large)   Pulse 63   Temp 97.6 F (36.4 C) (Oral)   Ht 5'  8" (1.727 m)   Wt 216 lb (98 kg)   SpO2 97%   BMI 32.84 kg/m   BP/Weight 09/21/2016 08/01/2016 06/01/2016  Systolic BP 105 144 118  Diastolic BP 65 82 74  Wt. (Lbs) 216 214.4 201  BMI 32.84 32.6 30.56  Some encounter information is confidential and restricted. Go to Review Flowsheets activity to see all data.      Physical Exam  Constitutional: He is oriented to person, place, and time. He appears well-developed and well-nourished.  Cardiovascular: Normal rate, normal heart sounds and intact distal pulses.   No murmur heard. Pulmonary/Chest: Effort normal and breath sounds normal. He has no wheezes. He has no rales. He exhibits no tenderness.  Abdominal: Soft. Bowel sounds are normal. He exhibits no distension and no mass. There is no tenderness.  Musculoskeletal: Normal range of motion.  Neurological: He is alert and oriented to person, place, and time.  Skin: Skin is warm and dry.  Psychiatric: He has a normal mood and affect.  Lab Results  Component Value Date   HGBA1C 5.5 09/21/2016    CMP Latest Ref Rng & Units 01/10/2016 01/09/2016 12/03/2015  Glucose 65 - 99 mg/dL 478(G105(H) 956(O133(H) 130(Q101(H)  BUN 6 - 20 mg/dL 15 13 12   Creatinine 0.61 - 1.24 mg/dL 6.571.00 8.461.09 9.620.99  Sodium 135 - 145 mmol/L 138 137 140  Potassium 3.5 - 5.1 mmol/L 4.5 4.2 4.5  Chloride 101 - 111 mmol/L 107 105 101  CO2 22 - 32 mmol/L 19(L) 21(L) 26  Calcium 8.9 - 10.3 mg/dL 9.5(M8.6(L) 9.1 9.2  Total Protein 6.1 - 8.1 g/dL - - 7.1  Total Bilirubin 0.2 - 1.2 mg/dL - - 0.5  Alkaline Phos 40 - 115 U/L - - 75  AST 10 - 35 U/L - - 19  ALT 9 - 46 U/L - - 14     Assessment & Plan:   1. Moderate persistent asthma without complication Uncontrolled on albuterol MDI Also had Advair to regimen - Fluticasone-Salmeterol (ADVAIR DISKUS) 250-50 MCG/DOSE AEPB; Inhale 1 puff into the lungs 2 (two) times daily.  Dispense: 60 each; Refill: 3  2. Prediabetes A1c is 5.5 which is down from 6.1, 5 years ago Discussed  lifestyle modification to prevent development of diabetes - HgB A1c  3. S/P CABG x 5 Stable  4. Essential hypertension Controlled Scheduled for labs with cardiology.  5. Atherosclerosis of native coronary artery of native heart without angina pectoris Status post stent and CABG  6. Ischemic cardiomyopathy  EF 25-29% (from 2-D echo of 05/2016), NYHA III Declines ICD  Limit fluid intake to less than 2 L per day, daily weight checks  Meds ordered this encounter  Medications  . Fluticasone-Salmeterol (ADVAIR DISKUS) 250-50 MCG/DOSE AEPB    Sig: Inhale 1 puff into the lungs 2 (two) times daily.    Dispense:  60 each    Refill:  3    Follow-up: Return in about 3 months (around 12/20/2016) for Follow-up on asthma and prediabetes.   Jaclyn ShaggyEnobong Amao MD

## 2016-09-21 NOTE — Progress Notes (Signed)
Would like to placed "back" on advair

## 2016-09-22 ENCOUNTER — Telehealth: Payer: Self-pay | Admitting: *Deleted

## 2016-09-22 NOTE — Telephone Encounter (Signed)
Prior authorization for Advair Diskus approved via [employer]. Approval number: [id number]: approval dates: 09/22/16-09/22/17 Guy Franco, RN, BSN

## 2016-09-27 ENCOUNTER — Encounter (INDEPENDENT_AMBULATORY_CARE_PROVIDER_SITE_OTHER): Payer: Self-pay | Admitting: Orthopaedic Surgery

## 2016-09-27 ENCOUNTER — Ambulatory Visit (INDEPENDENT_AMBULATORY_CARE_PROVIDER_SITE_OTHER): Payer: Medicaid Other

## 2016-09-27 ENCOUNTER — Ambulatory Visit (INDEPENDENT_AMBULATORY_CARE_PROVIDER_SITE_OTHER): Payer: Medicaid Other | Admitting: Orthopaedic Surgery

## 2016-09-27 VITALS — BP 128/77 | HR 66 | Ht 68.0 in | Wt 215.0 lb

## 2016-09-27 DIAGNOSIS — M25511 Pain in right shoulder: Secondary | ICD-10-CM

## 2016-09-27 DIAGNOSIS — G8929 Other chronic pain: Secondary | ICD-10-CM

## 2016-09-27 DIAGNOSIS — M19011 Primary osteoarthritis, right shoulder: Secondary | ICD-10-CM | POA: Diagnosis not present

## 2016-09-27 MED ORDER — LIDOCAINE HCL 1 % IJ SOLN
3.0000 mL | INTRAMUSCULAR | Status: AC | PRN
  Administered 2016-09-27: 3 mL

## 2016-09-27 MED ORDER — METHYLPREDNISOLONE ACETATE 40 MG/ML IJ SUSP
40.0000 mg | INTRAMUSCULAR | Status: AC | PRN
  Administered 2016-09-27: 40 mg via INTRA_ARTICULAR

## 2016-09-27 MED ORDER — BUPIVACAINE HCL 0.25 % IJ SOLN
4.0000 mL | INTRAMUSCULAR | Status: AC | PRN
  Administered 2016-09-27: 4 mL via INTRA_ARTICULAR

## 2016-09-27 NOTE — Progress Notes (Signed)
Office Visit Note   Patient: Peter GullingRodney D Schraeder Sr.           Date of Birth: 07/30/1951           MRN: [id number] Visit Date: 09/27/2016              Requested by: Ambrose FinlandValerie A Keck, NP No address on file PCP: Jaclyn ShaggyEnobong, Amao, MD   Assessment & Plan: Visit Diagnoses:  1. Chronic right shoulder pain   2. Arthritis of right shoulder region     Plan: Patient requested repeat shoulder injection again today. From a posterior approach I performed a glenohumeral intra-articular Marcaine/Depo-Medrol injection. Tolerated well without complication. Patient reported immediate relief with Xylocaine in place. Advised patient that his shoulder is a very complex issue due to the degenerative changes that he has an surgical procedure that was done several years ago. Definitive treatment would be total shoulder replacement. We'll refer patient to Dr. Jackquline BoschJesse Chandler shoulder specialist with [facility]. Follow-up with Dr. Ophelia CharterYates prn.  Follow-Up Instructions: No Follow-up on file.   Orders:  Orders Placed This Encounter  Procedures  . Large Joint Injection/Arthrocentesis  . XR Shoulder Right   No orders of the defined types were placed in this encounter.     Procedures: Large Joint Inj Date/Time: 09/27/2016 4:41 PM Performed by: Naida SleightWENS, JAMES M Authorized by: Naida SleightWENS, JAMES M   Consent Given by:  Patient Indications:  Pain Location:  Shoulder Site:  R subacromial bursa Needle Size:  25 G Needle Length:  1.5 inches Approach:  Posterior Ultrasound Guidance: No   Fluoroscopic Guidance: No   Arthrogram: No   Medications:  3 mL lidocaine 1 %; 4 mL bupivacaine 0.25 %; 40 mg methylPREDNISolone acetate 40 MG/ML Aspiration Attempted: No   Patient tolerance:  Patient tolerated the procedure well with no immediate complications     Clinical Data: No additional findings.   Subjective: Chief Complaint  Patient presents with  . Right Shoulder - Pain    Peter Booker is here for right  shoulder pain, he had an injection on 05/25/16 that lasted about 4-5 weeks.  Can't sleep in certain positions due to pain, states that pain has worsened over last 2 weeks.   Feels like his shoulder is getting progressively worse with decreased range of motion with reaching overhead and internal rotation on his back. Minimal improvement with previous shoulder injections. States that he is wanted to proceed with scheduling surgery and states that he is scheduled for stress test in hopes of being cleared to have shoulder surgery. Review of Systems  Constitutional: Negative.   Musculoskeletal:       Right greater than left shoulder pain  Neurological: Negative.   Psychiatric/Behavioral: Negative.      Objective: Vital Signs: BP 128/77 (BP Location: Left Arm, Patient Position: Sitting)   Pulse 66   Ht 5\' 8"  (1.727 m)   Wt 215 lb (97.5 kg)   BMI 32.69 kg/m   Physical Exam  Constitutional: He is oriented to person, place, and time. No distress.  Eyes: EOM are normal. Pupils are equal, round, and reactive to light.  Pulmonary/Chest: Effort normal.  Musculoskeletal:  Exam her shoulder flexion/abduction to about 90-100 with marked discomfort. He is unable to internally rotate behind his back. Pain with supraspinatus resistance.  Neurological: He is alert and oriented to person, place, and time.  Psychiatric: He has a normal mood and affect.    Ortho Exam  Specialty Comments:  No specialty comments available.  Imaging: No results found.   PMFS History: Patient Active Problem List   Diagnosis Date Noted  . Asthma 09/21/2016  . Prediabetes 09/21/2016  . Acute coronary syndrome (HCC) 01/09/2016  . Chest pain 01/09/2016  . Hyperlipidemia 05/29/2014  . Cellulitis 05/18/2014  . 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 At risk for sudden cardiac death, hx MI, EF 25%, Lt main disease 04/28/2014  . NSVT (nonsustained ventricular tachycardia) (HCC) 04/28/2014  . Unstable angina (HCC) 04/24/2014  .  Coronary atherosclerosis of native coronary artery 11/29/2013  . HTN (hypertension) 11/29/2013  . GERD (gastroesophageal reflux disease) 11/29/2013  . 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 Cardiomyopathy, ischemic 11/29/2013   Past Medical History:  Diagnosis Date  . Arthritis    OSTEO  . Asthma   . Chronic systolic dysfunction of left ventricle   . Coronary artery disease    DES proximal LAD  . Diabetes (HCC)   . GERD (gastroesophageal reflux disease)   . Gout   . HTN (hypertension)   . Hyperlipemia   . Ischemic cardiomyopathy   . Myocardial infarction 07/2013   anterolateral MI  . Right shoulder pain    chronically on pain medicine    Family History  Problem Relation Age of Onset  . Cancer Mother   . Stroke Mother   . CAD Father   . Heart attack Father 33  . Heart attack Brother 44    x 3  . Cancer Sister     Past Surgical History:  Procedure Laterality Date  . BILATERAL SHOULDER ARTHROSCOPY    . CORONARY ARTERY BYPASS GRAFT N/A 05/01/2014   Procedure: CORONARY ARTERY BYPASS GRAFTING (CABG);  Surgeon: Alleen Borne, MD;  Location: [facility] OR;  Service: Open Heart Surgery;  Laterality: N/A;  Times 5 using left internal mammary artery and endoscopically harvested right saphenous vein  . CORONARY STENT PLACEMENT  2000, 2014  . INTRAOPERATIVE TRANSESOPHAGEAL ECHOCARDIOGRAM N/A 05/01/2014   Procedure: INTRAOPERATIVE TRANSESOPHAGEAL ECHOCARDIOGRAM;  Surgeon: Alleen Borne, MD;  Location: [facility] OR;  Service: Open Heart Surgery;  Laterality: N/A;  . LEFT HEART CATHETERIZATION WITH CORONARY ANGIOGRAM N/A 07/29/2013   Procedure: LEFT HEART CATHETERIZATION WITH CORONARY ANGIOGRAM;  Surgeon: Robynn Pane, MD;  Location: [facility] CATH LAB;  Service: Cardiovascular;  Laterality: N/A;  . LEFT HEART CATHETERIZATION WITH CORONARY ANGIOGRAM N/A 04/24/2014   Procedure: LEFT HEART CATHETERIZATION WITH CORONARY ANGIOGRAM;  Surgeon: Kathleene Hazel, MD;  Location: [facility] CATH LAB;  Service:  Cardiovascular;  Laterality: N/A;  . PERCUTANEOUS STENT INTERVENTION  07/29/2013   Procedure: PERCUTANEOUS STENT INTERVENTION;  Surgeon: Robynn Pane, MD;  Location: [facility] CATH LAB;  Service: Cardiovascular;;   Social History   Occupational History  . Disabled    Social History Main Topics  . Smoking status: Former Smoker    Packs/day: 0.50    Years: 54.00    Types: Cigarettes  . Smokeless tobacco: Former Neurosurgeon    Types: Chew  . Alcohol use 7.0 oz/week    14 Standard drinks or equivalent per week     Comment: 2-3 beers per day  . Drug use: No  . Sexual activity: Not on file

## 2016-09-28 ENCOUNTER — Ambulatory Visit (HOSPITAL_COMMUNITY): Admission: RE | Admit: 2016-09-28 | Payer: Medicaid Other | Source: Ambulatory Visit

## 2016-10-12 ENCOUNTER — Ambulatory Visit (HOSPITAL_COMMUNITY)
Admission: RE | Admit: 2016-10-12 | Discharge: 2016-10-12 | Disposition: A | Discharge status: Home / Self Care | Payer: Medicaid Other | Source: Ambulatory Visit | Attending: Cardiology | Admitting: Cardiology

## 2016-10-12 DIAGNOSIS — R079 Chest pain, unspecified: Secondary | ICD-10-CM | POA: Diagnosis not present

## 2016-10-12 MED ORDER — TECHNETIUM TC 99M TETROFOSMIN IV KIT
10.0000 | PACK | Freq: Once | INTRAVENOUS | Status: AC | PRN
  Administered 2016-10-12: 10 via INTRAVENOUS

## 2016-10-12 MED ORDER — REGADENOSON 0.4 MG/5ML IV SOLN
INTRAVENOUS | Status: AC
  Administered 2016-10-12: 0.4 mg via INTRAVENOUS
  Filled 2016-10-12: qty 5

## 2016-10-12 MED ORDER — REGADENOSON 0.4 MG/5ML IV SOLN
0.4000 mg | Freq: Once | INTRAVENOUS | Status: AC
  Administered 2016-10-12: 0.4 mg via INTRAVENOUS

## 2016-10-12 MED ORDER — TECHNETIUM TC 99M TETROFOSMIN IV KIT
30.0000 | PACK | Freq: Once | INTRAVENOUS | Status: AC | PRN
  Administered 2016-10-12: 30 via INTRAVENOUS

## 2016-11-25 ENCOUNTER — Other Ambulatory Visit: Payer: Self-pay | Admitting: Internal Medicine

## 2017-01-18 ENCOUNTER — Telehealth: Payer: Self-pay | Admitting: Family Medicine

## 2017-01-18 DIAGNOSIS — J4540 Moderate persistent asthma, uncomplicated: Secondary | ICD-10-CM

## 2017-01-18 MED ORDER — FLUTICASONE-SALMETEROL 250-50 MCG/DOSE IN AEPB: 1.0000 | INHALATION_SPRAY | Freq: Two times a day (BID) | RESPIRATORY_TRACT | 11 refills | Status: AC

## 2017-01-18 NOTE — Telephone Encounter (Signed)
Advair refilled.

## 2017-01-18 NOTE — Telephone Encounter (Signed)
Pt requesting refill on Advair to be sent to [facility]. He is completely out and needs it. Pt requests to get refills for 1 year instead of 90 days. Please f/u.

## 2017-02-08 ENCOUNTER — Ambulatory Visit: Payer: Medicaid Other | Admitting: Family Medicine

## 2017-04-05 ENCOUNTER — Encounter (INDEPENDENT_AMBULATORY_CARE_PROVIDER_SITE_OTHER): Payer: Self-pay | Admitting: Orthopaedic Surgery

## 2017-04-05 ENCOUNTER — Ambulatory Visit (INDEPENDENT_AMBULATORY_CARE_PROVIDER_SITE_OTHER): Payer: Medicaid Other | Admitting: Orthopaedic Surgery

## 2017-04-05 VITALS — BP 97/60 | HR 64 | Ht 67.0 in | Wt 212.0 lb

## 2017-04-05 DIAGNOSIS — M19011 Primary osteoarthritis, right shoulder: Secondary | ICD-10-CM | POA: Diagnosis not present

## 2017-04-05 MED ORDER — BUPIVACAINE HCL 0.25 % IJ SOLN
4.0000 mL | INTRAMUSCULAR | Status: AC | PRN
  Administered 2017-04-05: 4 mL via INTRA_ARTICULAR

## 2017-04-05 MED ORDER — METHYLPREDNISOLONE ACETATE 40 MG/ML IJ SUSP
40.0000 mg | INTRAMUSCULAR | Status: AC | PRN
  Administered 2017-04-05: 40 mg via INTRA_ARTICULAR

## 2017-04-05 MED ORDER — LIDOCAINE HCL 1 % IJ SOLN
3.0000 mL | INTRAMUSCULAR | Status: AC | PRN
  Administered 2017-04-05: 3 mL

## 2017-04-05 NOTE — Progress Notes (Signed)
Office Visit Note   Patient: Peter NEEB Sr.           Date of Birth: 09/30/1951           MRN: [id number] Visit Date: 04/05/2017              Requested by: Jaclyn Shaggy, MD [address] [place], [place] [address] PCP: Jaclyn Shaggy, MD   Assessment & Plan: Visit Diagnoses:  1. Osteoarthritis of right shoulder, unspecified osteoarthritis type     Plan: We elected to repeat right shoulder glenohumeral intra-articular injection. Patient sent shoulder was prepped injection was performed. Tolerated without complication. After a few minutes patient had complete relief of the shoulder pain. He will like to follow up in 3 months for recheck and have repeat injection at that time. He is not a good operative candidate 17% ejection fraction.  I think it is reasonable to continue conservative treatment with repeat shoulder injections.  Follow-Up Instructions: Return in about 3 months (around 07/06/2017).   Orders:  No orders of the defined types were placed in this encounter.  No orders of the defined types were placed in this encounter.     Procedures: Large Joint Inj Date/Time: 04/05/2017 10:28 AM Performed by: Naida Sleight Authorized by: Naida Sleight   Consent Given by:  Patient Indications:  Pain Location:  Shoulder Site:  R glenohumeral Prep: patient was prepped and draped in usual sterile fashion   Needle Size:  22 G Needle Length:  1.5 inches Approach:  Posterior Ultrasound Guidance: No   Fluoroscopic Guidance: No   Arthrogram: No   Medications:  3 mL lidocaine 1 %; 4 mL bupivacaine 0.25 %; 40 mg methylPREDNISolone acetate 40 MG/ML Aspiration Attempted: No   Patient tolerance:  Patient tolerated the procedure well with no immediate complications     Clinical Data: No additional findings.   Subjective: Chief Complaint  Patient presents with  . Right Shoulder - Pain    HPI Patient returns today with complaints of right shoulder pain and is  requesting repeat injection that was last on December 2017. Patient has no history of posttraumatic glenohumeral degenerative joint disease with screw from previous surgery that is broken.  I performed a intra-articular injection in December and this gave good relief for about 2-3 months. I'll refer him to Dr. Jackquline Bosch to discuss total shoulder replacement due to the complex nature of his problem. Patient has a history of heart disease and was advised by his cardiologist that he would not be cleared for any surgery. Had myocardial imaging study performed 10/12/2016 and this showed an ejection fraction of 17%. Review of Systems  Constitutional: Negative.   Musculoskeletal:       Right shoulder pain  Skin: Negative.   Neurological: Negative.   Psychiatric/Behavioral: Negative.      Objective: Vital Signs: BP 97/60   Pulse 64   Ht 5\' 7"  (1.702 m)   Wt 212 lb (96.2 kg)   BMI 33.20 kg/m   Physical Exam  Constitutional: He is oriented to person, place, and time. No distress.  HENT:  Head: Normocephalic and atraumatic.  Eyes: EOM are normal. Pupils are equal, round, and reactive to light.  Pulmonary/Chest: No respiratory distress.  Musculoskeletal:  Shoulder he does have good range of motion with flexion/abduction but with discomfort. Positive impingement test. Pain with shoulder internal/external rotation. Negative drop arm.  Neurological: He is alert and oriented to person, place, and time.  Skin: Skin is  warm and dry.  Psychiatric: He has a normal mood and affect.    Ortho Exam  Specialty Comments:  No specialty comments available.  Imaging: No results found.   PMFS History: Patient Active Problem List   Diagnosis Date Noted  . Asthma 09/21/2016  . Prediabetes 09/21/2016  . Acute coronary syndrome (HCC) 01/09/2016  . Chest pain 01/09/2016  . Hyperlipidemia 05/29/2014  . Cellulitis 05/18/2014  . 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 At risk for sudden cardiac death, hx MI, EF  25%, Lt main disease 04/28/2014  . NSVT (nonsustained ventricular tachycardia) (HCC) 04/28/2014  . Unstable angina (HCC) 04/24/2014  . Coronary atherosclerosis of native coronary artery 11/29/2013  . HTN (hypertension) 11/29/2013  . GERD (gastroesophageal reflux disease) 11/29/2013  . 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 Cardiomyopathy, ischemic 11/29/2013   Past Medical History:  Diagnosis Date  . Arthritis    OSTEO  . Asthma   . Chronic systolic dysfunction of left ventricle   . Coronary artery disease    DES proximal LAD  . Diabetes (HCC)   . GERD (gastroesophageal reflux disease)   . Gout   . HTN (hypertension)   . Hyperlipemia   . Ischemic cardiomyopathy   . Myocardial infarction (HCC) 07/2013   anterolateral MI  . Right shoulder pain    chronically on pain medicine    Family History  Problem Relation Age of Onset  . Cancer Mother   . Stroke Mother   . CAD Father   . Heart attack Father 53  . Heart attack Brother 44       x 3  . Cancer Sister     Past Surgical History:  Procedure Laterality Date  . BILATERAL SHOULDER ARTHROSCOPY    . CORONARY ARTERY BYPASS GRAFT N/A 05/01/2014   Procedure: CORONARY ARTERY BYPASS GRAFTING (CABG);  Surgeon: Alleen Borne, MD;  Location: [facility] OR;  Service: Open Heart Surgery;  Laterality: N/A;  Times 5 using left internal mammary artery and endoscopically harvested right saphenous vein  . CORONARY STENT PLACEMENT  2000, 2014  . INTRAOPERATIVE TRANSESOPHAGEAL ECHOCARDIOGRAM N/A 05/01/2014   Procedure: INTRAOPERATIVE TRANSESOPHAGEAL ECHOCARDIOGRAM;  Surgeon: Alleen Borne, MD;  Location: [facility] OR;  Service: Open Heart Surgery;  Laterality: N/A;  . LEFT HEART CATHETERIZATION WITH CORONARY ANGIOGRAM N/A 07/29/2013   Procedure: LEFT HEART CATHETERIZATION WITH CORONARY ANGIOGRAM;  Surgeon: Robynn Pane, MD;  Location: [facility] CATH LAB;  Service: Cardiovascular;  Laterality: N/A;  . LEFT HEART CATHETERIZATION WITH CORONARY ANGIOGRAM N/A  04/24/2014   Procedure: LEFT HEART CATHETERIZATION WITH CORONARY ANGIOGRAM;  Surgeon: Kathleene Hazel, MD;  Location: [facility] CATH LAB;  Service: Cardiovascular;  Laterality: N/A;  . PERCUTANEOUS STENT INTERVENTION  07/29/2013   Procedure: PERCUTANEOUS STENT INTERVENTION;  Surgeon: Robynn Pane, MD;  Location: [facility] CATH LAB;  Service: Cardiovascular;;   Social History   Occupational History  . Disabled    Social History Main Topics  . Smoking status: Former Smoker    Packs/day: 0.50    Years: 54.00    Types: Cigarettes  . Smokeless tobacco: Former Neurosurgeon    Types: Chew  . Alcohol use 7.0 oz/week    14 Standard drinks or equivalent per week     Comment: 2-3 beers per day  . Drug use: No  . Sexual activity: Not on file

## 2017-07-05 ENCOUNTER — Ambulatory Visit (INDEPENDENT_AMBULATORY_CARE_PROVIDER_SITE_OTHER): Payer: Medicaid Other | Admitting: Orthopaedic Surgery

## 2017-09-20 ENCOUNTER — Other Ambulatory Visit: Payer: Self-pay

## 2017-09-20 ENCOUNTER — Encounter (HOSPITAL_COMMUNITY): Payer: Self-pay | Admitting: Emergency Medicine

## 2017-09-20 ENCOUNTER — Emergency Department (HOSPITAL_COMMUNITY)
Admission: EM | Admit: 2017-09-20 | Discharge: 2017-09-20 | Disposition: A | Discharge status: Home / Self Care | Payer: Medicaid Other | Attending: Emergency Medicine | Admitting: Emergency Medicine

## 2017-09-20 DIAGNOSIS — J45909 Unspecified asthma, uncomplicated: Secondary | ICD-10-CM | POA: Insufficient documentation

## 2017-09-20 DIAGNOSIS — Z955 Presence of coronary angioplasty implant and graft: Secondary | ICD-10-CM | POA: Insufficient documentation

## 2017-09-20 DIAGNOSIS — I2510 Atherosclerotic heart disease of native coronary artery without angina pectoris: Secondary | ICD-10-CM | POA: Insufficient documentation

## 2017-09-20 DIAGNOSIS — J029 Acute pharyngitis, unspecified: Secondary | ICD-10-CM | POA: Diagnosis present

## 2017-09-20 DIAGNOSIS — I5022 Chronic systolic (congestive) heart failure: Secondary | ICD-10-CM | POA: Diagnosis not present

## 2017-09-20 DIAGNOSIS — Z951 Presence of aortocoronary bypass graft: Secondary | ICD-10-CM | POA: Insufficient documentation

## 2017-09-20 DIAGNOSIS — I110 Hypertensive heart disease with heart failure: Secondary | ICD-10-CM | POA: Diagnosis not present

## 2017-09-20 DIAGNOSIS — Z79899 Other long term (current) drug therapy: Secondary | ICD-10-CM | POA: Insufficient documentation

## 2017-09-20 DIAGNOSIS — Z87891 Personal history of nicotine dependence: Secondary | ICD-10-CM | POA: Insufficient documentation

## 2017-09-20 LAB — RAPID STREP SCREEN (MED CTR MEBANE ONLY): Streptococcus, Group A Screen (Direct): NEGATIVE

## 2017-09-20 NOTE — ED Provider Notes (Signed)
[facility] EMERGENCY DEPARTMENT Provider Note   CSN: [id number] Arrival date & time: 09/20/17  1055     History   Chief Complaint Chief Complaint  Patient presents with  . Lymphadenopathy  . Sore Throat    HPI Peter TRAWEEK Sr. is a 66 y.o. male.  HPI   66 year old male presents today with complaints of sore throat.  Patient notes that on Saturday he felt 1 of his lymph nodes on the right side swell up, tender to palpation.  Patient reports he is feeling well at that time.  He notes yesterday he developed a sore throat, painful swallowing.  He denies any other upper respiratory symptoms including congestion, rhinorrhea, cough.  Patient reports he has been using throat sprays which have improved his symptoms but only temporarily.  Patient denies any difficulty swallowing, fever, or any systemic symptoms.  Patient notes he has a granddaughter that has been sick recently.  Patient denies any other complaints here today.  Patient is a non-smoker.      Past Medical History:  Diagnosis Date  . Arthritis    OSTEO  . Asthma   . Chronic systolic dysfunction of left ventricle   . Coronary artery disease    DES proximal LAD  . Diabetes (HCC)   . GERD (gastroesophageal reflux disease)   . Gout   . HTN (hypertension)   . Hyperlipemia   . Ischemic cardiomyopathy   . Myocardial infarction (HCC) 07/2013   anterolateral MI  . Right shoulder pain    chronically on pain medicine    Patient Active Problem List   Diagnosis Date Noted  . Asthma 09/21/2016  . Prediabetes 09/21/2016  . Acute coronary syndrome (HCC) 01/09/2016  . Chest pain 01/09/2016  . Hyperlipidemia 05/29/2014  . Cellulitis 05/18/2014  . 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 At risk for sudden cardiac death, hx MI, EF 25%, Lt main disease 04/28/2014  . NSVT (nonsustained ventricular tachycardia) (HCC) 04/28/2014  . Unstable angina (HCC) 04/24/2014  . Coronary atherosclerosis of native coronary  artery 11/29/2013  . HTN (hypertension) 11/29/2013  . GERD (gastroesophageal reflux disease) 11/29/2013  . 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 Cardiomyopathy, ischemic 11/29/2013    Past Surgical History:  Procedure Laterality Date  . BILATERAL SHOULDER ARTHROSCOPY    . CORONARY ARTERY BYPASS GRAFT N/A 05/01/2014   Procedure: CORONARY ARTERY BYPASS GRAFTING (CABG);  Surgeon: Alleen Borne, MD;  Location: [facility] OR;  Service: Open Heart Surgery;  Laterality: N/A;  Times 5 using left internal mammary artery and endoscopically harvested right saphenous vein  . CORONARY STENT PLACEMENT  2000, 2014  . INTRAOPERATIVE TRANSESOPHAGEAL ECHOCARDIOGRAM N/A 05/01/2014   Procedure: INTRAOPERATIVE TRANSESOPHAGEAL ECHOCARDIOGRAM;  Surgeon: Alleen Borne, MD;  Location: [facility] OR;  Service: Open Heart Surgery;  Laterality: N/A;  . LEFT HEART CATHETERIZATION WITH CORONARY ANGIOGRAM N/A 07/29/2013   Procedure: LEFT HEART CATHETERIZATION WITH CORONARY ANGIOGRAM;  Surgeon: Robynn Pane, MD;  Location: [facility] CATH LAB;  Service: Cardiovascular;  Laterality: N/A;  . LEFT HEART CATHETERIZATION WITH CORONARY ANGIOGRAM N/A 04/24/2014   Procedure: LEFT HEART CATHETERIZATION WITH CORONARY ANGIOGRAM;  Surgeon: Kathleene Hazel, MD;  Location: [facility] CATH LAB;  Service: Cardiovascular;  Laterality: N/A;  . PERCUTANEOUS STENT INTERVENTION  07/29/2013   Procedure: PERCUTANEOUS STENT INTERVENTION;  Surgeon: Robynn Pane, MD;  Location: [facility] CATH LAB;  Service: Cardiovascular;;       Home Medications    Prior to Admission medications  Medication Sig Start Date End Date Taking? Authorizing Provider  albuterol (PROVENTIL) (2.5 MG/3ML) 0.083% nebulizer solution Take 3 mLs (2.5 mg total) by nebulization every 6 (six) hours as needed for wheezing or shortness of breath. 11/25/16   Quentin Angst, MD  ALPRAZolam Prudy Feeler) 0.5 MG tablet Take 1 tablet (0.5 mg total) by mouth at bedtime as needed for anxiety. 06/05/14    Doris Cheadle, MD  atorvastatin (LIPITOR) 20 MG tablet Take 1 tablet (20 mg total) by mouth daily. Patient not taking: Reported on 09/21/2016 02/18/16   Quentin Angst, MD  carvedilol (COREG) 3.125 MG tablet Take 1 tablet (3.125 mg total) by mouth 2 (two) times daily with a meal. 06/03/14   Kathleene Hazel, MD  clopidogrel (PLAVIX) 75 MG tablet Take 75 mg by mouth daily. Filled 12-30-15. For 30 days per pt's pharmacy    [provider]  esomeprazole (NEXIUM) 40 MG capsule Take 40 mg by mouth daily at 12 noon.    [provider]  fluticasone (FLONASE) 50 MCG/ACT nasal spray Place 2 sprays into both nostrils daily. 08/15/16   Quentin Angst, MD  Fluticasone-Salmeterol (ADVAIR DISKUS) 250-50 MCG/DOSE AEPB Inhale 1 puff into the lungs 2 (two) times daily. 01/18/17   Jaclyn Shaggy, MD  furosemide (LASIX) 40 MG tablet Take 1 tablet (40 mg total) by mouth daily. 03/30/15   Tomasita Crumble, MD  isosorbide mononitrate (IMDUR) 30 MG 24 hr tablet Take 1 tablet (30 mg total) by mouth daily. 01/10/16   Orpah Cobb, MD  nitroGLYCERIN (NITROSTAT) 0.4 MG SL tablet Place 0.4 mg under the tongue every 5 (five) minutes as needed for chest pain.    [provider]  Omeprazole-Sodium Bicarbonate (ZEGERID) 20-1100 MG CAPS capsule Take 1 capsule by mouth daily before breakfast.    [provider]  oxyCODONE-acetaminophen (PERCOCET) 10-325 MG tablet Take 1 tablet by mouth every 4 (four) hours as needed for pain.    [provider]  pantoprazole (PROTONIX) 40 MG tablet Take 1 tablet (40 mg total) by mouth daily. 01/11/16   Orpah Cobb, MD  sacubitril-valsartan (ENTRESTO) 24-26 MG Take 1 tablet by mouth 2 (two) times daily.    [provider]  spironolactone (ALDACTONE) 25 MG tablet Take 12.5 mg by mouth daily.    [provider]    Family History Family History  Problem Relation Age of Onset  . Cancer Mother   . Stroke Mother   . CAD Father     . Heart attack Father 57  . Heart attack Brother 44       x 3  . Cancer Sister     Social History Social History   Tobacco Use  . Smoking status: Former Smoker    Packs/day: 0.50    Years: 54.00    Pack years: 27.00    Types: Cigarettes  . Smokeless tobacco: Former Neurosurgeon    Types: Chew  Substance Use Topics  . Alcohol use: Yes    Alcohol/week: 7.0 oz    Types: 14 Standard drinks or equivalent per week    Comment: 2-3 beers per day  . Drug use: No     Allergies   Codeine   Review of Systems Review of Systems  All other systems reviewed and are negative.    Physical Exam Updated Vital Signs BP 94/72 (BP Location: Right Arm)   Pulse (!) 51   Temp 98 F (36.7 C) (Oral)   Resp 18   SpO2 100%  Physical Exam  Constitutional: He is oriented to person, place, and time. He appears well-developed and well-nourished.  HENT:  Head: Normocephalic and atraumatic.  Tender right anterior cervical lymph node with no surrounding swelling or edema  Tonsils equal bilateral no swelling or edema, no exudate, uvula midline rises with phonation, no signs of PTA or RPA no pooling of secretions no vesicles  Bilateral TMs normal  Eyes: Conjunctivae are normal. Pupils are equal, round, and reactive to light. Right eye exhibits no discharge. Left eye exhibits no discharge. No scleral icterus.  Neck: Normal range of motion. No JVD present. No tracheal deviation present.  Pulmonary/Chest: Effort normal. No stridor.  Neurological: He is alert and oriented to person, place, and time. Coordination normal.  Psychiatric: He has a normal mood and affect. His behavior is normal. Judgment and thought content normal.  Nursing note and vitals reviewed.    ED Treatments / Results  Labs (all labs ordered are listed, but only abnormal results are displayed) Labs Reviewed  RAPID STREP SCREEN (NOT [facility])  CULTURE, GROUP A STREP [facility])    EKG  EKG Interpretation None        Radiology No results found.  Procedures Procedures (including critical care time)  Medications Ordered in ED Medications - No data to display   Initial Impression / Assessment and Plan / ED Course  I have reviewed the triage vital signs and the nursing notes.  Pertinent labs & imaging results that were available during my care of the patient were reviewed by me and considered in my medical decision making (see chart for details).      Final Clinical Impressions(s) / ED Diagnoses   Final diagnoses:  Viral pharyngitis    Labs: Rapid strep negative  Imaging:  Consults:  Therapeutics:  Discharge Meds:   Assessment/Plan: 66 year old male presents today with likely viral URI.  Patient does have enlarged tender cervical lymph node with a sore throat.  He has no signs of acute bacterial infection strep is negative well-appearing in no acute distress.  Symptom medic care instructions given, strict return precautions given.  Patient will follow-up as an outpatient if lymph node is not reduced in size, return if swelling worsens.  Patient verbalized understanding and agreement to today's plan and no further questions or concerns at the time of discharge.`    ED Discharge Orders    None       Eyvonne MechanicHedges, Seona Clemenson, Cordelia Poche-C 09/20/17 1307    Long, Arlyss RepressJoshua G, MD 09/21/17 1057

## 2017-09-20 NOTE — ED Triage Notes (Signed)
Pt reports swollen gland on right side of throat since Friday, also endorses sore throat and ear pain. Denies fever or chills

## 2017-09-20 NOTE — Discharge Instructions (Signed)
Please read attached information. If you experience any new or worsening signs or symptoms please return to the emergency room for evaluation. Please follow-up with your primary care provider or specialist as discussed.  °

## 2017-09-20 NOTE — ED Notes (Signed)
Patient left without d/c instructions.

## 2017-09-22 LAB — CULTURE, GROUP A STREP (THRC)

## 2017-10-28 ENCOUNTER — Other Ambulatory Visit: Payer: Self-pay | Admitting: Internal Medicine

## 2018-01-02 ENCOUNTER — Telehealth (INDEPENDENT_AMBULATORY_CARE_PROVIDER_SITE_OTHER): Payer: Self-pay | Admitting: Orthopaedic Surgery

## 2018-01-02 NOTE — Telephone Encounter (Signed)
Pt called he is in a lot of pain due to his shoulder. Pt would like to come in earlier if possible I did sched pt for Friday @2 :30pm. Please call pt to discuss pt care

## 2018-01-03 NOTE — Telephone Encounter (Signed)
No earlier appointments available. No cancellations at this time.

## 2018-01-05 ENCOUNTER — Ambulatory Visit (INDEPENDENT_AMBULATORY_CARE_PROVIDER_SITE_OTHER): Payer: Medicare Other | Admitting: Orthopaedic Surgery

## 2018-01-05 ENCOUNTER — Encounter (INDEPENDENT_AMBULATORY_CARE_PROVIDER_SITE_OTHER): Payer: Self-pay | Admitting: Orthopaedic Surgery

## 2018-01-05 VITALS — BP 122/74 | HR 65 | Ht 68.0 in | Wt 222.0 lb

## 2018-01-05 DIAGNOSIS — M19011 Primary osteoarthritis, right shoulder: Secondary | ICD-10-CM

## 2018-01-05 DIAGNOSIS — Z9189 Other specified personal risk factors, not elsewhere classified: Secondary | ICD-10-CM | POA: Diagnosis not present

## 2018-01-05 NOTE — Telephone Encounter (Signed)
Pt has appt this afternoon

## 2018-01-05 NOTE — Progress Notes (Signed)
Office Visit Note   Patient: Peter SELOVER Sr.           Date of Birth: 1951-08-09           MRN: [id number] Visit Date: 01/05/2018              Requested by: Hoy Register, MD [address] [place], [place] [address] PCP: Hoy Register, MD   Assessment & Plan: Visit Diagnoses:  1. Primary osteoarthritis, right shoulder   2. At risk for sudden cardiac death, hx MI, EF 25%, Lt main disease     Plan: Patient is not a candidate for surgery due to his severe heart failure terrible ejection fraction previous bypass x5 and left main disease.  Injection performed which she tolerated well this previously has worked well return.  Follow-Up Instructions: No follow-ups on file.   Orders:  No orders of the defined types were placed in this encounter.  No orders of the defined types were placed in this encounter.     Procedures: No procedures performed   Clinical Data: No additional findings.   Subjective: Chief Complaint  Patient presents with  . Right Shoulder - Pain    HPI patient returns he states [place] the beach and is now [place] in town and requests a repeat injection in the shoulder.  Had previous surgery back both shoulders with a Bristow procedure and has migrated screw on one side and broken screw on the other.  Secondary glenohumeral arthritis due to instability.  Patient's last recorded ejection fraction was 17% and is not a surgical candidate for total shoulder arthroplasty.  Patient is requesting a repeat injection right shoulder which is worked well in the past.  Review of Systems unchanged from last office visit.   Objective: Vital Signs: BP 122/74   Pulse 65   Ht 5\' 8"  (1.727 m)   Wt 222 lb (100.7 kg)   BMI 33.75 kg/m   Physical Exam  Constitutional: He is oriented to person, place, and time. He appears well-developed and well-nourished.  HENT:  Head: Normocephalic and atraumatic.  Eyes: Pupils are equal, round, and  reactive to light. EOM are normal.  Neck: No tracheal deviation present. No thyromegaly present.  Cardiovascular: Normal rate.  Pulmonary/Chest: Effort normal. He has no wheezes.  Abdominal: Soft. Bowel sounds are normal.  Neurological: He is alert and oriented to person, place, and time.  Skin: Skin is warm and dry. Capillary refill takes less than 2 seconds.  Psychiatric: He has a normal mood and affect. His behavior is normal. Judgment and thought content normal.  Healed median sternotomy.  Healed anterior shoulder incision.  Patient has crepitus with shoulder range of motion.  Patient has some dyspnea with conversation.  Ortho Exam hand sensation is intact.  Mild periscapular atrophy.  Specialty Comments:  No specialty comments available.  Imaging: No results found.   PMFS History: Patient Active Problem List   Diagnosis Date Noted  . Asthma 09/21/2016  . Prediabetes 09/21/2016  . Acute coronary syndrome (HCC) 01/09/2016  . Chest pain 01/09/2016  . Hyperlipidemia 05/29/2014  . Cellulitis 05/18/2014  . 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 At risk for sudden cardiac death, hx MI, EF 25%, Lt main disease 04/28/2014  . NSVT (nonsustained ventricular tachycardia) (HCC) 04/28/2014  . Unstable angina (HCC) 04/24/2014  . Coronary atherosclerosis of native coronary artery 11/29/2013  . HTN (hypertension) 11/29/2013  . GERD (gastroesophageal reflux disease) 11/29/2013  . Tobacco  abuse, in remission 11/29/2013  . Cardiomyopathy, ischemic 11/29/2013   Past Medical History:  Diagnosis Date  . Arthritis    OSTEO  . Asthma   . Chronic systolic dysfunction of left ventricle   . Coronary artery disease    DES proximal LAD  . Diabetes (HCC)   . GERD (gastroesophageal reflux disease)   . Gout   . HTN (hypertension)   . Hyperlipemia   . Ischemic cardiomyopathy   . Myocardial infarction (HCC) 07/2013   anterolateral MI  . Right shoulder pain    chronically on pain medicine    Family  History  Problem Relation Age of Onset  . Cancer Mother   . Stroke Mother   . CAD Father   . Heart attack Father 85  . Heart attack Brother 44       x 3  . Cancer Sister     Past Surgical History:  Procedure Laterality Date  . BILATERAL SHOULDER ARTHROSCOPY    . CORONARY ARTERY BYPASS GRAFT N/A 05/01/2014   Procedure: CORONARY ARTERY BYPASS GRAFTING (CABG);  Surgeon: Alleen Borne, MD;  Location: [facility] OR;  Service: Open Heart Surgery;  Laterality: N/A;  Times 5 using left internal mammary artery and endoscopically harvested right saphenous vein  . CORONARY STENT PLACEMENT  2000, 2014  . INTRAOPERATIVE TRANSESOPHAGEAL ECHOCARDIOGRAM N/A 05/01/2014   Procedure: INTRAOPERATIVE TRANSESOPHAGEAL ECHOCARDIOGRAM;  Surgeon: Alleen Borne, MD;  Location: [facility] OR;  Service: Open Heart Surgery;  Laterality: N/A;  . LEFT HEART CATHETERIZATION WITH CORONARY ANGIOGRAM N/A 07/29/2013   Procedure: LEFT HEART CATHETERIZATION WITH CORONARY ANGIOGRAM;  Surgeon: Robynn Pane, MD;  Location: [facility] CATH LAB;  Service: Cardiovascular;  Laterality: N/A;  . LEFT HEART CATHETERIZATION WITH CORONARY ANGIOGRAM N/A 04/24/2014   Procedure: LEFT HEART CATHETERIZATION WITH CORONARY ANGIOGRAM;  Surgeon: Kathleene Hazel, MD;  Location: [facility] CATH LAB;  Service: Cardiovascular;  Laterality: N/A;  . PERCUTANEOUS STENT INTERVENTION  07/29/2013   Procedure: PERCUTANEOUS STENT INTERVENTION;  Surgeon: Robynn Pane, MD;  Location: [facility] CATH LAB;  Service: Cardiovascular;;   Social History   Occupational History  . Occupation: Disabled  Tobacco Use  . Smoking status: Former Smoker    Packs/day: 0.50    Years: 54.00    Pack years: 27.00    Types: Cigarettes  . Smokeless tobacco: Former Neurosurgeon    Types: Chew  Substance and Sexual Activity  . Alcohol use: Yes    Alcohol/week: 7.0 oz    Types: 14 Standard drinks or equivalent per week    Comment: 2-3 beers per day  . Drug use: No  . Sexual activity: Not on file

## 2018-01-10 ENCOUNTER — Other Ambulatory Visit: Payer: Self-pay | Admitting: Family Medicine

## 2018-01-10 DIAGNOSIS — J4540 Moderate persistent asthma, uncomplicated: Secondary | ICD-10-CM

## 2018-02-02 ENCOUNTER — Other Ambulatory Visit: Payer: Self-pay | Admitting: Family Medicine

## 2018-02-02 DIAGNOSIS — J4540 Moderate persistent asthma, uncomplicated: Secondary | ICD-10-CM

## 2018-04-13 ENCOUNTER — Ambulatory Visit (INDEPENDENT_AMBULATORY_CARE_PROVIDER_SITE_OTHER): Payer: Medicare Other | Admitting: Orthopaedic Surgery

## 2018-04-24 ENCOUNTER — Encounter (INDEPENDENT_AMBULATORY_CARE_PROVIDER_SITE_OTHER): Payer: Self-pay | Admitting: Orthopaedic Surgery

## 2018-04-24 ENCOUNTER — Ambulatory Visit (INDEPENDENT_AMBULATORY_CARE_PROVIDER_SITE_OTHER): Payer: Self-pay

## 2018-04-24 ENCOUNTER — Ambulatory Visit (INDEPENDENT_AMBULATORY_CARE_PROVIDER_SITE_OTHER): Payer: Medicare Other | Admitting: Orthopaedic Surgery

## 2018-04-24 VITALS — BP 114/71 | HR 70 | Ht 67.0 in | Wt 214.0 lb

## 2018-04-24 DIAGNOSIS — M19011 Primary osteoarthritis, right shoulder: Secondary | ICD-10-CM

## 2018-04-24 DIAGNOSIS — M7541 Impingement syndrome of right shoulder: Secondary | ICD-10-CM

## 2018-04-24 DIAGNOSIS — M4807 Spinal stenosis, lumbosacral region: Secondary | ICD-10-CM | POA: Diagnosis not present

## 2018-04-24 MED ORDER — METHYLPREDNISOLONE ACETATE 40 MG/ML IJ SUSP
40.0000 mg | INTRAMUSCULAR | Status: AC | PRN
  Administered 2018-04-24: 40 mg via INTRA_ARTICULAR

## 2018-04-24 MED ORDER — LIDOCAINE HCL 1 % IJ SOLN
3.0000 mL | INTRAMUSCULAR | Status: AC | PRN
  Administered 2018-04-24: 3 mL

## 2018-04-24 MED ORDER — BUPIVACAINE HCL 0.25 % IJ SOLN
4.0000 mL | INTRAMUSCULAR | Status: AC | PRN
  Administered 2018-04-24: 4 mL via INTRA_ARTICULAR

## 2018-04-24 NOTE — Progress Notes (Signed)
Office Visit Note   Patient: Peter Booker Sr.           Date of Birth: 01/24/1951           MRN: [id number] Visit Date: 04/24/2018              Requested by: Hoy Register, MD [address] [place], [place] [address] PCP: Hoy Register, MD   Assessment & Plan: Visit Diagnoses:  1. Primary osteoarthritis, right shoulder   2. Spinal stenosis of lumbosacral region     Plan: SPC documented in Dr. Marlene Bast last office note patient is not an operative candidate due to history of cardiac issues.  Last documented ejection fraction was 17%.  Advised patient that I feel comfortable repeating right shoulder subacromial injections every 3 months if needed.  At patient consent right shoulder was prepped with Betadine and subacromial Marcaine/Depo-Medrol injection was performed from a posterolateral approach.  Tolerated injection well without convocation.  After sitting for a few minutes patient reported excellent relief with Marcaine in place and range of motion also improved.  Follow-up in 3 months for recheck.  Follow-Up Instructions: Return in about 3 months (around 07/25/2018) for with Selina Tapper recheck right shoulder.   Orders:  Orders Placed This Encounter  Procedures  . MR Lumbar Spine w/o contrast  . XR Shoulder Right   No orders of the defined types were placed in this encounter.     Procedures: Large Joint Inj: R subacromial bursa on 04/24/2018 2:26 PM Details: 25 G 1.5 in needle, posterior approach Medications: 3 mL lidocaine 1 %; 4 mL bupivacaine 0.25 %; 40 mg methylPREDNISolone acetate 40 MG/ML Outcome: tolerated well, no immediate complications  At this sitting for a couple minutes patient reported excellent relief of his pain with Marcaine in place. Procedure, treatment alternatives, risks and benefits explained, specific risks discussed. Consent was given by the patient. Patient was prepped and draped in the usual sterile fashion.       Clinical Data: No  additional findings.   Subjective: Chief Complaint  Patient presents with  . Right Shoulder - Follow-up    HPI 67 year old white male with history of end-stage DJD right shoulder comes in requesting another injection.  He was seen in the office in March for injection at that time and states that he had good relief for over a month.  Pain in shoulder with attempted overactivity.  Pain when he lays on his shoulder at night.  Again feels like he is losing motion.  Patient understands that with his cardiac history that he is not an operative candidate and is wanting to continue conservative management with injections periodically. Review of Systems No current cardiac pulmonary GI GU complaints.  Objective: Vital Signs: BP 114/71   Pulse 70   Ht 5\' 7"  (1.702 m)   Wt 214 lb (97.1 kg)   BMI 33.52 kg/m   Physical Exam  Constitutional: He is oriented to person, place, and time. He appears well-nourished. No distress.  HENT:  Head: Normocephalic.  Eyes: Pupils are equal, round, and reactive to light.  Pulmonary/Chest: No respiratory distress.  Musculoskeletal:  Right shoulder positive impingement test.  Passive flexion/abduction  about 90-100 degrees with pain pain with right shoulder logroll..  Neurological: He is alert and oriented to person, place, and time.  Skin: Skin is warm and dry.    Ortho Exam  Specialty Comments:  No specialty comments available.  Imaging: No results found.   PMFS History: Patient Active Problem  List   Diagnosis Date Noted  . Asthma 09/21/2016  . Prediabetes 09/21/2016  . Acute coronary syndrome (HCC) 01/09/2016  . Chest pain 01/09/2016  . Hyperlipidemia 05/29/2014  . Cellulitis 05/18/2014  . 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 At risk for sudden cardiac death, hx MI, EF 25%, Lt main disease 04/28/2014  . NSVT (nonsustained ventricular tachycardia) (HCC) 04/28/2014  . Unstable angina (HCC) 04/24/2014  . Coronary atherosclerosis of native coronary  artery 11/29/2013  . HTN (hypertension) 11/29/2013  . GERD (gastroesophageal reflux disease) 11/29/2013  . 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 Cardiomyopathy, ischemic 11/29/2013   Past Medical History:  Diagnosis Date  . Arthritis    OSTEO  . Asthma   . Chronic systolic dysfunction of left ventricle   . Coronary artery disease    DES proximal LAD  . Diabetes (HCC)   . GERD (gastroesophageal reflux disease)   . Gout   . HTN (hypertension)   . Hyperlipemia   . Ischemic cardiomyopathy   . Myocardial infarction (HCC) 07/2013   anterolateral MI  . Right shoulder pain    chronically on pain medicine    Family History  Problem Relation Age of Onset  . Cancer Mother   . Stroke Mother   . CAD Father   . Heart attack Father 51  . Heart attack Brother 44       x 3  . Cancer Sister     Past Surgical History:  Procedure Laterality Date  . BILATERAL SHOULDER ARTHROSCOPY    . CORONARY ARTERY BYPASS GRAFT N/A 05/01/2014   Procedure: CORONARY ARTERY BYPASS GRAFTING (CABG);  Surgeon: Alleen Borne, MD;  Location[facility] OR;  Service: Open Heart Surgery;  Laterality: N/A;  Times 5 using left internal mammary artery and endoscopically harvested right saphenous vein  . CORONARY STENT PLACEMENT  2000, 2014  . INTRAOPERATIVE TRANSESOPHAGEAL ECHOCARDIOGRAM N/A 05/01/2014   Procedure: INTRAOPERATIVE TRANSESOPHAGEAL ECHOCARDIOGRAM;  Surgeon: Alleen Borne, MD;  Location: [facility] OR;  Service: Open Heart Surgery;  Laterality: N/A;  . LEFT HEART CATHETERIZATION WITH CORONARY ANGIOGRAM N/A 07/29/2013   Procedure: LEFT HEART CATHETERIZATION WITH CORONARY ANGIOGRAM;  Surgeon: Robynn Pane, MD;  Location: [facility] CATH LAB;  Service: Cardiovascular;  Laterality: N/A;  . LEFT HEART CATHETERIZATION WITH CORONARY ANGIOGRAM N/A 04/24/2014   Procedure: LEFT HEART CATHETERIZATION WITH CORONARY ANGIOGRAM;  Surgeon: Kathleene Hazel, MD;  Location[facility] CATH LAB;  Service: Cardiovascular;  Laterality: N/A;  .  PERCUTANEOUS STENT INTERVENTION  07/29/2013   Procedure: PERCUTANEOUS STENT INTERVENTION;  Surgeon: Robynn Pane, MD;  Location: [facility] CATH LAB;  Service: Cardiovascular;;   Social History   Occupational History  . Occupation: Disabled  Tobacco Use  . Smoking status: Former Smoker    Packs/day: 0.50    Years: 54.00    Pack years: 27.00    Types: Cigarettes  . Smokeless tobacco: Former Neurosurgeon    Types: Chew  Substance and Sexual Activity  . Alcohol use: Yes    Alcohol/week: 8.4 oz    Types: 14 Standard drinks or equivalent per week    Comment: 2-3 beers per day  . Drug use: No  . Sexual activity: Not on file

## 2018-06-21 ENCOUNTER — Encounter (INDEPENDENT_AMBULATORY_CARE_PROVIDER_SITE_OTHER): Payer: Self-pay | Admitting: *Deleted

## 2018-07-06 ENCOUNTER — Other Ambulatory Visit (INDEPENDENT_AMBULATORY_CARE_PROVIDER_SITE_OTHER): Payer: Self-pay | Admitting: Surgery

## 2018-07-25 ENCOUNTER — Ambulatory Visit (INDEPENDENT_AMBULATORY_CARE_PROVIDER_SITE_OTHER): Payer: Medicare Other | Admitting: Surgery

## 2018-08-16 ENCOUNTER — Other Ambulatory Visit: Payer: Self-pay

## 2018-08-16 ENCOUNTER — Encounter (HOSPITAL_COMMUNITY): Payer: Self-pay | Admitting: Emergency Medicine

## 2018-08-16 ENCOUNTER — Emergency Department (HOSPITAL_COMMUNITY)
Admission: EM | Admit: 2018-08-16 | Discharge: 2018-08-16 | Disposition: A | Discharge status: Home / Self Care | Payer: Medicare Other | Attending: Emergency Medicine | Admitting: Emergency Medicine

## 2018-08-16 ENCOUNTER — Encounter (HOSPITAL_COMMUNITY): Payer: Self-pay

## 2018-08-16 DIAGNOSIS — R079 Chest pain, unspecified: Secondary | ICD-10-CM | POA: Insufficient documentation

## 2018-08-16 DIAGNOSIS — Z5321 Procedure and treatment not carried out due to patient leaving prior to being seen by health care provider: Secondary | ICD-10-CM | POA: Diagnosis not present

## 2018-08-16 LAB — BASIC METABOLIC PANEL
ANION GAP: 9 (ref 5–15)
BUN: 8 mg/dL (ref 8–23)
CALCIUM: 9.3 mg/dL (ref 8.9–10.3)
CO2: 25 mmol/L (ref 22–32)
CREATININE: 0.97 mg/dL (ref 0.61–1.24)
Chloride: 101 mmol/L (ref 98–111)
Glucose, Bld: 128 mg/dL — ABNORMAL HIGH (ref 70–99)
Potassium: 3.9 mmol/L (ref 3.5–5.1)
SODIUM: 135 mmol/L (ref 135–145)

## 2018-08-16 LAB — I-STAT TROPONIN, ED: TROPONIN I, POC: 0 ng/mL (ref 0.00–0.08)

## 2018-08-16 LAB — CBC
HCT: 50.2 % (ref 39.0–52.0)
Hemoglobin: 17.1 g/dL — ABNORMAL HIGH (ref 13.0–17.0)
MCH: 33.4 pg (ref 26.0–34.0)
MCHC: 34.1 g/dL (ref 30.0–36.0)
MCV: 98 fL (ref 80.0–100.0)
NRBC: 0 % (ref 0.0–0.2)
PLATELETS: 194 10*3/uL (ref 150–400)
RBC: 5.12 MIL/uL (ref 4.22–5.81)
RDW: 12.7 % (ref 11.5–15.5)
WBC: 9.8 10*3/uL (ref 4.0–10.5)

## 2018-08-16 NOTE — ED Notes (Signed)
No answer from lobby  

## 2018-08-16 NOTE — ED Triage Notes (Signed)
Pt presents for evaluation of L sided chest pain with radiation to bilateral arms and neck. Pt reports hx of MI. States sob at baseline.

## 2018-08-16 NOTE — ED Triage Notes (Signed)
Pt c/o left side chest pains that radiates to left arm that started this morning. Reports he was at [facility] Ed and had EKG and blood work done but then was told that had to go wait in lobby for about hour and half, so patient left and came here.  Pt reports Hx  MI and would like EKG done.   EKG performed and given to Dr Particia Nearing. Patient sent to lobby to wait for available room.

## 2018-08-16 NOTE — ED Notes (Signed)
Pt called from the lobby with no response 

## 2018-10-31 ENCOUNTER — Ambulatory Visit (HOSPITAL_COMMUNITY)
Admission: EM | Admit: 2018-10-31 | Discharge: 2018-10-31 | Disposition: A | Discharge status: Home / Self Care | Payer: Medicare Other | Attending: Family Medicine | Admitting: Family Medicine

## 2018-10-31 ENCOUNTER — Other Ambulatory Visit: Payer: Self-pay

## 2018-10-31 ENCOUNTER — Encounter (HOSPITAL_COMMUNITY): Payer: Self-pay | Admitting: Emergency Medicine

## 2018-10-31 DIAGNOSIS — M109 Gout, unspecified: Secondary | ICD-10-CM | POA: Diagnosis not present

## 2018-10-31 DIAGNOSIS — Z760 Encounter for issue of repeat prescription: Secondary | ICD-10-CM | POA: Insufficient documentation

## 2018-10-31 MED ORDER — COLCHICINE 0.6 MG PO TABS: ORAL_TABLET | ORAL | 0 refills | Status: DC

## 2018-10-31 MED ORDER — INDOMETHACIN 50 MG PO CAPS: 50.0000 mg | ORAL_CAPSULE | Freq: Two times a day (BID) | ORAL | 0 refills | Status: DC

## 2018-10-31 MED ORDER — ALLOPURINOL 100 MG PO TABS: 100.0000 mg | ORAL_TABLET | Freq: Every day | ORAL | 0 refills | Status: DC

## 2018-10-31 NOTE — ED Provider Notes (Signed)
[facility]    CSN: [id number] Arrival date & time: 10/31/18  1631     History   Chief Complaint Chief Complaint  Patient presents with  . Medication Refill    HPI Peter AUSTRIA Sr. is a 68 y.o. male history of arthritis, CAD, DM type II, gout, hypertension, hyperlipidemia, presenting today for evaluation of his gout.  Patient ran out of allopurinol 6 months ago.  Until recently he has not had any issues.  Over the past 2 to 3 days he has developed increased pain to his right great toe.  Feels like a typical gout flare.  Denies any injury.  Pain with walking.  In the past he has used colchicine and indomethacin.  HPI  Past Medical History:  Diagnosis Date  . Arthritis    OSTEO  . Asthma   . Chronic systolic dysfunction of left ventricle   . Coronary artery disease    DES proximal LAD  . Diabetes (HCC)   . GERD (gastroesophageal reflux disease)   . Gout   . HTN (hypertension)   . Hyperlipemia   . Ischemic cardiomyopathy   . Myocardial infarction (HCC) 07/2013   anterolateral MI  . Right shoulder pain    chronically on pain medicine    Patient Active Problem List   Diagnosis Date Noted  . Asthma 09/21/2016  . Prediabetes 09/21/2016  . Acute coronary syndrome (HCC) 01/09/2016  . Chest pain 01/09/2016  . Hyperlipidemia 05/29/2014  . Cellulitis 05/18/2014  . 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 At risk for sudden cardiac death, hx MI, EF 25%, Lt main disease 04/28/2014  . NSVT (nonsustained ventricular tachycardia) (HCC) 04/28/2014  . Unstable angina (HCC) 04/24/2014  . Coronary atherosclerosis of native coronary artery 11/29/2013  . HTN (hypertension) 11/29/2013  . GERD (gastroesophageal reflux disease) 11/29/2013  . 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 Cardiomyopathy, ischemic 11/29/2013    Past Surgical History:  Procedure Laterality Date  . BILATERAL SHOULDER ARTHROSCOPY    . CORONARY ARTERY BYPASS GRAFT N/A 05/01/2014   Procedure: CORONARY  ARTERY BYPASS GRAFTING (CABG);  Surgeon: Alleen Borne, MD;  Location: [facility] OR;  Service: Open Heart Surgery;  Laterality: N/A;  Times 5 using left internal mammary artery and endoscopically harvested right saphenous vein  . CORONARY STENT PLACEMENT  2000, 2014  . INTRAOPERATIVE TRANSESOPHAGEAL ECHOCARDIOGRAM N/A 05/01/2014   Procedure: INTRAOPERATIVE TRANSESOPHAGEAL ECHOCARDIOGRAM;  Surgeon: Alleen Borne, MD;  Location: [facility] OR;  Service: Open Heart Surgery;  Laterality: N/A;  . LEFT HEART CATHETERIZATION WITH CORONARY ANGIOGRAM N/A 07/29/2013   Procedure: LEFT HEART CATHETERIZATION WITH CORONARY ANGIOGRAM;  Surgeon: Robynn Pane, MD;  [facility]: [facility];  Service: Cardiovascular;  Laterality: N/A;  . LEFT HEART CATHETERIZATION WITH CORONARY ANGIOGRAM N/A 04/24/2014   Procedure: LEFT HEART CATHETERIZATION WITH CORONARY ANGIOGRAM;  Surgeon: Kathleene Hazel, MD;  Location: [facility] CATH LAB;  Service: Cardiovascular;  Laterality: N/A;  . PERCUTANEOUS STENT INTERVENTION  07/29/2013   Procedure: PERCUTANEOUS STENT INTERVENTION;  Surgeon: Robynn Pane, MD;  [facility]: [facility];  Service: Cardiovascular;;       Home Medications    Prior to Admission medications   Medication Sig Start Date End Date Taking? Authorizing Provider  carvedilol (COREG) 3.125 MG tablet Take 1 tablet (3.125 mg total) by mouth 2 (two) times daily with a meal. 06/03/14  Yes Kathleene Hazel, MD  clopidogrel (PLAVIX) 75 MG tablet Take 75 mg by mouth daily. Filled 12-30-15. For  30 days per pt's pharmacy   Yes [provider]  esomeprazole (NEXIUM) 40 MG capsule Take 40 mg by mouth daily at 12 noon.   Yes [provider]  fluticasone (FLONASE) 50 MCG/ACT nasal spray Place 2 sprays into both nostrils daily. 08/15/16  Yes Jegede, Phylliss Blakeslugbemiga E, MD  Fluticasone-Salmeterol (ADVAIR DISKUS) 250-50 MCG/DOSE AEPB Inhale 1 puff into the lungs 2 (two) times daily. 01/18/17  Yes Hoy RegisterNewlin, Enobong, MD  furosemide  (LASIX) 40 MG tablet Take 1 tablet (40 mg total) by mouth daily. 03/30/15  Yes Tomasita Crumbleni, Adeleke, MD  isosorbide mononitrate (IMDUR) 30 MG 24 hr tablet Take 1 tablet (30 mg total) by mouth daily. 01/10/16  Yes Orpah CobbKadakia, Ajay, MD  pantoprazole (PROTONIX) 40 MG tablet Take 1 tablet (40 mg total) by mouth daily. 01/11/16  Yes Orpah CobbKadakia, Ajay, MD  sacubitril-valsartan (ENTRESTO) 24-26 MG Take 1 tablet by mouth 2 (two) times daily.   Yes [provider]  albuterol (PROVENTIL) (2.5 MG/3ML) 0.083% nebulizer solution Take 3 mLs (2.5 mg total) by nebulization every 6 (six) hours as needed for wheezing or shortness of breath. 11/25/16   Quentin AngstJegede, Olugbemiga E, MD  allopurinol (ZYLOPRIM) 100 MG tablet Take 1 tablet (100 mg total) by mouth daily. Start after flare resolved 10/31/18   Wieters, Hallie C, PA-C  colchicine 0.6 MG tablet Day 1: Take 2 tablets now, take 1 additional tablet in 1 hour; Day 2: 1 tablet twice daily 10/31/18   Wieters, Hallie C, PA-C  indomethacin (INDOCIN) 50 MG capsule Take 1 capsule (50 mg total) by mouth 2 (two) times daily with a meal. 10/31/18   Wieters, Hallie C, PA-C  nitroGLYCERIN (NITROSTAT) 0.4 MG SL tablet Place 0.4 mg under the tongue every 5 (five) minutes as needed for chest pain.    [provider]  Omeprazole-Sodium Bicarbonate (ZEGERID) 20-1100 MG CAPS capsule Take 1 capsule by mouth daily before breakfast.    [provider]  oxyCODONE-acetaminophen (PERCOCET) 10-325 MG tablet Take 1 tablet by mouth every 4 (four) hours as needed for pain.    [provider]    Family History Family History  Problem Relation Age of Onset  . Cancer Mother   . Stroke Mother   . CAD Father   . Heart attack Father 1646  . Heart attack Brother 44       x 3  . Cancer Sister     Social History Social History   Tobacco Use  . Smoking status: Former Smoker    Packs/day: 0.50    Years: 54.00    Pack years: 27.00    Types: Cigarettes  . Smokeless tobacco: Former  NeurosurgeonUser    Types: Chew  Substance Use Topics  . Alcohol use: Yes    Alcohol/week: 14.0 standard drinks    Types: 14 Standard drinks or equivalent per week    Comment: 2-3 beers per day  . Drug use: No     Allergies   Codeine   Review of Systems Review of Systems  Constitutional: Negative for fatigue and fever.  Eyes: Negative for redness, itching and visual disturbance.  Respiratory: Negative for shortness of breath.   Cardiovascular: Negative for chest pain and leg swelling.  Gastrointestinal: Negative for nausea and vomiting.  Musculoskeletal: Positive for arthralgias, gait problem and myalgias.  Skin: Positive for color change. Negative for rash and wound.  Neurological: Negative for dizziness, syncope, weakness, light-headedness and headaches.     Physical Exam Triage Vital Signs ED Triage Vitals  Enc Vitals Group     BP 10/31/18 1648 110/67     Pulse Rate 10/31/18 1648 63     Resp 10/31/18 1648 18     Temp 10/31/18 1648 98.6 F (37 C)     Temp Source 10/31/18 1648 Temporal     SpO2 10/31/18 1648 96 %     Weight --      Height --      Head Circumference --      Peak Flow --      Pain Score 10/31/18 1644 10     Pain Loc --      Pain Edu? --      Excl. in GC? --    No data found.  Updated Vital Signs BP 110/67 (BP Location: Right Arm)   Pulse 63   Temp 98.6 F (37 C) (Temporal)   Resp 18   SpO2 96%   Visual Acuity Right Eye Distance:   Left Eye Distance:   Bilateral Distance:    Right Eye Near:   Left Eye Near:    Bilateral Near:     Physical Exam Vitals signs and nursing note reviewed.  Constitutional:      Appearance: He is well-developed.     Comments: No acute distress  HENT:     Head: Normocephalic and atraumatic.     Nose: Nose normal.  Eyes:     Conjunctiva/sclera: Conjunctivae normal.  Neck:     Musculoskeletal: Neck supple.  Cardiovascular:     Rate and Rhythm: Normal rate.  Pulmonary:     Effort: Pulmonary effort is normal. No  respiratory distress.  Abdominal:     General: There is no distension.  Musculoskeletal: Normal range of motion.     Comments: Right great toe with erythema warmth and mild swelling to base of toe  Dorsalis pedis 2+  Skin:    General: Skin is warm and dry.  Neurological:     Mental Status: He is alert and oriented to person, place, and time.      UC Treatments / Results  Labs (all labs ordered are listed, but only abnormal results are displayed) Labs Reviewed - No data to display  EKG None  Radiology No results found.  Procedures Procedures (including critical care time)  Medications Ordered in UC Medications - No data to display  Initial Impression / Assessment and Plan / UC Course  I have reviewed the triage vital signs and the nursing notes.  Pertinent labs & imaging results that were available during my care of the patient were reviewed by me and considered in my medical decision making (see chart for details).     Patient appears to have acute gout flare of right great toe.  Will prescribe colchicine given this is been helpful for him in the past.  Will provide indomethacin to use after colchicine in order to prevent further stomach upset.  Provided allopurinol to restart after flare has resolved.  Advised not to take this during an acute flare.  Follow-up with PCP for further refills and as well as lab monitoring related to these medicines.Discussed strict return precautions. Patient verbalized understanding and is agreeable with plan.  Final Clinical Impressions(s) / UC Diagnoses   Final diagnoses:  Acute gout involving toe of right foot, unspecified cause  Medication refill     Discharge Instructions     Please begin colchicine as prescribed May use indomethacin after finished with colchicine Begin allopurinol after pain/gout flare resolved.  Follow  up with primary care for further refills of allopurinol in order to have blood work checked.    ED  Prescriptions    Medication Sig Dispense Auth. Provider   colchicine 0.6 MG tablet Day 1: Take 2 tablets now, take 1 additional tablet in 1 hour; Day 2: 1 tablet twice daily 6 tablet Wieters, Hallie C, PA-C   allopurinol (ZYLOPRIM) 100 MG tablet Take 1 tablet (100 mg total) by mouth daily. Start after flare resolved 90 tablet Wieters, Hallie C, PA-C   indomethacin (INDOCIN) 50 MG capsule Take 1 capsule (50 mg total) by mouth 2 (two) times daily with a meal. 30 capsule Wieters, Hallie C, PA-C     Controlled Substance Prescriptions [place] Controlled Substance Registry consulted? Not Applicable   Lew Dawes, [place] 10/31/18 1717

## 2018-10-31 NOTE — ED Triage Notes (Signed)
Patient reports a history of gout.  Today has pain in right foot that patient reports is gout.  Out of medication-patient requesting allopurinoll

## 2018-10-31 NOTE — Discharge Instructions (Addendum)
Please begin colchicine as prescribed May use indomethacin after finished with colchicine Begin allopurinol after pain/gout flare resolved.  Follow up with primary care for further refills of allopurinol in order to have blood work checked.

## 2019-08-20 ENCOUNTER — Encounter (HOSPITAL_COMMUNITY): Payer: Self-pay | Admitting: Emergency Medicine

## 2019-08-20 ENCOUNTER — Ambulatory Visit (HOSPITAL_COMMUNITY)
Admission: EM | Admit: 2019-08-20 | Discharge: 2019-08-20 | Disposition: A | Discharge status: Home / Self Care | Payer: Medicare Other | Attending: Family Medicine | Admitting: Family Medicine

## 2019-08-20 ENCOUNTER — Other Ambulatory Visit: Payer: Self-pay

## 2019-08-20 DIAGNOSIS — M109 Gout, unspecified: Secondary | ICD-10-CM

## 2019-08-20 MED ORDER — COLCHICINE 0.6 MG PO TABS: ORAL_TABLET | ORAL | 0 refills | Status: DC

## 2019-08-20 MED ORDER — INDOMETHACIN 50 MG PO CAPS: 50.0000 mg | ORAL_CAPSULE | Freq: Three times a day (TID) | ORAL | 0 refills | Status: DC

## 2019-08-20 NOTE — ED Triage Notes (Signed)
Patient is requesting a refill of gout medicine .  Patient is reporting gout in both feet.

## 2019-08-21 NOTE — ED Provider Notes (Signed)
[facility]   [id number] 08/20/19 Arrival Time: 1055  ASSESSMENT & PLAN:  1. Acute gout involving toe of left foot, unspecified cause     No signs of cellulitis/infection. Will treat as gout; unknown trigger.  To begin: Meds ordered this encounter  Medications  . indomethacin (INDOCIN) 50 MG capsule    Sig: Take 1 capsule (50 mg total) by mouth 3 (three) times daily with meals.    Dispense:  15 capsule    Refill:  0  . colchicine 0.6 MG tablet    Sig: Take two tablets as one dose followed one hour later by one tablet.    Dispense:  3 tablet    Refill:  0    Recommend: Follow-up Information    Charlott Rakes, MD.   Specialty: Family Medicine Why: If worsening or failing to improve as anticipated. Contact information: [place] [address] [phone]          OTC Tylenol if needed.  Reviewed expectations re: course of current medical issues. Questions answered. Outlined signs and symptoms indicating need for more acute intervention. Patient verbalized understanding. After Visit Summary given.  SUBJECTIVE: History from: patient. Peter Golden Sr. is a 68 y.o. male who reports fairly persistent marked pain of his left great toe; described as aching; without radiation. Onset: abrupt. First noted: approx 2 d ago. Injury/trama: no. Symptoms have gradually worsened since beginning. Aggravating factors: certain movements, weight bearing and prolonged walking/standing. Alleviating factors: have not been identified. Associated symptoms: none reported. Extremity sensation changes or weakness: none. Self treatment: acetaminophen without much relief. History of similar: yes, "feels like gout". Afebrile.  Past Surgical History:  Procedure Laterality Date  . BILATERAL SHOULDER ARTHROSCOPY    . CORONARY ARTERY BYPASS GRAFT N/A 05/01/2014   Procedure: CORONARY ARTERY BYPASS GRAFTING (CABG);  Surgeon: Gaye Pollack, MD;  Location:  [place];  Service: Open Heart Surgery;  Laterality: N/A;  Times 5 using left internal mammary artery and endoscopically harvested right saphenous vein  . CORONARY STENT PLACEMENT  2000, 2014  . INTRAOPERATIVE TRANSESOPHAGEAL ECHOCARDIOGRAM N/A 05/01/2014   Procedure: INTRAOPERATIVE TRANSESOPHAGEAL ECHOCARDIOGRAM;  Surgeon: Gaye Pollack, MD;  Location: [facility] OR;  Service: Open Heart Surgery;  Laterality: N/A;  . LEFT HEART CATHETERIZATION WITH CORONARY ANGIOGRAM N/A 07/29/2013   Procedure: LEFT HEART CATHETERIZATION WITH CORONARY ANGIOGRAM;  Surgeon: Clent Demark, MD;  Location: [place] CATH LAB;  Service: Cardiovascular;  Laterality: N/A;  . LEFT HEART CATHETERIZATION WITH CORONARY ANGIOGRAM N/A 04/24/2014   Procedure: LEFT HEART CATHETERIZATION WITH CORONARY ANGIOGRAM;  Surgeon: Burnell Blanks, MD;  Location: [facility] CATH LAB;  Service: Cardiovascular;  Laterality: N/A;  . PERCUTANEOUS STENT INTERVENTION  07/29/2013   Procedure: PERCUTANEOUS STENT INTERVENTION;  Surgeon: Clent Demark, MD;  Location: [place] CATH LAB;  Service: Cardiovascular;;     ROS: As per HPI. All other systems negative.    OBJECTIVE:  Vitals:   08/20/19 1127  BP: 118/69  Pulse: 67  Resp: (!) 24  Temp: 97.6 F (36.4 C)  TempSrc: Oral  SpO2: 94%    General appearance: alert; no distress HEENT: [place]; AT Neck: supple with FROM CV: RRR Resp: unlabored respirations Extremities: . LLE: warm with well perfused appearance; well localized moderate tenderness over left first MTP joint; without gross deformities; swelling: minimal; bruising: none; mild overlying erythema and warmth; great toe ROM: normal with reported discomfort CV: brisk extremity capillary refill of LLE; 2+ DP/PT pulses of LLE. Skin: warm  and dry; no visible rashes Neurologic: gait normal; normal reflexes of LLE; normal sensation of LLE; normal strength of LLE Psychological: alert and cooperative; normal mood and affect    Allergies  Allergen Reactions   . Codeine Itching    Past Medical History:  Diagnosis Date  . Arthritis    OSTEO  . Asthma   . Chronic systolic dysfunction of left ventricle   . Coronary artery disease    DES proximal LAD  . Diabetes (HCC)   . GERD (gastroesophageal reflux disease)   . Gout   . HTN (hypertension)   . Hyperlipemia   . Ischemic cardiomyopathy   . Myocardial infarction (HCC) 07/2013   anterolateral MI  . Right shoulder pain    chronically on pain medicine   Social History   Socioeconomic History  . Marital status: Divorced    Spouse name: Not on file  . Number of children: 3  . Years of education: Not on file  . Highest education level: Not on file  Occupational History  . Occupation: Disabled  Social Needs  . Financial resource strain: Not on file  . Food insecurity    Worry: Not on file    Inability: Not on file  . Transportation needs    Medical: Not on file    Non-medical: Not on file  Tobacco Use  . Smoking status: Former Smoker    Packs/day: 0.50    Years: 54.00    Pack years: 27.00    Types: Cigarettes  . Smokeless tobacco: Former Neurosurgeon    Types: Chew  Substance and Sexual Activity  . Alcohol use: Yes    Alcohol/week: 14.0 standard drinks    Types: 14 Standard drinks or equivalent per week    Comment: 2-3 beers per day  . Drug use: No  . Sexual activity: Not on file  Lifestyle  . Physical activity    Days per week: Not on file    Minutes per session: Not on file  . Stress: Not on file  Relationships  . Social Musician on phone: Not on file    Gets together: Not on file    Attends religious service: Not on file    Active member of club or organization: Not on file    Attends meetings of clubs or organizations: Not on file    Relationship status: Not on file  Other Topics Concern  . Not on file  Social History Narrative   Pt lives in [place] with daughter.  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   Family History  Problem Relation Age  of Onset  . Cancer Mother   . Stroke Mother   . CAD Father   . Heart attack Father 52  . Heart attack Brother 44       x 3  . Cancer Sister    Past Surgical History:  Procedure Laterality Date  . BILATERAL SHOULDER ARTHROSCOPY    . CORONARY ARTERY BYPASS GRAFT N/A 05/01/2014   Procedure: CORONARY ARTERY BYPASS GRAFTING (CABG);  Surgeon: Alleen Borne, MD;  Location: [facility] OR;  Service: Open Heart Surgery;  Laterality: N/A;  Times 5 using left internal mammary artery and endoscopically harvested right saphenous vein  . CORONARY STENT PLACEMENT  2000, 2014  . INTRAOPERATIVE TRANSESOPHAGEAL ECHOCARDIOGRAM N/A 05/01/2014   Procedure: INTRAOPERATIVE TRANSESOPHAGEAL ECHOCARDIOGRAM;  Surgeon: Alleen Borne, MD;  Location: [facility] OR;  Service: Open Heart Surgery;  Laterality: N/A;  . LEFT HEART CATHETERIZATION WITH  CORONARY ANGIOGRAM N/A 07/29/2013   Procedure: LEFT HEART CATHETERIZATION WITH CORONARY ANGIOGRAM;  Surgeon: Robynn Pane, MD;  Location: [facility] CATH LAB;  Service: Cardiovascular;  Laterality: N/A;  . LEFT HEART CATHETERIZATION WITH CORONARY ANGIOGRAM N/A 04/24/2014   Procedure: LEFT HEART CATHETERIZATION WITH CORONARY ANGIOGRAM;  Surgeon: Kathleene Hazel, MD;  Location: [facility] CATH LAB;  Service: Cardiovascular;  Laterality: N/A;  . PERCUTANEOUS STENT INTERVENTION  07/29/2013   Procedure: PERCUTANEOUS STENT INTERVENTION;  Surgeon: Robynn Pane, MD;  Location: [facility] CATH LAB;  Service: Cardiovascular;;      Mardella Layman, MD 08/21/19 [id number]

## 2020-01-13 ENCOUNTER — Ambulatory Visit (HOSPITAL_COMMUNITY)
Admission: EM | Admit: 2020-01-13 | Discharge: 2020-01-13 | Disposition: A | Discharge status: Home / Self Care | Payer: Medicare Other | Attending: Family Medicine | Admitting: Family Medicine

## 2020-01-13 ENCOUNTER — Encounter (HOSPITAL_COMMUNITY): Payer: Self-pay

## 2020-01-13 DIAGNOSIS — M109 Gout, unspecified: Secondary | ICD-10-CM | POA: Diagnosis not present

## 2020-01-13 DIAGNOSIS — Z760 Encounter for issue of repeat prescription: Secondary | ICD-10-CM

## 2020-01-13 MED ORDER — ALLOPURINOL 100 MG PO TABS: 100.0000 mg | ORAL_TABLET | Freq: Every day | ORAL | 0 refills | Status: AC

## 2020-01-13 MED ORDER — INDOMETHACIN 50 MG PO CAPS: 50.0000 mg | ORAL_CAPSULE | Freq: Three times a day (TID) | ORAL | 0 refills | Status: AC

## 2020-01-13 MED ORDER — COLCHICINE 0.6 MG PO TABS: ORAL_TABLET | ORAL | 0 refills | Status: AC

## 2020-01-13 NOTE — ED Triage Notes (Signed)
C/o bilateral great toe pain. Hx gout.

## 2020-01-13 NOTE — ED Provider Notes (Signed)
[facility]    CSN: [id number] Arrival date & time: 01/13/20  0940      History   Chief Complaint Chief Complaint  Patient presents with  . Gout    HPI Peter JOURDAN Sr. is a 69 y.o. male history of CAD, GERD, hypertension, gout, presenting today for evaluation of gout and medication refill.  Patient notes that over the past 1 to 2 weeks he has had pain in his bilateral toes.  Notes that this feels very similar to prior gout flares.  He has been out of his medicine for gout.  Reports that he previously was on allopurinol.  Typically uses colchicine and indomethacin for flares.  Denies any injury.  Denies fevers.  Denies history of diabetes.  Patient does not regularly follow-up with primary care.  Does see his cardiologist every 90 days and reports having blood work with him with normal kidney and liver function. On plavix. Has been using ibuprofen without relief.  HPI  Past Medical History:  Diagnosis Date  . Arthritis    OSTEO  . Asthma   . Chronic systolic dysfunction of left ventricle   . Coronary artery disease    DES proximal LAD  . Diabetes (HCC)   . GERD (gastroesophageal reflux disease)   . Gout   . HTN (hypertension)   . Hyperlipemia   . Ischemic cardiomyopathy   . Myocardial infarction (HCC) 07/2013   anterolateral MI  . Right shoulder pain    chronically on pain medicine    Patient Active Problem List   Diagnosis Date Noted  . Asthma 09/21/2016  . Prediabetes 09/21/2016  . Acute coronary syndrome (HCC) 01/09/2016  . Chest pain 01/09/2016  . Hyperlipidemia 05/29/2014  . Cellulitis 05/18/2014  . 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 At risk for sudden cardiac death, hx MI, EF 25%, Lt main disease 04/28/2014  . NSVT (nonsustained ventricular tachycardia) (HCC) 04/28/2014  . Unstable angina (HCC) 04/24/2014  . Coronary atherosclerosis of native coronary artery 11/29/2013  . HTN (hypertension) 11/29/2013  . GERD (gastroesophageal reflux disease)  11/29/2013  . 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 Cardiomyopathy, ischemic 11/29/2013    Past Surgical History:  Procedure Laterality Date  . BILATERAL SHOULDER ARTHROSCOPY    . CORONARY ARTERY BYPASS GRAFT N/A 05/01/2014   Procedure: CORONARY ARTERY BYPASS GRAFTING (CABG);  Surgeon: Alleen Borne, MD;  Location: [facility] OR;  Service: Open Heart Surgery;  Laterality: N/A;  Times 5 using left internal mammary artery and endoscopically harvested right saphenous vein  . CORONARY STENT PLACEMENT  2000, 2014  . INTRAOPERATIVE TRANSESOPHAGEAL ECHOCARDIOGRAM N/A 05/01/2014   Procedure: INTRAOPERATIVE TRANSESOPHAGEAL ECHOCARDIOGRAM;  Surgeon: Alleen Borne, MD;  Location[facility] OR;  Service: Open Heart Surgery;  Laterality: N/A;  . LEFT HEART CATHETERIZATION WITH CORONARY ANGIOGRAM N/A 07/29/2013   Procedure: LEFT HEART CATHETERIZATION WITH CORONARY ANGIOGRAM;  Surgeon: Robynn Pane, MD;  Location: [facility] CATH LAB;  Service: Cardiovascular;  Laterality: N/A;  . LEFT HEART CATHETERIZATION WITH CORONARY ANGIOGRAM N/A 04/24/2014   Procedure: LEFT HEART CATHETERIZATION WITH CORONARY ANGIOGRAM;  Surgeon: Kathleene Hazel, MD;  Location: [facility] CATH LAB;  Service: Cardiovascular;  Laterality: N/A;  . PERCUTANEOUS STENT INTERVENTION  07/29/2013   Procedure: PERCUTANEOUS STENT INTERVENTION;  Surgeon: Robynn Pane, MD;  Location: [facility] CATH [facility];  Service: Cardiovascular;;       Home Medications    Prior to Admission medications   Medication Sig Start Date End Date Taking? Authorizing Provider  albuterol (PROVENTIL) (2.5 MG/3ML) 0.083% nebulizer solution Take 3 mLs (2.5 mg total) by nebulization every 6 (six) hours as needed for wheezing or shortness of breath. 11/25/16   Tresa Garter, MD  allopurinol (ZYLOPRIM) 100 MG tablet Take 1 tablet (100 mg total) by mouth daily. Begin after flare resolved 01/13/20   Marshall Kampf C, PA-C  carvedilol (COREG) 3.125 MG tablet Take 1 tablet (3.125 mg total) by  mouth 2 (two) times daily with a meal. 06/03/14   Burnell Blanks, MD  clopidogrel (PLAVIX) 75 MG tablet Take 75 mg by mouth daily. Filled 12-30-15. For 30 days per pt's pharmacy    [provider]  colchicine 0.6 MG tablet Take two tablets as one dose followed one hour later by one tablet. May repeat Day 2. 01/13/20   Efraim Vanallen, Madelynn Done C, PA-C  esomeprazole (NEXIUM) 40 MG capsule Take 40 mg by mouth daily at 12 noon.    [provider]  fluticasone (FLONASE) 50 MCG/ACT nasal spray Place 2 sprays into both nostrils daily. 08/15/16   Tresa Garter, MD  Fluticasone-Salmeterol (ADVAIR DISKUS) 250-50 MCG/DOSE AEPB Inhale 1 puff into the lungs 2 (two) times daily. 01/18/17   Charlott Rakes, MD  furosemide (LASIX) 40 MG tablet Take 1 tablet (40 mg total) by mouth daily. 03/30/15   Everlene Balls, MD  indomethacin (INDOCIN) 50 MG capsule Take 1 capsule (50 mg total) by mouth 3 (three) times daily with meals. 01/13/20   Azizah Lisle C, PA-C  isosorbide mononitrate (IMDUR) 30 MG 24 hr tablet Take 1 tablet (30 mg total) by mouth daily. 01/10/16   Dixie Dials, MD  nitroGLYCERIN (NITROSTAT) 0.4 MG SL tablet Place 0.4 mg under the tongue every 5 (five) minutes as needed for chest pain.    [provider]  oxyCODONE-acetaminophen (PERCOCET) 10-325 MG tablet Take 1 tablet by mouth every 4 (four) hours as needed for pain.    [provider]  sacubitril-valsartan (ENTRESTO) 24-26 MG Take 1 tablet by mouth 2 (two) times daily.    [provider]  Omeprazole-Sodium Bicarbonate (ZEGERID) 20-1100 MG CAPS capsule Take 1 capsule by mouth daily before breakfast.  08/20/19  [provider]  pantoprazole (PROTONIX) 40 MG tablet Take 1 tablet (40 mg total) by mouth daily. 01/11/16 08/20/19  Dixie Dials, MD    Family History Family History  Problem Relation Age of Onset  . Cancer Mother   . Stroke Mother   . CAD Father   . Heart attack Father 75  . Heart  attack Brother 44       x 3  . Cancer Sister     Social History Social History   Tobacco Use  . Smoking status: Former Smoker    Packs/day: 0.50    Years: 54.00    Pack years: 27.00    Types: Cigarettes  . Smokeless tobacco: Former Systems developer    Types: Chew  Substance Use Topics  . Alcohol use: Yes    Alcohol/week: 14.0 standard drinks    Types: 14 Standard drinks or equivalent per week    Comment: 2-3 beers per day  . Drug use: No     Allergies   Codeine   Review of Systems Review of Systems  Constitutional: Negative for fatigue and fever.  Eyes: Negative for redness, itching and visual disturbance.  Respiratory: Negative for shortness of breath.   Cardiovascular: Negative for chest pain and leg swelling.  Gastrointestinal: Negative for nausea and vomiting.  Musculoskeletal: Positive for  arthralgias. Negative for myalgias.  Skin: Positive for color change. Negative for rash and wound.  Neurological: Negative for dizziness, syncope, weakness, light-headedness and headaches.     Physical Exam Triage Vital Signs ED Triage Vitals  Enc Vitals Group     BP 01/13/20 0853 131/68     Pulse Rate 01/13/20 0853 70     Resp 01/13/20 0853 18     Temp 01/13/20 0853 98.2 F (36.8 C)     Temp Source 01/13/20 0853 Oral     SpO2 01/13/20 0853 96 %     Weight 01/13/20 0851 245 lb (111.1 kg)     Height --      Head Circumference --      Peak Flow --      Pain Score 01/13/20 0851 9     Pain Loc --      Pain Edu? --      Excl. in GC? --    No data found.  Updated Vital Signs BP 131/68 (BP Location: Right Arm)   Pulse 70   Temp 98.2 F (36.8 C) (Oral)   Resp 18   Wt 245 lb (111.1 kg)   SpO2 96%   BMI 38.37 kg/m   Visual Acuity Right Eye Distance:   Left Eye Distance:   Bilateral Distance:    Right Eye Near:   Left Eye Near:    Bilateral Near:     Physical Exam Vitals and nursing note reviewed.  Constitutional:      Appearance: He is well-developed.      Comments: No acute distress  HENT:     Head: Normocephalic and atraumatic.     Nose: Nose normal.  Eyes:     Conjunctiva/sclera: Conjunctivae normal.  Cardiovascular:     Rate and Rhythm: Normal rate.  Pulmonary:     Effort: Pulmonary effort is normal. No respiratory distress.  Abdominal:     General: There is no distension.  Musculoskeletal:        General: Normal range of motion.     Cervical back: Neck supple.     Comments: Right great toe with erythema and slight warmth to interphalangeal joint, no significant swelling or redness noted MTP joint. Dorsalis pedis 2+  Skin:    General: Skin is warm and dry.  Neurological:     Mental Status: He is alert and oriented to person, place, and time.      UC Treatments / Results  Labs (all labs ordered are listed, but only abnormal results are displayed) Labs Reviewed - No data to display  EKG   Radiology No results found.  Procedures Procedures (including critical care time)  Medications Ordered in UC Medications - No data to display  Initial Impression / Assessment and Plan / UC Course  I have reviewed the triage vital signs and the nursing notes.  Pertinent labs & imaging results that were available during my care of the patient were reviewed by me and considered in my medical decision making (see chart for details).     Given history and feel similar to prior gout flares, will proceed with treatment for this, refilling colchicine and indomethacin.  Providing course of allopurinol.  Reports recent blood work with cardiologist which was normal.  Discussed importance of receiving refills from primary care instead of urgent care.  Discussed strict return precautions. Patient verbalized understanding and is agreeable with plan.  Final Clinical Impressions(s) / UC Diagnoses   Final diagnoses:  Acute gout involving toe  of left foot, unspecified cause  Acute gout involving toe of right foot, unspecified cause      Discharge Instructions     May begin colchicine as prescribed After finishing colchicine may use indomethacin as needed with food May restart allopurinol daily after gout flare resolves Continue to follow up with your primary care for regular follow ups/blood work Follow up if pain not improving or worsening   ED Prescriptions    Medication Sig Dispense Auth. Provider   colchicine 0.6 MG tablet Take two tablets as one dose followed one hour later by one tablet. May repeat Day 2. 6 tablet Kelda Azad C, PA-C   indomethacin (INDOCIN) 50 MG capsule Take 1 capsule (50 mg total) by mouth 3 (three) times daily with meals. 30 capsule Lindia Garms C, PA-C   allopurinol (ZYLOPRIM) 100 MG tablet Take 1 tablet (100 mg total) by mouth daily. Begin after flare resolved 90 tablet Ronnisha Felber, [place] C, PA-C     PDMP not reviewed this encounter.   Lew Dawes, [place] 01/13/20 [phone]

## 2020-01-13 NOTE — Discharge Instructions (Signed)
May begin colchicine as prescribed After finishing colchicine may use indomethacin as needed with food May restart allopurinol daily after gout flare resolves Continue to follow up with your primary care for regular follow ups/blood work Follow up if pain not improving or worsening

## 2020-03-17 DEATH — deceased
